# Patient Record
Sex: Female | Born: 1945
Health system: Southern US, Community
[De-identification: ages and names within clinical notes are randomized; demographics above are authoritative.]

## PROBLEM LIST (undated history)

## (undated) DIAGNOSIS — F32A Depression, unspecified: Secondary | ICD-10-CM

## (undated) DIAGNOSIS — T4145XA Adverse effect of unspecified anesthetic, initial encounter: Secondary | ICD-10-CM

## (undated) DIAGNOSIS — N952 Postmenopausal atrophic vaginitis: Secondary | ICD-10-CM

## (undated) DIAGNOSIS — Z780 Asymptomatic menopausal state: Secondary | ICD-10-CM

## (undated) DIAGNOSIS — K219 Gastro-esophageal reflux disease without esophagitis: Secondary | ICD-10-CM

## (undated) DIAGNOSIS — I209 Angina pectoris, unspecified: Secondary | ICD-10-CM

## (undated) DIAGNOSIS — R319 Hematuria, unspecified: Secondary | ICD-10-CM

## (undated) DIAGNOSIS — E785 Hyperlipidemia, unspecified: Secondary | ICD-10-CM

## (undated) DIAGNOSIS — F419 Anxiety disorder, unspecified: Secondary | ICD-10-CM

## (undated) DIAGNOSIS — Z972 Presence of dental prosthetic device (complete) (partial): Secondary | ICD-10-CM

## (undated) DIAGNOSIS — F329 Major depressive disorder, single episode, unspecified: Secondary | ICD-10-CM

## (undated) DIAGNOSIS — R3129 Other microscopic hematuria: Secondary | ICD-10-CM

## (undated) DIAGNOSIS — R0600 Dyspnea, unspecified: Secondary | ICD-10-CM

## (undated) DIAGNOSIS — I10 Essential (primary) hypertension: Secondary | ICD-10-CM

## (undated) DIAGNOSIS — T8859XA Other complications of anesthesia, initial encounter: Secondary | ICD-10-CM

## (undated) DIAGNOSIS — J309 Allergic rhinitis, unspecified: Secondary | ICD-10-CM

## (undated) HISTORY — PX: COLONOSCOPY: SHX174

## (undated) HISTORY — DX: Postmenopausal atrophic vaginitis: N95.2

## (undated) HISTORY — DX: Asymptomatic menopausal state: Z78.0

## (undated) HISTORY — DX: Allergic rhinitis, unspecified: J30.9

## (undated) HISTORY — DX: Depression, unspecified: F32.A

## (undated) HISTORY — PX: BREAST SURGERY: SHX581

## (undated) HISTORY — PX: TUMOR EXCISION: SHX421

## (undated) HISTORY — DX: Hyperlipidemia, unspecified: E78.5

## (undated) HISTORY — DX: Anxiety disorder, unspecified: F41.9

## (undated) HISTORY — DX: Essential (primary) hypertension: I10

## (undated) HISTORY — DX: Major depressive disorder, single episode, unspecified: F32.9

## (undated) HISTORY — PX: TONSILLECTOMY: SUR1361

## (undated) HISTORY — DX: Other microscopic hematuria: R31.29

---
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
## 2004-03-18 ENCOUNTER — Ambulatory Visit: Payer: Self-pay | Admitting: Gastroenterology

## 2005-04-23 ENCOUNTER — Ambulatory Visit: Payer: Self-pay | Admitting: Gastroenterology

## 2005-04-30 ENCOUNTER — Ambulatory Visit: Payer: Self-pay | Admitting: Gastroenterology

## 2005-05-01 ENCOUNTER — Ambulatory Visit: Payer: Self-pay | Admitting: Unknown Physician Specialty

## 2009-01-17 ENCOUNTER — Ambulatory Visit: Payer: Self-pay | Admitting: Internal Medicine

## 2009-01-28 ENCOUNTER — Ambulatory Visit: Payer: Self-pay | Admitting: Internal Medicine

## 2009-02-27 ENCOUNTER — Ambulatory Visit: Payer: Self-pay | Admitting: Internal Medicine

## 2011-05-12 DIAGNOSIS — R072 Precordial pain: Secondary | ICD-10-CM | POA: Diagnosis not present

## 2011-05-15 DIAGNOSIS — R0989 Other specified symptoms and signs involving the circulatory and respiratory systems: Secondary | ICD-10-CM | POA: Diagnosis not present

## 2011-05-15 DIAGNOSIS — R0789 Other chest pain: Secondary | ICD-10-CM | POA: Diagnosis not present

## 2011-05-15 DIAGNOSIS — R0609 Other forms of dyspnea: Secondary | ICD-10-CM | POA: Diagnosis not present

## 2011-07-14 DIAGNOSIS — J309 Allergic rhinitis, unspecified: Secondary | ICD-10-CM | POA: Diagnosis not present

## 2011-07-14 DIAGNOSIS — J069 Acute upper respiratory infection, unspecified: Secondary | ICD-10-CM | POA: Diagnosis not present

## 2011-09-18 DIAGNOSIS — F411 Generalized anxiety disorder: Secondary | ICD-10-CM | POA: Diagnosis not present

## 2011-09-18 DIAGNOSIS — N760 Acute vaginitis: Secondary | ICD-10-CM | POA: Diagnosis not present

## 2011-09-18 DIAGNOSIS — N390 Urinary tract infection, site not specified: Secondary | ICD-10-CM | POA: Diagnosis not present

## 2011-12-22 LAB — HM COLONOSCOPY

## 2012-01-13 DIAGNOSIS — L719 Rosacea, unspecified: Secondary | ICD-10-CM | POA: Diagnosis not present

## 2012-03-10 LAB — HM MAMMOGRAPHY

## 2012-07-28 DIAGNOSIS — L708 Other acne: Secondary | ICD-10-CM | POA: Diagnosis not present

## 2012-08-02 DIAGNOSIS — IMO0002 Reserved for concepts with insufficient information to code with codable children: Secondary | ICD-10-CM | POA: Diagnosis not present

## 2012-09-26 DIAGNOSIS — S83419A Sprain of medial collateral ligament of unspecified knee, initial encounter: Secondary | ICD-10-CM | POA: Diagnosis not present

## 2012-12-23 DIAGNOSIS — Z23 Encounter for immunization: Secondary | ICD-10-CM | POA: Diagnosis not present

## 2013-01-02 DIAGNOSIS — M2240 Chondromalacia patellae, unspecified knee: Secondary | ICD-10-CM | POA: Diagnosis not present

## 2013-01-02 DIAGNOSIS — IMO0002 Reserved for concepts with insufficient information to code with codable children: Secondary | ICD-10-CM | POA: Diagnosis not present

## 2013-01-02 DIAGNOSIS — S83419A Sprain of medial collateral ligament of unspecified knee, initial encounter: Secondary | ICD-10-CM | POA: Diagnosis not present

## 2013-01-17 ENCOUNTER — Ambulatory Visit: Payer: Self-pay | Admitting: Specialist

## 2013-01-17 DIAGNOSIS — M25569 Pain in unspecified knee: Secondary | ICD-10-CM | POA: Diagnosis not present

## 2013-01-19 DIAGNOSIS — IMO0002 Reserved for concepts with insufficient information to code with codable children: Secondary | ICD-10-CM | POA: Diagnosis not present

## 2013-01-19 DIAGNOSIS — M2240 Chondromalacia patellae, unspecified knee: Secondary | ICD-10-CM | POA: Diagnosis not present

## 2013-01-19 DIAGNOSIS — S83419A Sprain of medial collateral ligament of unspecified knee, initial encounter: Secondary | ICD-10-CM | POA: Diagnosis not present

## 2013-03-15 DIAGNOSIS — Z01419 Encounter for gynecological examination (general) (routine) without abnormal findings: Secondary | ICD-10-CM | POA: Diagnosis not present

## 2013-03-15 DIAGNOSIS — D709 Neutropenia, unspecified: Secondary | ICD-10-CM | POA: Diagnosis not present

## 2013-03-15 DIAGNOSIS — E785 Hyperlipidemia, unspecified: Secondary | ICD-10-CM | POA: Diagnosis not present

## 2013-03-15 DIAGNOSIS — Z23 Encounter for immunization: Secondary | ICD-10-CM | POA: Diagnosis not present

## 2013-03-15 DIAGNOSIS — N951 Menopausal and female climacteric states: Secondary | ICD-10-CM | POA: Diagnosis not present

## 2013-03-15 DIAGNOSIS — I10 Essential (primary) hypertension: Secondary | ICD-10-CM | POA: Diagnosis not present

## 2013-03-15 DIAGNOSIS — Z0000 Encounter for general adult medical examination without abnormal findings: Secondary | ICD-10-CM | POA: Diagnosis not present

## 2013-03-15 DIAGNOSIS — H1030 Unspecified acute conjunctivitis, unspecified eye: Secondary | ICD-10-CM | POA: Diagnosis not present

## 2013-03-15 DIAGNOSIS — F411 Generalized anxiety disorder: Secondary | ICD-10-CM | POA: Diagnosis not present

## 2013-03-15 DIAGNOSIS — Z1331 Encounter for screening for depression: Secondary | ICD-10-CM | POA: Diagnosis not present

## 2013-03-15 DIAGNOSIS — F329 Major depressive disorder, single episode, unspecified: Secondary | ICD-10-CM | POA: Diagnosis not present

## 2013-03-15 DIAGNOSIS — IMO0002 Reserved for concepts with insufficient information to code with codable children: Secondary | ICD-10-CM | POA: Diagnosis not present

## 2013-03-15 LAB — HM PAP SMEAR

## 2013-03-29 DIAGNOSIS — Z1231 Encounter for screening mammogram for malignant neoplasm of breast: Secondary | ICD-10-CM | POA: Diagnosis not present

## 2013-06-23 DIAGNOSIS — D1801 Hemangioma of skin and subcutaneous tissue: Secondary | ICD-10-CM | POA: Diagnosis not present

## 2013-06-23 DIAGNOSIS — L708 Other acne: Secondary | ICD-10-CM | POA: Diagnosis not present

## 2013-07-18 DIAGNOSIS — R002 Palpitations: Secondary | ICD-10-CM | POA: Diagnosis not present

## 2013-07-18 DIAGNOSIS — R0789 Other chest pain: Secondary | ICD-10-CM | POA: Diagnosis not present

## 2013-07-18 DIAGNOSIS — R0609 Other forms of dyspnea: Secondary | ICD-10-CM | POA: Diagnosis not present

## 2013-07-24 DIAGNOSIS — R002 Palpitations: Secondary | ICD-10-CM | POA: Diagnosis not present

## 2013-07-24 DIAGNOSIS — I4949 Other premature depolarization: Secondary | ICD-10-CM | POA: Diagnosis not present

## 2013-07-28 DIAGNOSIS — R21 Rash and other nonspecific skin eruption: Secondary | ICD-10-CM | POA: Diagnosis not present

## 2013-08-04 DIAGNOSIS — R310 Gross hematuria: Secondary | ICD-10-CM | POA: Diagnosis not present

## 2013-08-04 DIAGNOSIS — N760 Acute vaginitis: Secondary | ICD-10-CM | POA: Diagnosis not present

## 2013-08-04 DIAGNOSIS — N390 Urinary tract infection, site not specified: Secondary | ICD-10-CM | POA: Diagnosis not present

## 2013-08-28 DIAGNOSIS — G4700 Insomnia, unspecified: Secondary | ICD-10-CM | POA: Diagnosis not present

## 2013-08-28 DIAGNOSIS — N952 Postmenopausal atrophic vaginitis: Secondary | ICD-10-CM | POA: Diagnosis not present

## 2013-10-13 DIAGNOSIS — L821 Other seborrheic keratosis: Secondary | ICD-10-CM | POA: Diagnosis not present

## 2013-11-23 DIAGNOSIS — L0291 Cutaneous abscess, unspecified: Secondary | ICD-10-CM | POA: Diagnosis not present

## 2013-11-23 DIAGNOSIS — N979 Female infertility, unspecified: Secondary | ICD-10-CM | POA: Diagnosis not present

## 2013-11-23 DIAGNOSIS — L0390 Cellulitis, unspecified: Secondary | ICD-10-CM | POA: Diagnosis not present

## 2013-12-27 DIAGNOSIS — Z23 Encounter for immunization: Secondary | ICD-10-CM | POA: Diagnosis not present

## 2015-03-01 ENCOUNTER — Other Ambulatory Visit: Payer: Self-pay | Admitting: Unknown Physician Specialty

## 2015-03-01 ENCOUNTER — Encounter: Payer: Self-pay | Admitting: Unknown Physician Specialty

## 2015-03-01 ENCOUNTER — Ambulatory Visit (INDEPENDENT_AMBULATORY_CARE_PROVIDER_SITE_OTHER): Payer: Medicare Other | Admitting: Unknown Physician Specialty

## 2015-03-01 VITALS — BP 122/68 | HR 65 | Temp 98.3°F | Ht 67.5 in | Wt 159.6 lb

## 2015-03-01 DIAGNOSIS — F419 Anxiety disorder, unspecified: Secondary | ICD-10-CM

## 2015-03-01 DIAGNOSIS — I10 Essential (primary) hypertension: Secondary | ICD-10-CM | POA: Insufficient documentation

## 2015-03-01 DIAGNOSIS — F32A Depression, unspecified: Secondary | ICD-10-CM

## 2015-03-01 DIAGNOSIS — G4700 Insomnia, unspecified: Secondary | ICD-10-CM | POA: Insufficient documentation

## 2015-03-01 DIAGNOSIS — F329 Major depressive disorder, single episode, unspecified: Secondary | ICD-10-CM

## 2015-03-01 DIAGNOSIS — D709 Neutropenia, unspecified: Secondary | ICD-10-CM

## 2015-03-01 DIAGNOSIS — J309 Allergic rhinitis, unspecified: Secondary | ICD-10-CM | POA: Insufficient documentation

## 2015-03-01 DIAGNOSIS — R3129 Other microscopic hematuria: Secondary | ICD-10-CM | POA: Insufficient documentation

## 2015-03-01 DIAGNOSIS — E785 Hyperlipidemia, unspecified: Secondary | ICD-10-CM | POA: Insufficient documentation

## 2015-03-01 DIAGNOSIS — N952 Postmenopausal atrophic vaginitis: Secondary | ICD-10-CM | POA: Insufficient documentation

## 2015-03-01 DIAGNOSIS — J069 Acute upper respiratory infection, unspecified: Secondary | ICD-10-CM | POA: Diagnosis not present

## 2015-03-01 DIAGNOSIS — Z780 Asymptomatic menopausal state: Secondary | ICD-10-CM | POA: Insufficient documentation

## 2015-03-01 MED ORDER — FLUOXETINE HCL 10 MG PO CAPS: 30.0000 mg | ORAL_CAPSULE | Freq: Every day | ORAL | Status: DC

## 2015-03-01 NOTE — Patient Instructions (Signed)
Viral Infections °A viral infection can be caused by different types of viruses. Most viral infections are not serious and resolve on their own. However, some infections may cause severe symptoms and may lead to further complications. °SYMPTOMS °Viruses can frequently cause: °· Minor sore throat. °· Aches and pains. °· Headaches. °· Runny nose. °· Different types of rashes. °· Watery eyes. °· Tiredness. °· Cough. °· Loss of appetite. °· Gastrointestinal infections, resulting in nausea, vomiting, and diarrhea. °These symptoms do not respond to antibiotics because the infection is not caused by bacteria. However, you might catch a bacterial infection following the viral infection. This is sometimes called a "superinfection." Symptoms of such a bacterial infection may include: °· Worsening sore throat with pus and difficulty swallowing. °· Swollen neck glands. °· Chills and a high or persistent fever. °· Severe headache. °· Tenderness over the sinuses. °· Persistent overall ill feeling (malaise), muscle aches, and tiredness (fatigue). °· Persistent cough. °· Yellow, green, or brown mucus production with coughing. °HOME CARE INSTRUCTIONS  °· Only take over-the-counter or prescription medicines for pain, discomfort, diarrhea, or fever as directed by your caregiver. °· Drink enough water and fluids to keep your urine clear or pale yellow. Sports drinks can provide valuable electrolytes, sugars, and hydration. °· Get plenty of rest and maintain proper nutrition. Soups and broths with crackers or rice are fine. °SEEK IMMEDIATE MEDICAL CARE IF:  °· You have severe headaches, shortness of breath, chest pain, neck pain, or an unusual rash. °· You have uncontrolled vomiting, diarrhea, or you are unable to keep down fluids. °· You or your child has an oral temperature above 102° F (38.9° C), not controlled by medicine. °· Your baby is older than 3 months with a rectal temperature of 102° F (38.9° C) or higher. °· Your baby is 3  months old or younger with a rectal temperature of 100.4° F (38° C) or higher. °MAKE SURE YOU:  °· Understand these instructions. °· Will watch your condition. °· Will get help right away if you are not doing well or get worse. °  °This information is not intended to replace advice given to you by your health care provider. Make sure you discuss any questions you have with your health care provider. °  °Document Released: 12/24/2004 Document Revised: 06/08/2011 Document Reviewed: 08/22/2014 °Elsevier Interactive Patient Education ©2016 [employer] ° °

## 2015-03-01 NOTE — Assessment & Plan Note (Signed)
Stable.  Refill Fluoxetine

## 2015-03-01 NOTE — Progress Notes (Signed)
BP 122/68 mmHg  Pulse 65  Temp(Src) 98.3 F (36.8 C)  Ht 5' 7.5" (1.715 m)  Wt 159 lb 9.6 oz (72.394 kg)  BMI 24.61 kg/m2  SpO2 99%  LMP 06/30/1991 (Approximate)   Subjective:    Patient ID: Connie Morrison, female    DOB: Jan 25, 1946, 69 y.o.   MRN: [id number]  HPI: Connie Morrison is a 69 y.o. female  Chief Complaint  Patient presents with  . URI    pt states she has chest congestion and a productive cough that has been going on for about 30 days  . Medication Refill    pt states she needs refill on fluoxetine   URI  This is a new problem. Episode onset: 3 days. The problem has been gradually worsening. There has been no fever. Associated symptoms include congestion, coughing, headaches and neck pain. Pertinent negatives include no vomiting. She has tried nothing for the symptoms. The treatment provided no relief.   Depression Needs refill of Fluoxetine.   Depression screen PHQ 2/9 03/01/2015  Decreased Interest 0  Down, Depressed, Hopeless 0  PHQ - 2 Score 0     Relevant past medical, surgical, family and social history reviewed and updated as indicated. Interim medical history since our last visit reviewed. Allergies and medications reviewed and updated.  Review of Systems  HENT: Positive for congestion.   Respiratory: Positive for cough.   Gastrointestinal: Negative for vomiting.  Musculoskeletal: Positive for neck pain.  Neurological: Positive for headaches.    Per HPI unless specifically indicated above     Objective:    BP 122/68 mmHg  Pulse 65  Temp(Src) 98.3 F (36.8 C)  Ht 5' 7.5" (1.715 m)  Wt 159 lb 9.6 oz (72.394 kg)  BMI 24.61 kg/m2  SpO2 99%  LMP 06/30/1991 (Approximate)  Wt Readings from Last 3 Encounters:  03/01/15 159 lb 9.6 oz (72.394 kg)  07/13/14 161 lb (73.029 kg)    Physical Exam  Constitutional: She is oriented to person, place, and time. She appears well-developed and well-nourished. No distress.  HENT:  Head: Normocephalic and  atraumatic.  Right Ear: Tympanic membrane and ear canal normal.  Left Ear: Tympanic membrane and ear canal normal.  Nose: Rhinorrhea present. Right sinus exhibits no maxillary sinus tenderness and no frontal sinus tenderness. Left sinus exhibits no maxillary sinus tenderness and no frontal sinus tenderness.  Mouth/Throat: Mucous membranes are normal. Posterior oropharyngeal erythema present.  Eyes: Conjunctivae and lids are normal. Right eye exhibits no discharge. Left eye exhibits no discharge. No scleral icterus.  Cardiovascular: Normal rate and regular rhythm.   Pulmonary/Chest: Effort normal and breath sounds normal. No respiratory distress.  Abdominal: Normal appearance. There is no splenomegaly or hepatomegaly.  Musculoskeletal: Normal range of motion.  Neurological: She is alert and oriented to person, place, and time.  Skin: Skin is intact. No rash noted. No pallor.  Psychiatric: She has a normal mood and affect. Her behavior is normal. Judgment and thought content normal.      Assessment & Plan:   Problem List Items Addressed This Visit      Unprioritized   Depression    Stable.  Refill Fluoxetine      Relevant Medications   FLUoxetine (PROZAC) 10 MG capsule    Other Visit Diagnoses    URI (upper respiratory infection)    -  Primary        Follow up plan: Return for PE in January.

## 2015-05-27 ENCOUNTER — Encounter: Payer: Self-pay | Admitting: Family Medicine

## 2015-05-27 ENCOUNTER — Ambulatory Visit (INDEPENDENT_AMBULATORY_CARE_PROVIDER_SITE_OTHER): Payer: Medicare Other | Admitting: Family Medicine

## 2015-05-27 VITALS — BP 126/72 | HR 69 | Temp 98.2°F | Ht 66.4 in | Wt 155.0 lb

## 2015-05-27 DIAGNOSIS — J101 Influenza due to other identified influenza virus with other respiratory manifestations: Secondary | ICD-10-CM | POA: Diagnosis not present

## 2015-05-27 DIAGNOSIS — R509 Fever, unspecified: Secondary | ICD-10-CM | POA: Diagnosis not present

## 2015-05-27 LAB — INFLUENZA A AND B
INFLUENZA B AG, EIA: NEGATIVE
Influenza A Ag, EIA: POSITIVE — AB

## 2015-05-27 MED ORDER — BENZONATATE 200 MG PO CAPS: 200.0000 mg | ORAL_CAPSULE | Freq: Two times a day (BID) | ORAL | Status: DC | PRN

## 2015-05-27 MED ORDER — HYDROCOD POLST-CPM POLST ER 10-8 MG/5ML PO SUER: 5.0000 mL | Freq: Every evening | ORAL | Status: DC | PRN

## 2015-05-27 NOTE — Progress Notes (Signed)
BP 126/72 mmHg  Pulse 69  Temp(Src) 98.2 F (36.8 C)  Ht 5' 6.4" (1.687 m)  Wt 155 lb (70.308 kg)  BMI 24.70 kg/m2  SpO2 97%  LMP 06/30/1991 (Approximate)   Subjective:    Patient ID: Connie Morrison, female    DOB: [birth year], 70 y.o.   MRN: [id number]  HPI: CHRISTANY BELLER is a 70 y.o. female  Chief Complaint  Patient presents with  . Cough    X 6 days, fever, fatigue, and shortness of breath   UPPER RESPIRATORY TRACT INFECTION Duration: 6 days Worst symptom: tightness in chest and SOB Fever: yes Cough: yes Shortness of breath: yes Wheezing: yes Chest pain: no Chest tightness: yes Chest congestion: yes Nasal congestion: no Runny nose: no Post nasal drip: yes Sneezing: no Sore throat: no Swollen glands: no Sinus pressure: no Headache: yes Face pain: no Toothache: no Ear pain: no  Ear pressure: no  Eyes red/itching:no Eye drainage/crusting: no  Vomiting: no Rash: no Fatigue: yes Sick contacts: yes Strep contacts: no  Context: better Recurrent sinusitis: no Relief with OTC cold/cough medications: no  Treatments attempted: none   Relevant past medical, surgical, family and social history reviewed and updated as indicated. Interim medical history since our last visit reviewed. Allergies and medications reviewed and updated.  Review of Systems  Constitutional: Positive for fever, chills and fatigue. Negative for diaphoresis, activity change, appetite change and unexpected weight change.  HENT: Positive for congestion, rhinorrhea and sinus pressure. Negative for dental problem, drooling, ear discharge, ear pain, facial swelling, hearing loss, mouth sores, nosebleeds, postnasal drip, sneezing, sore throat, tinnitus and trouble swallowing.   Respiratory: Positive for cough, chest tightness, shortness of breath and wheezing. Negative for choking and stridor.   Cardiovascular: Negative.   Hematological: Negative.   Psychiatric/Behavioral: Negative.     Per HPI  unless specifically indicated above     Objective:    BP 126/72 mmHg  Pulse 69  Temp(Src) 98.2 F (36.8 C)  Ht 5' 6.4" (1.687 m)  Wt 155 lb (70.308 kg)  BMI 24.70 kg/m2  SpO2 97%  LMP 06/30/1991 (Approximate)  Wt Readings from Last 3 Encounters:  05/27/15 155 lb (70.308 kg)  03/01/15 159 lb 9.6 oz (72.394 kg)  07/13/14 161 lb (73.029 kg)    Physical Exam  Constitutional: She is oriented to person, place, and time. She appears well-developed and well-nourished. No distress.  HENT:  Head: Normocephalic and atraumatic.  Right Ear: Hearing and external ear normal.  Left Ear: Hearing and external ear normal.  Nose: Nose normal.  Mouth/Throat: Oropharynx is clear and moist. No oropharyngeal exudate.  Eyes: Conjunctivae, EOM and lids are normal. Pupils are equal, round, and reactive to light. Right eye exhibits no discharge. Left eye exhibits no discharge. No scleral icterus.  Neck: Normal range of motion. Neck supple. No JVD present. No tracheal deviation present. No thyromegaly present.  Cardiovascular: Normal rate, regular rhythm, normal heart sounds and intact distal pulses.  Exam reveals no gallop and no friction rub.   No murmur heard. Pulmonary/Chest: Effort normal and breath sounds normal. No stridor. No respiratory distress. She has no wheezes. She has no rales. She exhibits no tenderness.  Musculoskeletal: Normal range of motion.  Lymphadenopathy:    She has cervical adenopathy.  Neurological: She is alert and oriented to person, place, and time.  Skin: Skin is warm, dry and intact. No rash noted. She is not diaphoretic. No erythema. No pallor.  Psychiatric: She has a  normal mood and affect. Her speech is normal and behavior is normal. Judgment and thought content normal. Cognition and memory are normal.  Nursing note and vitals reviewed.   Results for orders placed or performed in visit on 03/01/15  HM MAMMOGRAPHY  Result Value Ref Range   HM Mammogram from PP   HM  PAP SMEAR  Result Value Ref Range   HM Pap smear from PP   HM COLONOSCOPY  Result Value Ref Range   HM Colonoscopy from PP       Assessment & Plan:   Problem List Items Addressed This Visit    None    Visit Diagnoses    Influenza A    -  Primary    Too late for tamiflu. No sign of pneumonia. Will treat with tussionex and tessalon perles. Monitor. Let us know if not getting better or getting worse.     Fever and chills        + for influenza A    Relevant Orders    Influenza a and b        Follow up plan: Return if symptoms worsen or fail to improve.

## 2015-07-11 ENCOUNTER — Telehealth: Payer: Self-pay | Admitting: Unknown Physician Specialty

## 2015-07-11 MED ORDER — FLUCONAZOLE 150 MG PO TABS: 150.0000 mg | ORAL_TABLET | Freq: Once | ORAL | Status: DC

## 2015-07-11 NOTE — Telephone Encounter (Signed)
Routing to provider  

## 2015-07-11 NOTE — Telephone Encounter (Signed)
Pt would like to have something called in to [facility] for a yeast infection.

## 2015-07-19 ENCOUNTER — Encounter: Payer: Self-pay | Admitting: Unknown Physician Specialty

## 2015-07-19 ENCOUNTER — Ambulatory Visit (INDEPENDENT_AMBULATORY_CARE_PROVIDER_SITE_OTHER): Payer: Medicare Other | Admitting: Unknown Physician Specialty

## 2015-07-19 VITALS — BP 118/68 | HR 62 | Temp 97.6°F | Ht 66.2 in | Wt 155.2 lb

## 2015-07-19 DIAGNOSIS — I10 Essential (primary) hypertension: Secondary | ICD-10-CM | POA: Diagnosis not present

## 2015-07-19 DIAGNOSIS — M542 Cervicalgia: Secondary | ICD-10-CM | POA: Diagnosis not present

## 2015-07-19 DIAGNOSIS — Z0000 Encounter for general adult medical examination without abnormal findings: Secondary | ICD-10-CM | POA: Diagnosis not present

## 2015-07-19 DIAGNOSIS — E785 Hyperlipidemia, unspecified: Secondary | ICD-10-CM | POA: Diagnosis not present

## 2015-07-19 DIAGNOSIS — Z1239 Encounter for other screening for malignant neoplasm of breast: Secondary | ICD-10-CM | POA: Diagnosis not present

## 2015-07-19 DIAGNOSIS — R3129 Other microscopic hematuria: Secondary | ICD-10-CM

## 2015-07-19 NOTE — Progress Notes (Addendum)
BP 118/68 mmHg  Pulse 62  Temp(Src) 97.6 F (36.4 C)  Ht 5' 6.2" (1.681 m)  Wt 155 lb 3.2 oz (70.398 kg)  BMI 24.91 kg/m2  SpO2 98%  LMP 06/30/1991 (Approximate)   Subjective:    Patient ID: Connie Morrison, female    DOB: 20-Oct-1945, 70 y.o.   MRN: [id number]  HPI: Connie Morrison is a 70 y.o. female  Chief Complaint  Patient presents with  . Medicare Wellness   Functional Status Survey: Is the patient deaf or have difficulty hearing?: No Does the patient have difficulty seeing, even when wearing glasses/contacts?: No Does the patient have difficulty concentrating, remembering, or making decisions?: No Does the patient have difficulty walking or climbing stairs?: No Does the patient have difficulty dressing or bathing?: No Does the patient have difficulty doing errands alone such as visiting a doctor's office or shopping?: No  Depression screen [facility] 2/9 07/19/2015 03/01/2015  Decreased Interest 0 0  Down, Depressed, Hopeless 0 0  PHQ - 2 Score 0 0    Family History  Problem Relation Age of Onset  . Heart disease Father   . Cancer Sister     lung  . Cancer Brother     colon  . Diabetes Brother   . Stroke Maternal Grandmother   . Cancer Maternal Grandfather     lung  . Cancer Sister     lung  . COPD Sister   . Heart disease Brother    Past Surgical History  Procedure Laterality Date  . Tumor excision Right     kidney  . Breast surgery      cyst removed  . Tonsillectomy     Neck pain Pt having neck pain for a while but worse in the past 2 months.  No arm pain.  13 treatments with chiropractor.  Wants to be referred to PT.   Hypertension Using medications without difficulty  No problems or lightheadedness No chest pain with exertion or shortness of breath No Edema   Hyperlipidemia On no medications No Muscle aches     Relevant past medical, surgical, family and social history reviewed and updated as indicated. Interim medical history since our last  visit reviewed. Allergies and medications reviewed and updated.  Review of Systems  Constitutional: Negative.   HENT: Negative.   Respiratory: Negative.   Cardiovascular: Negative.   Gastrointestinal: Negative.   Endocrine: Negative.   Genitourinary: Negative.  + Musculoskeletal: Negative.        Neck pain  Skin: Negative.   Allergic/Immunologic: Negative.   Neurological: Negative.   Hematological: Negative.   Psychiatric/Behavioral: Negative.     Per HPI unless specifically indicated above     Objective:    BP 118/68 mmHg  Pulse 62  Temp(Src) 97.6 F (36.4 C)  Ht 5' 6.2" (1.681 m)  Wt 155 lb 3.2 oz (70.398 kg)  BMI 24.91 kg/m2  SpO2 98%  LMP 06/30/1991 (Approximate)  Wt Readings from Last 3 Encounters:  07/19/15 155 lb 3.2 oz (70.398 kg)  05/27/15 155 lb (70.308 kg)  03/01/15 159 lb 9.6 oz (72.394 kg)    Physical Exam  Constitutional: She is oriented to person, place, and time. She appears well-developed and well-nourished.  HENT:  Head: Normocephalic and atraumatic.  Eyes: Pupils are equal, round, and reactive to light. Right eye exhibits no discharge. Left eye exhibits no discharge. No scleral icterus.  Neck: Normal carotid pulses present. Muscular tenderness present. No spinous process tenderness present. Carotid  bruit is not present. Decreased range of motion present. No thyromegaly present.  Cardiovascular: Normal rate, regular rhythm and normal heart sounds.  Exam reveals no gallop and no friction rub.   No murmur heard. Pulmonary/Chest: Effort normal and breath sounds normal. No respiratory distress. She has no wheezes. She has no rales.  Abdominal: Soft. Bowel sounds are normal. There is no tenderness. There is no rebound.  Genitourinary: No breast swelling, tenderness or discharge.  Lymphadenopathy:    She has no cervical adenopathy.  Neurological: She is alert and oriented to person, place, and time.  Skin: Skin is warm, dry and intact. No rash noted.   Psychiatric: She has a normal mood and affect. Her speech is normal and behavior is normal. Judgment and thought content normal. Cognition and memory are normal.       Assessment & Plan:   Problem List Items Addressed This Visit      Unprioritized   Hyperlipidemia   Relevant Orders   Lipid Panel w/o Chol/HDL Ratio   Hypertension    Stable, continue present medications.        Microscopic hematuria   Relevant Orders   Comprehensive metabolic panel   UA/M w/rflx Culture, Routine   CBC with Differential/Platelet    Other Visit Diagnoses    Health care maintenance    -  Primary    Relevant Orders    Hepatitis C antibody    Breast cancer screening        Relevant Orders    MM DIGITAL SCREENING BILATERAL    Neck pain        Relevant Orders    Ambulatory referral to Physical Therapy        Follow up plan: Return if symptoms worsen or fail to improve.

## 2015-07-19 NOTE — Assessment & Plan Note (Signed)
Stable, continue present medications.   

## 2015-07-20 LAB — COMPREHENSIVE METABOLIC PANEL
ALBUMIN: 4 g/dL (ref 3.6–4.8)
ALK PHOS: 62 IU/L (ref 39–117)
ALT: 13 IU/L (ref 0–32)
AST: 21 IU/L (ref 0–40)
Albumin/Globulin Ratio: 1.6 (ref 1.2–2.2)
BILIRUBIN TOTAL: 0.5 mg/dL (ref 0.0–1.2)
BUN / CREAT RATIO: 17 (ref 12–28)
BUN: 15 mg/dL (ref 8–27)
CHLORIDE: 102 mmol/L (ref 96–106)
CO2: 25 mmol/L (ref 18–29)
CREATININE: 0.87 mg/dL (ref 0.57–1.00)
Calcium: 9.2 mg/dL (ref 8.7–10.3)
GFR calc Af Amer: 79 mL/min/{1.73_m2} (ref >59)
GFR calc non Af Amer: 68 mL/min/{1.73_m2} (ref >59)
GLUCOSE: 89 mg/dL (ref 65–99)
Globulin, Total: 2.5 g/dL (ref 1.5–4.5)
Potassium: 4.2 mmol/L (ref 3.5–5.2)
Sodium: 144 mmol/L (ref 134–144)
Total Protein: 6.5 g/dL (ref 6.0–8.5)

## 2015-07-20 LAB — LIPID PANEL W/O CHOL/HDL RATIO
CHOLESTEROL TOTAL: 231 mg/dL — AB (ref 100–199)
HDL: 54 mg/dL (ref >39)
LDL CALC: 149 mg/dL — AB (ref 0–99)
Triglycerides: 141 mg/dL (ref 0–149)
VLDL CHOLESTEROL CAL: 28 mg/dL (ref 5–40)

## 2015-07-20 LAB — CBC WITH DIFFERENTIAL/PLATELET
BASOS ABS: 0 10*3/uL (ref 0.0–0.2)
Basos: 1 %
EOS (ABSOLUTE): 0.2 10*3/uL (ref 0.0–0.4)
Eos: 5 %
HEMOGLOBIN: 13 g/dL (ref 11.1–15.9)
Hematocrit: 40 % (ref 34.0–46.6)
IMMATURE GRANULOCYTES: 0 %
Immature Grans (Abs): 0 10*3/uL (ref 0.0–0.1)
LYMPHS ABS: 1.6 10*3/uL (ref 0.7–3.1)
Lymphs: 41 %
MCH: 29.9 pg (ref 26.6–33.0)
MCHC: 32.5 g/dL (ref 31.5–35.7)
MCV: 92 fL (ref 79–97)
MONOCYTES: 15 %
Monocytes Absolute: 0.6 10*3/uL (ref 0.1–0.9)
Neutrophils Absolute: 1.4 10*3/uL (ref 1.4–7.0)
Neutrophils: 38 %
Platelets: 218 10*3/uL (ref 150–379)
RBC: 4.35 x10E6/uL (ref 3.77–5.28)
RDW: 14.5 % (ref 12.3–15.4)
WBC: 3.8 10*3/uL (ref 3.4–10.8)

## 2015-07-20 LAB — HEPATITIS C ANTIBODY

## 2015-07-21 LAB — URINE CULTURE, REFLEX

## 2015-07-21 LAB — UA/M W/RFLX CULTURE, ROUTINE
BILIRUBIN UA: NEGATIVE
GLUCOSE, UA: NEGATIVE
Nitrite, UA: NEGATIVE
SPEC GRAV UA: 1.02 (ref 1.005–1.030)
Urobilinogen, Ur: 1 mg/dL (ref 0.2–1.0)
pH, UA: 6 (ref 5.0–7.5)

## 2015-07-21 LAB — MICROSCOPIC EXAMINATION

## 2015-07-22 ENCOUNTER — Telehealth: Payer: Self-pay | Admitting: Unknown Physician Specialty

## 2015-07-22 MED ORDER — ATORVASTATIN CALCIUM 10 MG PO TABS: 10.0000 mg | ORAL_TABLET | Freq: Every day | ORAL | Status: DC

## 2015-07-22 NOTE — Telephone Encounter (Signed)
Discussed pt cholesterol levels.  Results of ASCVD calculator recommends statins.  Start Atorvastatin 10 mg.  Recheck in 3 months

## 2015-07-24 ENCOUNTER — Telehealth: Payer: Self-pay | Admitting: Unknown Physician Specialty

## 2015-07-24 NOTE — Telephone Encounter (Signed)
Labs printed and put in mail to send out to patient.

## 2015-07-24 NOTE — Telephone Encounter (Signed)
Pt called stated she would like a copy of her lab work from her last appt mailed to her please. Thanks.

## 2015-08-08 ENCOUNTER — Telehealth: Payer: Self-pay | Admitting: Unknown Physician Specialty

## 2015-08-08 MED ORDER — SIMVASTATIN 20 MG PO TABS: 20.0000 mg | ORAL_TABLET | Freq: Every day | ORAL | Status: DC

## 2015-08-08 NOTE — Telephone Encounter (Signed)
Yes, written and sent

## 2015-08-08 NOTE — Telephone Encounter (Signed)
Called pt to schedule follow up, pt stated her lips start to swell and she is breaking out in a rash around the top lip. This is really uncomfortable, this has happened twice since she started the Lipitor. Please call pt back to discuss this ASAP. Thanks.

## 2015-08-08 NOTE — Telephone Encounter (Signed)
Called and spoke to patient. She stated that ever since she started the atorvastatin, her lips have swelled and had a rash around them. She stated this is very uncomfortable. She wants to know if she can be switched to something else like zocor.

## 2015-10-24 ENCOUNTER — Ambulatory Visit: Payer: Medicare Other | Admitting: Family Medicine

## 2015-10-24 ENCOUNTER — Other Ambulatory Visit: Payer: Self-pay

## 2015-11-25 ENCOUNTER — Telehealth: Payer: Self-pay

## 2015-11-25 NOTE — Telephone Encounter (Signed)
Called and scheduled patient an appointment for 12/27/15.

## 2015-11-25 NOTE — Telephone Encounter (Signed)
I would prefer a visit.  Thanks

## 2015-11-25 NOTE — Telephone Encounter (Signed)
Patient called in and stated that she needs to come in for labs since starting new cholesterol medication. Does patient need a lab appointment or an actual office visit?

## 2015-12-27 ENCOUNTER — Ambulatory Visit (INDEPENDENT_AMBULATORY_CARE_PROVIDER_SITE_OTHER): Payer: Medicare Other | Admitting: Unknown Physician Specialty

## 2015-12-27 ENCOUNTER — Encounter: Payer: Self-pay | Admitting: Unknown Physician Specialty

## 2015-12-27 VITALS — BP 125/70 | HR 58 | Temp 97.5°F | Ht 67.0 in | Wt 160.0 lb

## 2015-12-27 DIAGNOSIS — Z23 Encounter for immunization: Secondary | ICD-10-CM

## 2015-12-27 DIAGNOSIS — Z5181 Encounter for therapeutic drug level monitoring: Secondary | ICD-10-CM | POA: Diagnosis not present

## 2015-12-27 DIAGNOSIS — E785 Hyperlipidemia, unspecified: Secondary | ICD-10-CM

## 2015-12-27 LAB — LIPID PANEL PICCOLO, WAIVED
Chol/HDL Ratio Piccolo,Waive: 2.6 mg/dL
Cholesterol Piccolo, Waived: 143 mg/dL (ref <200)
HDL Chol Piccolo, Waived: 56 mg/dL — ABNORMAL LOW (ref >59)
LDL Chol Calc Piccolo Waived: 45 mg/dL (ref <100)
Triglycerides Piccolo,Waived: 214 mg/dL — ABNORMAL HIGH (ref <150)
VLDL CHOL CALC PICCOLO,WAIVE: 43 mg/dL — AB (ref <30)

## 2015-12-27 NOTE — Progress Notes (Signed)
BP 125/70 (BP Location: Left Arm, Patient Position: Sitting, Cuff Size: Normal)   Pulse (!) 58   Temp 97.5 F (36.4 C)   Ht 5\' 7"  (1.702 m)   Wt 160 lb (72.6 kg)   LMP 06/30/1991 (Approximate)   SpO2 97%   BMI 25.06 kg/m    Subjective:    Patient ID: Connie Morrison, female    DOB: November 14, 1945, 70 y.o.   MRN: [id number]  HPI: BIANCIA Morrison is a 70 y.o. female  Chief Complaint  Patient presents with  . Hyperlipidemia    Hyperlipidemia Using medications without problems: No Muscle aches  Diet compliance: eating oatmeal Exercise: Goes to the gym    Relevant past medical, surgical, family and social history reviewed and updated as indicated. Interim medical history since our last visit reviewed. Allergies and medications reviewed and updated.  Review of Systems  Per HPI unless specifically indicated above     Objective:    BP 125/70 (BP Location: Left Arm, Patient Position: Sitting, Cuff Size: Normal)   Pulse (!) 58   Temp 97.5 F (36.4 C)   Ht 5\' 7"  (1.702 m)   Wt 160 lb (72.6 kg)   LMP 06/30/1991 (Approximate)   SpO2 97%   BMI 25.06 kg/m   Wt Readings from Last 3 Encounters:  12/27/15 160 lb (72.6 kg)  07/19/15 155 lb 3.2 oz (70.4 kg)  05/27/15 155 lb (70.3 kg)    Physical Exam  Constitutional: She is oriented to person, place, and time. She appears well-developed and well-nourished. No distress.  HENT:  Head: Normocephalic and atraumatic.  Eyes: Conjunctivae and lids are normal. Right eye exhibits no discharge. Left eye exhibits no discharge. No scleral icterus.  Neck: Normal range of motion. Neck supple. No JVD present. Carotid bruit is not present.  Cardiovascular: Normal rate, regular rhythm and normal heart sounds.   Pulmonary/Chest: Effort normal and breath sounds normal.  Abdominal: Normal appearance. There is no splenomegaly or hepatomegaly.  Musculoskeletal: Normal range of motion.  Neurological: She is alert and oriented to person, place, and  time.  Skin: Skin is warm, dry and intact. No rash noted. No pallor.  Psychiatric: She has a normal mood and affect. Her behavior is normal. Judgment and thought content normal.    Results for orders placed or performed in visit on 07/19/15  Microscopic Examination  Result Value Ref Range   WBC, UA 0-5 0 - 5 /hpf   RBC, UA 3-10 (A) 0 - 2 /hpf   Epithelial Cells (non renal) 0-10 0 - 10 /hpf   Mucus, UA Present Not Estab.   Bacteria, UA Few None seen/Few  Hepatitis C antibody  Result Value Ref Range   Hep C Virus Ab <0.1 0.0 - 0.9 s/co ratio  Lipid Panel w/o Chol/HDL Ratio  Result Value Ref Range   Cholesterol, Total 231 (H) 100 - 199 mg/dL   Triglycerides 141 0 - 149 mg/dL   HDL 54 >39 mg/dL   VLDL Cholesterol Cal 28 5 - 40 mg/dL   LDL Calculated 149 (H) 0 - 99 mg/dL  Comprehensive metabolic panel  Result Value Ref Range   Glucose 89 65 - 99 mg/dL   BUN 15 8 - 27 mg/dL   Creatinine, Ser 0.87 0.57 - 1.00 mg/dL   GFR calc non Af Amer 68 >59 mL/min/1.73   GFR calc Af Amer 79 >59 mL/min/1.73   BUN/Creatinine Ratio 17 12 - 28   Sodium 144 134 - 144  mmol/L   Potassium 4.2 3.5 - 5.2 mmol/L   Chloride 102 96 - 106 mmol/L   CO2 25 18 - 29 mmol/L   Calcium 9.2 8.7 - 10.3 mg/dL   Total Protein 6.5 6.0 - 8.5 g/dL   Albumin 4.0 3.6 - 4.8 g/dL   Globulin, Total 2.5 1.5 - 4.5 g/dL   Albumin/Globulin Ratio 1.6 1.2 - 2.2   Bilirubin Total 0.5 0.0 - 1.2 mg/dL   Alkaline Phosphatase 62 39 - 117 IU/L   AST 21 0 - 40 IU/L   ALT 13 0 - 32 IU/L  UA/M w/rflx Culture, Routine  Result Value Ref Range   Specific Gravity, UA 1.020 1.005 - 1.030   pH, UA 6.0 5.0 - 7.5   Color, UA Yellow Yellow   Appearance Ur Clear Clear   Leukocytes, UA Trace (A) Negative   Protein, UA 1+ (A) Negative/Trace   Glucose, UA Negative Negative   Ketones, UA Trace (A) Negative   RBC, UA 3+ (A) Negative   Bilirubin, UA Negative Negative   Urobilinogen, Ur 1.0 0.2 - 1.0 mg/dL   Nitrite, UA Negative Negative    Microscopic Examination See below:    Urinalysis Reflex Comment   CBC with Differential/Platelet  Result Value Ref Range   WBC 3.8 3.4 - 10.8 x10E3/uL   RBC 4.35 3.77 - 5.28 x10E6/uL   Hemoglobin 13.0 11.1 - 15.9 g/dL   Hematocrit 40.0 34.0 - 46.6 %   MCV 92 79 - 97 fL   MCH 29.9 26.6 - 33.0 pg   MCHC 32.5 31.5 - 35.7 g/dL   RDW 14.5 12.3 - 15.4 %   Platelets 218 150 - 379 x10E3/uL   Neutrophils 38 %   Lymphs 41 %   Monocytes 15 %   Eos 5 %   Basos 1 %   Neutrophils Absolute 1.4 1.4 - 7.0 x10E3/uL   Lymphocytes Absolute 1.6 0.7 - 3.1 x10E3/uL   Monocytes Absolute 0.6 0.1 - 0.9 x10E3/uL   EOS (ABSOLUTE) 0.2 0.0 - 0.4 x10E3/uL   Basophils Absolute 0.0 0.0 - 0.2 x10E3/uL   Immature Granulocytes 0 %   Immature Grans (Abs) 0.0 0.0 - 0.1 x10E3/uL  Urine Culture, Routine  Result Value Ref Range   Urine Culture, Routine Final report    Urine Culture result 1 Comment       Assessment & Plan:   Problem List Items Addressed This Visit      Unprioritized   Hyperlipidemia    LDL is 45.  Continue present medications but break in half for 10 mg      Relevant Orders   Comprehensive metabolic panel   Lipid Panel [place], [place]    Other Visit Diagnoses    Need for influenza vaccination    -  Primary   Relevant Orders   Flu vaccine HIGH DOSE PF (Completed)   Medication monitoring encounter           Follow up plan: Return in about 6 months (around 06/25/2016) for physical.

## 2015-12-27 NOTE — Assessment & Plan Note (Signed)
LDL is 45.  Continue present medications but break in half for 10 mg

## 2015-12-27 NOTE — Patient Instructions (Signed)

## 2015-12-28 LAB — COMPREHENSIVE METABOLIC PANEL
A/G RATIO: 1.7 (ref 1.2–2.2)
ALBUMIN: 3.8 g/dL (ref 3.5–4.8)
ALK PHOS: 59 IU/L (ref 39–117)
ALT: 16 IU/L (ref 0–32)
AST: 19 IU/L (ref 0–40)
BILIRUBIN TOTAL: 0.3 mg/dL (ref 0.0–1.2)
BUN / CREAT RATIO: 21 (ref 12–28)
BUN: 16 mg/dL (ref 8–27)
CHLORIDE: 104 mmol/L (ref 96–106)
CO2: 29 mmol/L (ref 18–29)
Calcium: 8.7 mg/dL (ref 8.7–10.3)
Creatinine, Ser: 0.78 mg/dL (ref 0.57–1.00)
GFR calc Af Amer: 89 mL/min/{1.73_m2} (ref >59)
GFR calc non Af Amer: 77 mL/min/{1.73_m2} (ref >59)
GLOBULIN, TOTAL: 2.3 g/dL (ref 1.5–4.5)
Glucose: 87 mg/dL (ref 65–99)
POTASSIUM: 4.1 mmol/L (ref 3.5–5.2)
SODIUM: 144 mmol/L (ref 134–144)
Total Protein: 6.1 g/dL (ref 6.0–8.5)

## 2015-12-30 ENCOUNTER — Encounter: Payer: Self-pay | Admitting: Unknown Physician Specialty

## 2016-03-10 ENCOUNTER — Ambulatory Visit: Payer: Medicare Other | Admitting: Unknown Physician Specialty

## 2016-03-31 ENCOUNTER — Telehealth: Payer: Self-pay

## 2016-03-31 MED ORDER — FLUOXETINE HCL 10 MG PO CAPS: 30.0000 mg | ORAL_CAPSULE | Freq: Every day | ORAL | 12 refills | Status: DC

## 2016-03-31 NOTE — Telephone Encounter (Signed)
Received a refill for patient's fluoxetine. Current rx has 2 different directions on it so I called the patient to clarify. She stated that the prescription is written for 3 capsules daily but she sometimes only takes 2 per day. Malachy Mood, can we fix the rx to say only the 3 capsules per dau and send it in for the patient?

## 2016-04-21 ENCOUNTER — Telehealth: Payer: Self-pay | Admitting: Unknown Physician Specialty

## 2016-04-21 DIAGNOSIS — E2839 Other primary ovarian failure: Secondary | ICD-10-CM

## 2016-04-21 NOTE — Telephone Encounter (Signed)
Routing to provider for order. Please route back to me so I can schedule patient's appointment.

## 2016-04-21 NOTE — Telephone Encounter (Signed)
Pt would like to have a bone density scan Feb or Mar preferred afternoon.

## 2016-04-21 NOTE — Telephone Encounter (Signed)
Called and spoke with patient. I let her know that I was going to call and schedule her bone density for her first thing in the morning being that it is now after 5. I let her know that I would call her tomorrow with her appointment.

## 2016-04-22 NOTE — Telephone Encounter (Signed)
Called and let patient know about appointment. Also gave the patient [facility] number incase she needed to change her appointment.

## 2016-04-22 NOTE — Telephone Encounter (Signed)
Called [facility] and scheduled patient's bone density for March 8th at 2:20 pm. Will call and notify patient.

## 2016-06-04 ENCOUNTER — Ambulatory Visit
Admission: RE | Admit: 2016-06-04 | Discharge: 2016-06-04 | Disposition: A | Discharge status: Home / Self Care | Payer: Medicare Other | Source: Ambulatory Visit | Attending: Unknown Physician Specialty | Admitting: Unknown Physician Specialty

## 2016-06-04 DIAGNOSIS — E2839 Other primary ovarian failure: Secondary | ICD-10-CM | POA: Diagnosis present

## 2016-06-04 DIAGNOSIS — M85851 Other specified disorders of bone density and structure, right thigh: Secondary | ICD-10-CM | POA: Insufficient documentation

## 2016-06-05 ENCOUNTER — Encounter: Payer: Self-pay | Admitting: Unknown Physician Specialty

## 2016-06-05 NOTE — Progress Notes (Signed)
Normal labs.  Pt notified by letter

## 2016-07-24 ENCOUNTER — Encounter: Payer: Self-pay | Admitting: Unknown Physician Specialty

## 2016-07-24 ENCOUNTER — Ambulatory Visit (INDEPENDENT_AMBULATORY_CARE_PROVIDER_SITE_OTHER): Payer: Medicare Other | Admitting: Unknown Physician Specialty

## 2016-07-24 VITALS — BP 122/69 | HR 62 | Temp 97.8°F | Ht 66.2 in | Wt 158.2 lb

## 2016-07-24 DIAGNOSIS — Z0000 Encounter for general adult medical examination without abnormal findings: Secondary | ICD-10-CM

## 2016-07-24 DIAGNOSIS — K219 Gastro-esophageal reflux disease without esophagitis: Secondary | ICD-10-CM | POA: Diagnosis not present

## 2016-07-24 DIAGNOSIS — M542 Cervicalgia: Secondary | ICD-10-CM

## 2016-07-24 DIAGNOSIS — Z0001 Encounter for general adult medical examination with abnormal findings: Secondary | ICD-10-CM | POA: Diagnosis not present

## 2016-07-24 DIAGNOSIS — I10 Essential (primary) hypertension: Secondary | ICD-10-CM | POA: Diagnosis not present

## 2016-07-24 DIAGNOSIS — E7800 Pure hypercholesterolemia, unspecified: Secondary | ICD-10-CM

## 2016-07-24 DIAGNOSIS — F325 Major depressive disorder, single episode, in full remission: Secondary | ICD-10-CM | POA: Diagnosis not present

## 2016-07-24 DIAGNOSIS — Z1231 Encounter for screening mammogram for malignant neoplasm of breast: Secondary | ICD-10-CM | POA: Diagnosis not present

## 2016-07-24 DIAGNOSIS — Z7189 Other specified counseling: Secondary | ICD-10-CM

## 2016-07-24 MED ORDER — OMEPRAZOLE 20 MG PO CPDR: 20.0000 mg | DELAYED_RELEASE_CAPSULE | Freq: Every day | ORAL | 3 refills | Status: DC

## 2016-07-24 NOTE — Assessment & Plan Note (Signed)
Stable, continue present medications.   

## 2016-07-24 NOTE — Assessment & Plan Note (Signed)
New problem.  Start Omeprazole.

## 2016-07-24 NOTE — Assessment & Plan Note (Signed)
A voluntary discussion about advance care planning including the explanation and discussion of advance directives was extensively discussed  with the patient.  Explanation about the health care proxy and Living will was reviewed.  She has done the forms and does not want to make changes.  She identifies her health care proxy as her husband.

## 2016-07-24 NOTE — Progress Notes (Signed)
BP 122/69   Pulse 62   Temp 97.8 F (36.6 C)   Ht 5' 6.2" (1.681 m)   Wt 158 lb 3.2 oz (71.8 kg)   LMP 06/30/1991 (Approximate)   SpO2 99%   BMI 25.38 kg/m    Subjective:    Patient ID: Connie Morrison, female    DOB: 1945/08/28, 71 y.o.   MRN: [id number]  HPI: Connie Morrison is a 71 y.o. female  Chief Complaint  Patient presents with  . Medicare Wellness   GERD States she needs something for acid indigestion.  States she gets food coming up into her throat.  Uses OTC Alka-Seltzer.  Husband takes Omeprazole.    Hypertension Using medications without difficulty Average home BPs   No problems or lightheadedness No chest pain with exertion or shortness of breath No Edema  Hyperlipidemia Using medications without problems: No Muscle aches  Diet compliance: Exercise:  Neck pain Pt with long-standing neck pain.  She would like to go to PT as previously recommended.    Depression Doing well with present medications.   Depression screen [facility] 2/9 07/24/2016 07/19/2015 03/01/2015  Decreased Interest 0 0 0  Down, Depressed, Hopeless 0 0 0  PHQ - 2 Score 0 0 0  Altered sleeping 1 - -  Tired, decreased energy 1 - -  Change in appetite 1 - -  Feeling bad or failure about yourself  0 - -  Trouble concentrating 0 - -  Moving slowly or fidgety/restless 0 - -  Suicidal thoughts 0 - -  PHQ-9 Score 3 - -   Functional Status Survey: Is the patient deaf or have difficulty hearing?: No Does the patient have difficulty seeing, even when wearing glasses/contacts?: No Does the patient have difficulty concentrating, remembering, or making decisions?: No Does the patient have difficulty walking or climbing stairs?: No Does the patient have difficulty dressing or bathing?: No Does the patient have difficulty doing errands alone such as visiting a doctor's office or shopping?: No Fall Risk  07/24/2016 07/19/2015  Falls in the past year? No No   Social History   Social History  .  Marital status: Married    Spouse name: N/A  . Number of children: N/A  . Years of education: N/A   Occupational History  . Not on file.   Social History Main Topics  . Smoking status: Former Research scientist (life sciences)  . Smokeless tobacco: Never Used  . Alcohol use No  . Drug use: No  . Sexual activity: Yes   Other Topics Concern  . Not on file   Social History Narrative  . No narrative on file   Family History  Problem Relation Age of Onset  . Heart disease Father   . Cancer Sister     lung  . Cancer Brother     colon  . Diabetes Brother   . Stroke Maternal Grandmother   . Cancer Maternal Grandfather     lung  . Cancer Sister     lung  . COPD Sister   . Heart disease Brother    Past Medical History:  Diagnosis Date  . Allergic rhinitis   . Anxiety   . Depression   . Hyperlipidemia   . Hypertension   . Menopause   . Microscopic hematuria   . Vaginal atrophy    Past Surgical History:  Procedure Laterality Date  . BREAST SURGERY     cyst removed  . TONSILLECTOMY    . TUMOR EXCISION Right  kidney    Relevant past medical, surgical, family and social history reviewed and updated as indicated. Interim medical history since our last visit reviewed. Allergies and medications reviewed and updated.  Review of Systems  Constitutional: Negative.   HENT: Negative.   Eyes: Negative.   Respiratory: Negative.   Cardiovascular: Negative.   Endocrine: Negative.   Genitourinary: Negative.   Musculoskeletal: Negative.   Skin: Negative.   Allergic/Immunologic: Negative.   Neurological: Negative.   Hematological: Negative.   Psychiatric/Behavioral: Negative.     Per HPI unless specifically indicated above     Objective:    BP 122/69   Pulse 62   Temp 97.8 F (36.6 C)   Ht 5' 6.2" (1.681 m)   Wt 158 lb 3.2 oz (71.8 kg)   LMP 06/30/1991 (Approximate)   SpO2 99%   BMI 25.38 kg/m   Wt Readings from Last 3 Encounters:  07/24/16 158 lb 3.2 oz (71.8 kg)  12/27/15 160  lb (72.6 kg)  07/19/15 155 lb 3.2 oz (70.4 kg)    Physical Exam  Constitutional: She is oriented to person, place, and time. She appears well-developed and well-nourished.  HENT:  Head: Normocephalic and atraumatic.  Eyes: Pupils are equal, round, and reactive to light. Right eye exhibits no discharge. Left eye exhibits no discharge. No scleral icterus.  Neck: Normal range of motion. Neck supple. Carotid bruit is not present. No thyromegaly present.  Cardiovascular: Normal rate, regular rhythm and normal heart sounds.  Exam reveals no gallop and no friction rub.   No murmur heard. Pulmonary/Chest: Effort normal and breath sounds normal. No respiratory distress. She has no wheezes. She has no rales.  Abdominal: Soft. Bowel sounds are normal. There is no tenderness. There is no rebound.  Genitourinary: No breast swelling, tenderness or discharge.  Musculoskeletal: Normal range of motion.  Lymphadenopathy:    She has no cervical adenopathy.  Neurological: She is alert and oriented to person, place, and time.  Skin: Skin is warm, dry and intact. No rash noted.  Psychiatric: She has a normal mood and affect. Her speech is normal and behavior is normal. Judgment and thought content normal. Cognition and memory are normal.    Results for orders placed or performed in visit on 12/27/15  Comprehensive metabolic panel  Result Value Ref Range   Glucose 87 65 - 99 mg/dL   BUN 16 8 - 27 mg/dL   Creatinine, Ser 0.78 0.57 - 1.00 mg/dL   GFR calc non Af Amer 77 >59 mL/min/1.73   GFR calc Af Amer 89 >59 mL/min/1.73   BUN/Creatinine Ratio 21 12 - 28   Sodium 144 134 - 144 mmol/L   Potassium 4.1 3.5 - 5.2 mmol/L   Chloride 104 96 - 106 mmol/L   CO2 29 18 - 29 mmol/L   Calcium 8.7 8.7 - 10.3 mg/dL   Total Protein 6.1 6.0 - 8.5 g/dL   Albumin 3.8 3.5 - 4.8 g/dL   Globulin, Total 2.3 1.5 - 4.5 g/dL   Albumin/Globulin Ratio 1.7 1.2 - 2.2   Bilirubin Total 0.3 0.0 - 1.2 mg/dL   Alkaline Phosphatase  59 39 - 117 IU/L   AST 19 0 - 40 IU/L   ALT 16 0 - 32 IU/L  Lipid Panel Piccolo, Waived  Result Value Ref Range   Cholesterol Piccolo, Waived 143 <200 mg/dL   HDL Chol Piccolo, Waived 56 (L) >59 mg/dL   Triglycerides Piccolo,Waived 214 (H) <150 mg/dL   Chol/HDL Ratio Piccolo,Waive 2.6  mg/dL   LDL Chol Calc Piccolo Waived 45 <100 mg/dL   VLDL Chol Calc Piccolo,Waive 43 (H) <30 mg/dL      Assessment & Plan:   Problem List Items Addressed This Visit      Unprioritized   Advance care planning    A voluntary discussion about advance care planning including the explanation and discussion of advance directives was extensively discussed  with the patient.  Explanation about the health care proxy and Living will was reviewed.  She has done the forms and does not want to make changes.  She identifies her health care proxy as her husband.        Depression    Stable, continue present medications.        GERD (gastroesophageal reflux disease)    New problem.  Start Omeprazole.        Relevant Medications   omeprazole (PRILOSEC) 20 MG capsule   Hyperlipidemia    Stable, continue present medications.        Hypertension    Stable, continue present medications.        Relevant Orders   Comprehensive metabolic panel   Lipid Panel w/o Chol/HDL Ratio   Neck pain    Persistent problem.  Refer to Physical Therapy      Relevant Orders   Ambulatory referral to Physical Therapy    Other Visit Diagnoses    Annual physical exam    -  Primary   Encounter for screening mammogram for breast cancer           Follow up plan: Return in about 6 months (around 01/23/2017).

## 2016-07-24 NOTE — Patient Instructions (Addendum)
Gastroesophageal Reflux Disease, Adult Normally, food travels down the esophagus and stays in the stomach to be digested. However, when a person has gastroesophageal reflux disease (GERD), food and stomach acid move back up into the esophagus. When this happens, the esophagus becomes sore and inflamed. Over time, GERD can create small holes (ulcers) in the lining of the esophagus. What are the causes? This condition is caused by a problem with the muscle between the esophagus and the stomach (lower esophageal sphincter, or LES). Normally, the LES muscle closes after food passes through the esophagus to the stomach. When the LES is weakened or abnormal, it does not close properly, and that allows food and stomach acid to go back up into the esophagus. The LES can be weakened by certain dietary substances, medicines, and medical conditions, including:  Tobacco use.  Pregnancy.  Having a hiatal hernia.  Heavy alcohol use.  Certain foods and beverages, such as coffee, chocolate, onions, and peppermint. What increases the risk? This condition is more likely to develop in:  People who have an increased body weight.  People who have connective tissue disorders.  People who use NSAID medicines. What are the signs or symptoms? Symptoms of this condition include:  Heartburn.  Difficult or painful swallowing.  The feeling of having a lump in the throat.  Abitter taste in the mouth.  Bad breath.  Having a large amount of saliva.  Having an upset or bloated stomach.  Belching.  Chest pain.  Shortness of breath or wheezing.  Ongoing (chronic) cough or a night-time cough.  Wearing away of tooth enamel.  Weight loss. Different conditions can cause chest pain. Make sure to see your health care provider if you experience chest pain. How is this diagnosed? Your health care provider will take a medical history and perform a physical exam. To determine if you have mild or severe GERD,  your health care provider may also monitor how you respond to treatment. You may also have other tests, including:  An endoscopy toexamine your stomach and esophagus with a small camera.  A test thatmeasures the acidity level in your esophagus.  A test thatmeasures how much pressure is on your esophagus.  A barium swallow or modified barium swallow to show the shape, size, and functioning of your esophagus. How is this treated? The goal of treatment is to help relieve your symptoms and to prevent complications. Treatment for this condition may vary depending on how severe your symptoms are. Your health care provider may recommend:  Changes to your diet.  Medicine.  Surgery. Follow these instructions at home: Diet   Follow a diet as recommended by your health care provider. This may involve avoiding foods and drinks such as:  Coffee and tea (with or without caffeine).  Drinks that containalcohol.  Energy drinks and sports drinks.  Carbonated drinks or sodas.  Chocolate and cocoa.  Peppermint and mint flavorings.  Garlic and onions.  Horseradish.  Spicy and acidic foods, including peppers, chili powder, curry powder, vinegar, hot sauces, and barbecue sauce.  Citrus fruit juices and citrus fruits, such as oranges, lemons, and limes.  Tomato-based foods, such as red sauce, chili, salsa, and pizza with red sauce.  Fried and fatty foods, such as donuts, french fries, potato chips, and high-fat dressings.  High-fat meats, such as hot dogs and fatty cuts of red and white meats, such as rib eye steak, sausage, ham, and bacon.  High-fat dairy items, such as whole milk, butter, and cream cheese.  Eat small, frequent meals instead of large meals.  Avoid drinking large amounts of liquid with your meals.  Avoid eating meals during the 2-3 hours before bedtime.  Avoid lying down right after you eat.  Do not exercise right after you eat. General instructions   Pay  attention to any changes in your symptoms.  Take over-the-counter and prescription medicines only as told by your health care provider. Do not take aspirin, ibuprofen, or other NSAIDs unless your health care provider told you to do so.  Do not use any tobacco products, including cigarettes, chewing tobacco, and e-cigarettes. If you need help quitting, ask your health care provider.  Wear loose-fitting clothing. Do not wear anything tight around your waist that causes pressure on your abdomen.  Raise (elevate) the head of your bed 6 inches (15cm).  Try to reduce your stress, such as with yoga or meditation. If you need help reducing stress, ask your health care provider.  If you are overweight, reduce your weight to an amount that is healthy for you. Ask your health care provider for guidance about a safe weight loss goal.  Keep all follow-up visits as told by your health care provider. This is important. Contact a health care provider if:  You have new symptoms.  You have unexplained weight loss.  You have difficulty swallowing, or it hurts to swallow.  You have wheezing or a persistent cough.  Your symptoms do not improve with treatment.  You have a hoarse voice. Get help right away if:  You have pain in your arms, neck, jaw, teeth, or back.  You feel sweaty, dizzy, or light-headed.  You have chest pain or shortness of breath.  You vomit and your vomit looks like blood or coffee grounds.  You faint.  Your stool is bloody or black.  You cannot swallow, drink, or eat. This information is not intended to replace advice given to you by your health care provider. Make sure you discuss any questions you have with your health care provider. Document Released: 12/24/2004 Document Revised: 08/14/2015 Document Reviewed: 07/11/2014 Elsevier Interactive Patient Education  2017 [employer] Maintenance, Female Adopting a healthy lifestyle and getting preventive care can go  a long way to promote health and wellness. Talk with your health care provider about what schedule of regular examinations is right for you. This is a good chance for you to check in with your provider about disease prevention and staying healthy. In between checkups, there are plenty of things you can do on your own. Experts have done a lot of research about which lifestyle changes and preventive measures are most likely to keep you healthy. Ask your health care provider for more information. Weight and diet Eat a healthy diet  Be sure to include plenty of vegetables, fruits, low-fat dairy products, and lean protein.  Do not eat a lot of foods high in solid fats, added sugars, or salt.  Get regular exercise. This is one of the most important things you can do for your health.  Most adults should exercise for at least 150 minutes each week. The exercise should increase your heart rate and make you sweat (moderate-intensity exercise).  Most adults should also do strengthening exercises at least twice a week. This is in addition to the moderate-intensity exercise. Maintain a healthy weight  Body mass index (BMI) is a measurement that can be used to identify possible weight problems. It estimates body fat based on height and weight. Your health care  provider can help determine your BMI and help you achieve or maintain a healthy weight.  For females 28 years of age and older:  A BMI below 18.5 is considered underweight.  A BMI of 18.5 to 24.9 is normal.  A BMI of 25 to 29.9 is considered overweight.  A BMI of 30 and above is considered obese. Watch levels of cholesterol and blood lipids  You should start having your blood tested for lipids and cholesterol at 71 years of age, then have this test every 5 years.  You may need to have your cholesterol levels checked more often if:  Your lipid or cholesterol levels are high.  You are older than 71 years of age.  You are at high risk for  heart disease. Cancer screening Lung Cancer  Lung cancer screening is recommended for adults 28-56 years old who are at high risk for lung cancer because of a history of smoking.  A yearly low-dose CT scan of the lungs is recommended for people who:  Currently smoke.  Have quit within the past 15 years.  Have at least a 30-pack-year history of smoking. A pack year is smoking an average of one pack of cigarettes a day for 1 year.  Yearly screening should continue until it has been 15 years since you quit.  Yearly screening should stop if you develop a health problem that would prevent you from having lung cancer treatment. Breast Cancer  Practice breast self-awareness. This means understanding how your breasts normally appear and feel.  It also means doing regular breast self-exams. Let your health care provider know about any changes, no matter how small.  If you are in your 20s or 30s, you should have a clinical breast exam (CBE) by a health care provider every 1-3 years as part of a regular health exam.  If you are 74 or older, have a CBE every year. Also consider having a breast X-ray (mammogram) every year.  If you have a family history of breast cancer, talk to your health care provider about genetic screening.  If you are at high risk for breast cancer, talk to your health care provider about having an MRI and a mammogram every year.  Breast cancer gene (BRCA) assessment is recommended for women who have family members with BRCA-related cancers. BRCA-related cancers include:  Breast.  Ovarian.  Tubal.  Peritoneal cancers.  Results of the assessment will determine the need for genetic counseling and BRCA1 and BRCA2 testing. Cervical Cancer  Your health care provider may recommend that you be screened regularly for cancer of the pelvic organs (ovaries, uterus, and vagina). This screening involves a pelvic examination, including checking for microscopic changes to the  surface of your cervix (Pap test). You may be encouraged to have this screening done every 3 years, beginning at age 69.  For women ages 68-65, health care providers may recommend pelvic exams and Pap testing every 3 years, or they may recommend the Pap and pelvic exam, combined with testing for human papilloma virus (HPV), every 5 years. Some types of HPV increase your risk of cervical cancer. Testing for HPV may also be done on women of any age with unclear Pap test results.  Other health care providers may not recommend any screening for nonpregnant women who are considered low risk for pelvic cancer and who do not have symptoms. Ask your health care provider if a screening pelvic exam is right for you.  If you have had past treatment for  cervical cancer or a condition that could lead to cancer, you need Pap tests and screening for cancer for at least 20 years after your treatment. If Pap tests have been discontinued, your risk factors (such as having a new sexual partner) need to be reassessed to determine if screening should resume. Some women have medical problems that increase the chance of getting cervical cancer. In these cases, your health care provider may recommend more frequent screening and Pap tests. Colorectal Cancer  This type of cancer can be detected and often prevented.  Routine colorectal cancer screening usually begins at 71 years of age and continues through 71 years of age.  Your health care provider may recommend screening at an earlier age if you have risk factors for colon cancer.  Your health care provider may also recommend using home test kits to check for hidden blood in the stool.  A small camera at the end of a tube can be used to examine your colon directly (sigmoidoscopy or colonoscopy). This is done to check for the earliest forms of colorectal cancer.  Routine screening usually begins at age 70.  Direct examination of the colon should be repeated every 5-10  years through 71 years of age. However, you may need to be screened more often if early forms of precancerous polyps or small growths are found. Skin Cancer  Check your skin from head to toe regularly.  Tell your health care provider about any new moles or changes in moles, especially if there is a change in a mole's shape or color.  Also tell your health care provider if you have a mole that is larger than the size of a pencil eraser.  Always use sunscreen. Apply sunscreen liberally and repeatedly throughout the day.  Protect yourself by wearing long sleeves, pants, a wide-brimmed hat, and sunglasses whenever you are outside. Heart disease, diabetes, and high blood pressure  High blood pressure causes heart disease and increases the risk of stroke. High blood pressure is more likely to develop in:  People who have blood pressure in the high end of the normal range (130-139/85-89 mm Hg).  People who are overweight or obese.  People who are African American.  If you are 50-43 years of age, have your blood pressure checked every 3-5 years. If you are 34 years of age or older, have your blood pressure checked every year. You should have your blood pressure measured twice-once when you are at a hospital or clinic, and once when you are not at a hospital or clinic. Record the average of the two measurements. To check your blood pressure when you are not at a hospital or clinic, you can use:  An automated blood pressure machine at a pharmacy.  A home blood pressure monitor.  If you are between 28 years and 63 years old, ask your health care provider if you should take aspirin to prevent strokes.  Have regular diabetes screenings. This involves taking a blood sample to check your fasting blood sugar level.  If you are at a normal weight and have a low risk for diabetes, have this test once every three years after 71 years of age.  If you are overweight and have a high risk for diabetes,  consider being tested at a younger age or more often. Preventing infection Hepatitis B  If you have a higher risk for hepatitis B, you should be screened for this virus. You are considered at high risk for hepatitis B if:  You were born in a country where hepatitis B is common. Ask your health care provider which countries are considered high risk.  Your parents were born in a high-risk country, and you have not been immunized against hepatitis B (hepatitis B vaccine).  You have HIV or AIDS.  You use needles to inject street drugs.  You live with someone who has hepatitis B.  You have had sex with someone who has hepatitis B.  You get hemodialysis treatment.  You take certain medicines for conditions, including cancer, organ transplantation, and autoimmune conditions. Hepatitis C  Blood testing is recommended for:  Everyone born from [birth year] through 1965.  Anyone with known risk factors for hepatitis C. Sexually transmitted infections (STIs)  You should be screened for sexually transmitted infections (STIs) including gonorrhea and chlamydia if:  You are sexually active and are younger than 71 years of age.  You are older than 71 years of age and your health care provider tells you that you are at risk for this type of infection.  Your sexual activity has changed since you were last screened and you are at an increased risk for chlamydia or gonorrhea. Ask your health care provider if you are at risk.  If you do not have HIV, but are at risk, it may be recommended that you take a prescription medicine daily to prevent HIV infection. This is called pre-exposure prophylaxis (PrEP). You are considered at risk if:  You are sexually active and do not regularly use condoms or know the HIV status of your partner(s).  You take drugs by injection.  You are sexually active with a partner who has HIV. Talk with your health care provider about whether you are at high risk of being  infected with HIV. If you choose to begin PrEP, you should first be tested for HIV. You should then be tested every 3 months for as long as you are taking PrEP. Pregnancy  If you are premenopausal and you may become pregnant, ask your health care provider about preconception counseling.  If you may become pregnant, take 400 to 800 micrograms (mcg) of folic acid every day.  If you want to prevent pregnancy, talk to your health care provider about birth control (contraception). Osteoporosis and menopause  Osteoporosis is a disease in which the bones lose minerals and strength with aging. This can result in serious bone fractures. Your risk for osteoporosis can be identified using a bone density scan.  If you are 63 years of age or older, or if you are at risk for osteoporosis and fractures, ask your health care provider if you should be screened.  Ask your health care provider whether you should take a calcium or vitamin D supplement to lower your risk for osteoporosis.  Menopause may have certain physical symptoms and risks.  Hormone replacement therapy may reduce some of these symptoms and risks. Talk to your health care provider about whether hormone replacement therapy is right for you. Follow these instructions at home:  Schedule regular health, dental, and eye exams.  Stay current with your immunizations.  Do not use any tobacco products including cigarettes, chewing tobacco, or electronic cigarettes.  If you are pregnant, do not drink alcohol.  If you are breastfeeding, limit how much and how often you drink alcohol.  Limit alcohol intake to no more than 1 drink per day for nonpregnant women. One drink equals 12 ounces of beer, 5 ounces of wine, or 1 ounces of hard liquor.  Do  not use street drugs.  Do not share needles.  Ask your health care provider for help if you need support or information about quitting drugs.  Tell your health care provider if you often feel  depressed.  Tell your health care provider if you have ever been abused or do not feel safe at home. This information is not intended to replace advice given to you by your health care provider. Make sure you discuss any questions you have with your health care provider. Document Released: 09/29/2010 Document Revised: 08/22/2015 Document Reviewed: 12/18/2014 Elsevier Interactive Patient Education  2017 [employer]. +-++

## 2016-07-24 NOTE — Assessment & Plan Note (Signed)
Persistent problem.  Refer to Physical Therapy

## 2016-07-25 LAB — COMPREHENSIVE METABOLIC PANEL
ALBUMIN: 4.3 g/dL (ref 3.5–4.8)
ALK PHOS: 66 IU/L (ref 39–117)
ALT: 17 IU/L (ref 0–32)
AST: 22 IU/L (ref 0–40)
Albumin/Globulin Ratio: 1.7 (ref 1.2–2.2)
BUN / CREAT RATIO: 26 (ref 12–28)
BUN: 21 mg/dL (ref 8–27)
Bilirubin Total: 0.4 mg/dL (ref 0.0–1.2)
CALCIUM: 9.6 mg/dL (ref 8.7–10.3)
CO2: 26 mmol/L (ref 18–29)
CREATININE: 0.81 mg/dL (ref 0.57–1.00)
Chloride: 101 mmol/L (ref 96–106)
GFR calc Af Amer: 85 mL/min/{1.73_m2} (ref >59)
GFR calc non Af Amer: 74 mL/min/{1.73_m2} (ref >59)
GLUCOSE: 93 mg/dL (ref 65–99)
Globulin, Total: 2.6 g/dL (ref 1.5–4.5)
Potassium: 4.2 mmol/L (ref 3.5–5.2)
Sodium: 143 mmol/L (ref 134–144)
TOTAL PROTEIN: 6.9 g/dL (ref 6.0–8.5)

## 2016-07-25 LAB — LIPID PANEL W/O CHOL/HDL RATIO
Cholesterol, Total: 194 mg/dL (ref 100–199)
HDL: 70 mg/dL
LDL Calculated: 104 mg/dL — ABNORMAL HIGH (ref 0–99)
Triglycerides: 99 mg/dL (ref 0–149)
VLDL Cholesterol Cal: 20 mg/dL (ref 5–40)

## 2016-07-27 ENCOUNTER — Encounter: Payer: Self-pay | Admitting: Unknown Physician Specialty

## 2016-07-29 ENCOUNTER — Telehealth: Payer: Self-pay | Admitting: Unknown Physician Specialty

## 2016-07-29 NOTE — Telephone Encounter (Signed)
Patient returned my call. I let her know that we have sent out a letter with results and that they were normal according to the letter. I asked for patient to give me a call back if she does not receive her letter by Friday.

## 2016-07-29 NOTE — Telephone Encounter (Signed)
Patient called to see if her blood work results were in and if she could have her results mailed to her as well. Please advise.   Patient's contact number is: [phone]  Thank you

## 2016-07-29 NOTE — Telephone Encounter (Signed)
Called and left patient a VM asking for her to please return my call.  

## 2016-09-18 ENCOUNTER — Ambulatory Visit (INDEPENDENT_AMBULATORY_CARE_PROVIDER_SITE_OTHER): Payer: Medicare Other | Admitting: Unknown Physician Specialty

## 2016-09-18 ENCOUNTER — Encounter: Payer: Self-pay | Admitting: Unknown Physician Specialty

## 2016-09-18 VITALS — BP 136/73 | HR 66 | Temp 98.0°F | Wt 158.4 lb

## 2016-09-18 DIAGNOSIS — F325 Major depressive disorder, single episode, in full remission: Secondary | ICD-10-CM

## 2016-09-18 DIAGNOSIS — E162 Hypoglycemia, unspecified: Secondary | ICD-10-CM

## 2016-09-18 NOTE — Assessment & Plan Note (Signed)
Increase Fluoxetine to 30 mg as she has been taking 20 mg.

## 2016-09-18 NOTE — Progress Notes (Signed)
BP 136/73   Pulse 66   Temp 98 F (36.7 C)   Wt 158 lb 6.4 oz (71.8 kg)   LMP 06/30/1991 (Approximate)   SpO2 97%   BMI 25.41 kg/m    Subjective:    Patient ID: Connie Morrison, female    DOB: July 23, 1945, 71 y.o.   MRN: [id number]  HPI: Connie Morrison is a 71 y.o. female  Chief Complaint  Patient presents with  . Blood Sugar Problem    pt states that she thinks her blood sugar has been low for the past week, states she has been feeling shaky and trembly   Pt states she feels like her sugar is dropping this week after water aerobics.  She admits it is hot and humid.  States in general she is weepy and "can't stand myself" this week.  States she eats a "normal" breakfast before swimming of fruit, oatmeal, and milk.  She has been doing water aerobics for about a year.      Depression screen [facility] 2/9 07/24/2016 07/19/2015 03/01/2015  Decreased Interest 0 0 0  Down, Depressed, Hopeless 0 0 0  PHQ - 2 Score 0 0 0  Altered sleeping 1 - -  Tired, decreased energy 1 - -  Change in appetite 1 - -  Feeling bad or failure about yourself  0 - -  Trouble concentrating 0 - -  Moving slowly or fidgety/restless 0 - -  Suicidal thoughts 0 - -  PHQ-9 Score 3 - -     Relevant past medical, surgical, family and social history reviewed and updated as indicated. Interim medical history since our last visit reviewed. Allergies and medications reviewed and updated.  Review of Systems  Per HPI unless specifically indicated above     Objective:    BP 136/73   Pulse 66   Temp 98 F (36.7 C)   Wt 158 lb 6.4 oz (71.8 kg)   LMP 06/30/1991 (Approximate)   SpO2 97%   BMI 25.41 kg/m   Wt Readings from Last 3 Encounters:  09/18/16 158 lb 6.4 oz (71.8 kg)  07/24/16 158 lb 3.2 oz (71.8 kg)  12/27/15 160 lb (72.6 kg)    Physical Exam  Constitutional: She is oriented to person, place, and time. She appears well-developed and well-nourished. No distress.  HENT:  Head: Normocephalic and  atraumatic.  Eyes: Conjunctivae and lids are normal. Right eye exhibits no discharge. Left eye exhibits no discharge. No scleral icterus.  Neck: Normal range of motion. Neck supple. No JVD present. Carotid bruit is not present.  Cardiovascular: Normal rate, regular rhythm and normal heart sounds.   Pulmonary/Chest: Effort normal and breath sounds normal.  Abdominal: Normal appearance. There is no splenomegaly or hepatomegaly.  Musculoskeletal: Normal range of motion.  Neurological: She is alert and oriented to person, place, and time.  Skin: Skin is warm, dry and intact. No rash noted. No pallor.  Psychiatric: She has a normal mood and affect. Her behavior is normal. Judgment and thought content normal.    Results for orders placed or performed in visit on 07/24/16  Comprehensive metabolic panel  Result Value Ref Range   Glucose 93 65 - 99 mg/dL   BUN 21 8 - 27 mg/dL   Creatinine, Ser 0.81 0.57 - 1.00 mg/dL   GFR calc non Af Amer 74 >59 mL/min/1.73   GFR calc Af Amer 85 >59 mL/min/1.73   BUN/Creatinine Ratio 26 12 - 28   Sodium 143 134 -  144 mmol/L   Potassium 4.2 3.5 - 5.2 mmol/L   Chloride 101 96 - 106 mmol/L   CO2 26 18 - 29 mmol/L   Calcium 9.6 8.7 - 10.3 mg/dL   Total Protein 6.9 6.0 - 8.5 g/dL   Albumin 4.3 3.5 - 4.8 g/dL   Globulin, Total 2.6 1.5 - 4.5 g/dL   Albumin/Globulin Ratio 1.7 1.2 - 2.2   Bilirubin Total 0.4 0.0 - 1.2 mg/dL   Alkaline Phosphatase 66 39 - 117 IU/L   AST 22 0 - 40 IU/L   ALT 17 0 - 32 IU/L  Lipid Panel w/o Chol/HDL Ratio  Result Value Ref Range   Cholesterol, Total 194 100 - 199 mg/dL   Triglycerides 99 0 - 149 mg/dL   HDL 70 >39 mg/dL   VLDL Cholesterol Cal 20 5 - 40 mg/dL   LDL Calculated 104 (H) 0 - 99 mg/dL      Assessment & Plan:   Problem List Items Addressed This Visit      Unprioritized   Depression    Increase Fluoxetine to 30 mg as she has been taking 20 mg.         Other Visit Diagnoses    Hypoglycemia    -  Primary   Pt  with episodic hypoglycemia which I think is related to the heat.  Discussed diet.         Follow up plan: Return if symptoms worsen or fail to improve.

## 2016-09-23 ENCOUNTER — Other Ambulatory Visit: Payer: Self-pay

## 2016-09-23 MED ORDER — SIMVASTATIN 20 MG PO TABS: 20.0000 mg | ORAL_TABLET | Freq: Every day | ORAL | 3 refills | Status: DC

## 2016-10-28 ENCOUNTER — Encounter: Payer: Self-pay | Admitting: Unknown Physician Specialty

## 2017-01-06 ENCOUNTER — Telehealth: Payer: Self-pay | Admitting: Unknown Physician Specialty

## 2017-01-06 NOTE — Telephone Encounter (Signed)
Patient has been having weakness with shaking and is getting concerned. She would like to know if Connie Morrison needs her to come in for some labs to check to see if she is having issues with a drop in her sugar.  Please advise.  Thank you

## 2017-01-06 NOTE — Telephone Encounter (Signed)
It sounds like what is going on is worse, so needs to be seen

## 2017-01-06 NOTE — Telephone Encounter (Signed)
Routing to provider. Patient needs to come in for an office visit correct?

## 2017-01-06 NOTE — Telephone Encounter (Signed)
Santiago Glad, please schedule patient an appointment. Thanks.

## 2017-01-07 NOTE — Telephone Encounter (Signed)
Called patient left message on patient VM to call back to schedule an appointment

## 2017-01-07 NOTE — Telephone Encounter (Signed)
Patient called back scheduled appt for Monday 10/15 @ 8:30 with Malachy Mood

## 2017-01-11 ENCOUNTER — Ambulatory Visit (INDEPENDENT_AMBULATORY_CARE_PROVIDER_SITE_OTHER): Payer: Medicare Other | Admitting: Unknown Physician Specialty

## 2017-01-11 ENCOUNTER — Other Ambulatory Visit
Admission: RE | Admit: 2017-01-11 | Discharge: 2017-01-11 | Disposition: A | Discharge status: Home / Self Care | Payer: Medicare Other | Source: Ambulatory Visit | Attending: Unknown Physician Specialty | Admitting: Unknown Physician Specialty

## 2017-01-11 ENCOUNTER — Encounter: Payer: Self-pay | Admitting: Unknown Physician Specialty

## 2017-01-11 VITALS — BP 119/75 | HR 64 | Temp 98.7°F | Wt 160.4 lb

## 2017-01-11 DIAGNOSIS — E162 Hypoglycemia, unspecified: Secondary | ICD-10-CM | POA: Diagnosis not present

## 2017-01-11 DIAGNOSIS — E7800 Pure hypercholesterolemia, unspecified: Secondary | ICD-10-CM | POA: Diagnosis not present

## 2017-01-11 DIAGNOSIS — R5383 Other fatigue: Secondary | ICD-10-CM | POA: Insufficient documentation

## 2017-01-11 DIAGNOSIS — E785 Hyperlipidemia, unspecified: Secondary | ICD-10-CM | POA: Insufficient documentation

## 2017-01-11 LAB — COMPREHENSIVE METABOLIC PANEL
ALT: 16 U/L (ref 14–54)
ANION GAP: 9 (ref 5–15)
AST: 24 U/L (ref 15–41)
Albumin: 4.1 g/dL (ref 3.5–5.0)
Alkaline Phosphatase: 61 U/L (ref 38–126)
BUN: 22 mg/dL — ABNORMAL HIGH (ref 6–20)
CHLORIDE: 103 mmol/L (ref 101–111)
CO2: 30 mmol/L (ref 22–32)
CREATININE: 0.83 mg/dL (ref 0.44–1.00)
Calcium: 9.5 mg/dL (ref 8.9–10.3)
GFR calc non Af Amer: >60 mL/min (ref >60)
Glucose, Bld: 112 mg/dL — ABNORMAL HIGH (ref 65–99)
POTASSIUM: 4.4 mmol/L (ref 3.5–5.1)
SODIUM: 142 mmol/L (ref 135–145)
Total Bilirubin: 0.9 mg/dL (ref 0.3–1.2)
Total Protein: 7.4 g/dL (ref 6.5–8.1)

## 2017-01-11 LAB — HEMOGLOBIN A1C
HEMOGLOBIN A1C: 5.6 % (ref 4.8–5.6)
MEAN PLASMA GLUCOSE: 114.02 mg/dL

## 2017-01-11 LAB — CBC
HCT: 40.3 % (ref 35.0–47.0)
Hemoglobin: 13.6 g/dL (ref 12.0–16.0)
MCH: 30.2 pg (ref 26.0–34.0)
MCHC: 33.8 g/dL (ref 32.0–36.0)
MCV: 89.4 fL (ref 80.0–100.0)
PLATELETS: 195 10*3/uL (ref 150–440)
RBC: 4.5 MIL/uL (ref 3.80–5.20)
RDW: 13.5 % (ref 11.5–14.5)
WBC: 4.1 10*3/uL (ref 3.6–11.0)

## 2017-01-11 LAB — TSH: TSH: 2.086 u[IU]/mL (ref 0.350–4.500)

## 2017-01-11 NOTE — Assessment & Plan Note (Signed)
Pt with muscle and joint pain.  Trial of 2-4 weeks off of statin

## 2017-01-11 NOTE — Progress Notes (Signed)
BP 119/75   Pulse 64   Temp 98.7 F (37.1 C)   Wt 160 lb 6.4 oz (72.8 kg)   LMP 06/30/1991 (Approximate)   SpO2 99%   BMI 25.73 kg/m    Subjective:    Patient ID: Connie Morrison, female    DOB: May 14, 1945, 71 y.o.   MRN: [id number]  HPI: Connie Morrison is a 71 y.o. female  Chief Complaint  Patient presents with  . Blood Sugar Problem    pt states she has been having BS problems, states she was shakey twice last Tuesday   Pt is having episodes of feeling "trembly" and "shakey."  This is happening around 10A.  Eats breakfast at 7A.  Drinks coffee, oatmeal, and fruit plus a small cup of yogurt.  States she eats graham crackers and feels better.  Complains of muscle pain and fatigue  Relevant past medical, surgical, family and social history reviewed and updated as indicated. Interim medical history since our last visit reviewed. Allergies and medications reviewed and updated.  Review of Systems  Per HPI unless specifically indicated above     Objective:    BP 119/75   Pulse 64   Temp 98.7 F (37.1 C)   Wt 160 lb 6.4 oz (72.8 kg)   LMP 06/30/1991 (Approximate)   SpO2 99%   BMI 25.73 kg/m   Wt Readings from Last 3 Encounters:  01/11/17 160 lb 6.4 oz (72.8 kg)  09/18/16 158 lb 6.4 oz (71.8 kg)  07/24/16 158 lb 3.2 oz (71.8 kg)    Physical Exam  Constitutional: She is oriented to person, place, and time. She appears well-developed and well-nourished. No distress.  HENT:  Head: Normocephalic and atraumatic.  Eyes: Conjunctivae and lids are normal. Right eye exhibits no discharge. Left eye exhibits no discharge. No scleral icterus.  Cardiovascular: Normal rate.   Pulmonary/Chest: Effort normal.  Abdominal: Normal appearance. There is no splenomegaly or hepatomegaly.  Musculoskeletal: Normal range of motion.  Neurological: She is alert and oriented to person, place, and time.  Skin: Skin is intact. No rash noted. No pallor.  Psychiatric: She has a normal mood and  affect. Her behavior is normal. Judgment and thought content normal.    Results for orders placed or performed in visit on 07/24/16  Comprehensive metabolic panel  Result Value Ref Range   Glucose 93 65 - 99 mg/dL   BUN 21 8 - 27 mg/dL   Creatinine, Ser 0.81 0.57 - 1.00 mg/dL   GFR calc non Af Amer 74 >59 mL/min/1.73   GFR calc Af Amer 85 >59 mL/min/1.73   BUN/Creatinine Ratio 26 12 - 28   Sodium 143 134 - 144 mmol/L   Potassium 4.2 3.5 - 5.2 mmol/L   Chloride 101 96 - 106 mmol/L   CO2 26 18 - 29 mmol/L   Calcium 9.6 8.7 - 10.3 mg/dL   Total Protein 6.9 6.0 - 8.5 g/dL   Albumin 4.3 3.5 - 4.8 g/dL   Globulin, Total 2.6 1.5 - 4.5 g/dL   Albumin/Globulin Ratio 1.7 1.2 - 2.2   Bilirubin Total 0.4 0.0 - 1.2 mg/dL   Alkaline Phosphatase 66 39 - 117 IU/L   AST 22 0 - 40 IU/L   ALT 17 0 - 32 IU/L  Lipid Panel w/o Chol/HDL Ratio  Result Value Ref Range   Cholesterol, Total 194 100 - 199 mg/dL   Triglycerides 99 0 - 149 mg/dL   HDL 70 >39 mg/dL   VLDL  Cholesterol Cal 20 5 - 40 mg/dL   LDL Calculated 104 (H) 0 - 99 mg/dL      Assessment & Plan:   Problem List Items Addressed This Visit      Unprioritized   Hyperlipidemia    Pt with muscle and joint pain.  Trial of 2-4 weeks off of statin       Other Visit Diagnoses    Hypoglycemia    -  Primary   Check CMP, and Hgb A1C.  Discussed change in diet habits for protein before first exercise class   Relevant Orders   Comprehensive metabolic panel   Hemoglobin A1C   Fatigue, unspecified type       Relevant Orders   CBC   TSH       Follow up plan: Return for routine visit.

## 2017-01-12 ENCOUNTER — Encounter: Payer: Self-pay | Admitting: Family Medicine

## 2017-01-25 ENCOUNTER — Ambulatory Visit: Payer: Medicare Other | Admitting: Unknown Physician Specialty

## 2017-02-03 ENCOUNTER — Ambulatory Visit: Payer: Medicare Other | Admitting: Unknown Physician Specialty

## 2017-05-16 ENCOUNTER — Other Ambulatory Visit: Payer: Self-pay | Admitting: Unknown Physician Specialty

## 2017-05-17 NOTE — Telephone Encounter (Signed)
Fluoxetine refill Last OV: 09/18/16 Last Refill:03/31/16 #90 12 RF Pharmacy:[facility]

## 2017-06-09 ENCOUNTER — Telehealth: Payer: Self-pay | Admitting: Unknown Physician Specialty

## 2017-06-09 DIAGNOSIS — E7800 Pure hypercholesterolemia, unspecified: Secondary | ICD-10-CM

## 2017-06-09 DIAGNOSIS — I10 Essential (primary) hypertension: Secondary | ICD-10-CM

## 2017-06-09 DIAGNOSIS — D709 Neutropenia, unspecified: Secondary | ICD-10-CM

## 2017-06-09 NOTE — Telephone Encounter (Signed)
Hi [place], I Scheduled pt for AWV she had a question about whether she should come in early (prior week) to do her labs or wait until after she is seen for CPE and then come back in to do them. Could you please call her with what Malachy Mood would prefer? Thank you! Curt Bears

## 2017-06-14 NOTE — Telephone Encounter (Signed)
Called and let patient know that orders have been entered and that she can stop by one morning to have them done.

## 2017-06-14 NOTE — Telephone Encounter (Signed)
Routing to provider. Can patient have labs done at [facility] or does she need to wait until CPE? If she can have them done early, please enter orders.

## 2017-06-14 NOTE — Telephone Encounter (Signed)
I will put in an order in for a CMP. Lipid and CBC

## 2017-07-26 ENCOUNTER — Encounter: Payer: Medicare Other | Admitting: Unknown Physician Specialty

## 2017-07-26 ENCOUNTER — Ambulatory Visit: Payer: Medicare Other

## 2017-08-08 ENCOUNTER — Other Ambulatory Visit: Payer: Self-pay | Admitting: Unknown Physician Specialty

## 2017-08-09 ENCOUNTER — Other Ambulatory Visit: Payer: Medicare Other

## 2017-08-09 DIAGNOSIS — E7800 Pure hypercholesterolemia, unspecified: Secondary | ICD-10-CM

## 2017-08-09 DIAGNOSIS — I10 Essential (primary) hypertension: Secondary | ICD-10-CM

## 2017-08-09 DIAGNOSIS — D709 Neutropenia, unspecified: Secondary | ICD-10-CM

## 2017-08-10 LAB — COMPREHENSIVE METABOLIC PANEL
ALBUMIN: 4.1 g/dL (ref 3.5–4.8)
ALK PHOS: 68 IU/L (ref 39–117)
ALT: 16 IU/L (ref 0–32)
AST: 20 IU/L (ref 0–40)
Albumin/Globulin Ratio: 1.6 (ref 1.2–2.2)
BILIRUBIN TOTAL: 0.5 mg/dL (ref 0.0–1.2)
BUN / CREAT RATIO: 20 (ref 12–28)
BUN: 17 mg/dL (ref 8–27)
CHLORIDE: 103 mmol/L (ref 96–106)
CO2: 28 mmol/L (ref 20–29)
Calcium: 9.3 mg/dL (ref 8.7–10.3)
Creatinine, Ser: 0.83 mg/dL (ref 0.57–1.00)
GFR calc Af Amer: 82 mL/min/{1.73_m2} (ref >59)
GFR, EST NON AFRICAN AMERICAN: 71 mL/min/{1.73_m2} (ref >59)
GLUCOSE: 95 mg/dL (ref 65–99)
Globulin, Total: 2.6 g/dL (ref 1.5–4.5)
Potassium: 3.9 mmol/L (ref 3.5–5.2)
Sodium: 144 mmol/L (ref 134–144)
Total Protein: 6.7 g/dL (ref 6.0–8.5)

## 2017-08-10 LAB — CBC WITH DIFFERENTIAL/PLATELET
BASOS: 0 %
Basophils Absolute: 0 10*3/uL (ref 0.0–0.2)
EOS (ABSOLUTE): 0.1 10*3/uL (ref 0.0–0.4)
EOS: 3 %
HEMATOCRIT: 40.1 % (ref 34.0–46.6)
HEMOGLOBIN: 12.8 g/dL (ref 11.1–15.9)
IMMATURE GRANS (ABS): 0 10*3/uL (ref 0.0–0.1)
IMMATURE GRANULOCYTES: 0 %
Lymphocytes Absolute: 1.2 10*3/uL (ref 0.7–3.1)
Lymphs: 34 %
MCH: 29 pg (ref 26.6–33.0)
MCHC: 31.9 g/dL (ref 31.5–35.7)
MCV: 91 fL (ref 79–97)
MONOCYTES: 13 %
Monocytes Absolute: 0.4 10*3/uL (ref 0.1–0.9)
NEUTROS PCT: 50 %
Neutrophils Absolute: 1.7 10*3/uL (ref 1.4–7.0)
Platelets: 224 10*3/uL (ref 150–379)
RBC: 4.41 x10E6/uL (ref 3.77–5.28)
RDW: 13.7 % (ref 12.3–15.4)
WBC: 3.4 10*3/uL (ref 3.4–10.8)

## 2017-08-10 LAB — LIPID PANEL W/O CHOL/HDL RATIO
Cholesterol, Total: 210 mg/dL — ABNORMAL HIGH (ref 100–199)
HDL: 63 mg/dL (ref >39)
LDL CALC: 121 mg/dL — AB (ref 0–99)
TRIGLYCERIDES: 129 mg/dL (ref 0–149)
VLDL Cholesterol Cal: 26 mg/dL (ref 5–40)

## 2017-08-17 ENCOUNTER — Ambulatory Visit: Payer: Medicare Other | Admitting: Unknown Physician Specialty

## 2017-08-17 ENCOUNTER — Encounter: Payer: Self-pay | Admitting: Unknown Physician Specialty

## 2017-08-17 VITALS — BP 121/77 | HR 59 | Temp 97.6°F | Ht 65.8 in | Wt 161.4 lb

## 2017-08-17 DIAGNOSIS — K219 Gastro-esophageal reflux disease without esophagitis: Secondary | ICD-10-CM | POA: Diagnosis not present

## 2017-08-17 DIAGNOSIS — F325 Major depressive disorder, single episode, in full remission: Secondary | ICD-10-CM

## 2017-08-17 DIAGNOSIS — E7800 Pure hypercholesterolemia, unspecified: Secondary | ICD-10-CM | POA: Diagnosis not present

## 2017-08-17 DIAGNOSIS — Z1211 Encounter for screening for malignant neoplasm of colon: Secondary | ICD-10-CM

## 2017-08-17 DIAGNOSIS — Z0000 Encounter for general adult medical examination without abnormal findings: Secondary | ICD-10-CM

## 2017-08-17 DIAGNOSIS — Z7189 Other specified counseling: Secondary | ICD-10-CM

## 2017-08-17 DIAGNOSIS — M8580 Other specified disorders of bone density and structure, unspecified site: Secondary | ICD-10-CM | POA: Diagnosis not present

## 2017-08-17 MED ORDER — FLUOXETINE HCL 10 MG PO CAPS: 30.0000 mg | ORAL_CAPSULE | Freq: Every day | ORAL | 2 refills | Status: DC

## 2017-08-17 MED ORDER — HYDROCHLOROTHIAZIDE 25 MG PO TABS: 25.0000 mg | ORAL_TABLET | Freq: Every day | ORAL | 2 refills | Status: DC | PRN

## 2017-08-17 MED ORDER — SIMVASTATIN 20 MG PO TABS: 20.0000 mg | ORAL_TABLET | Freq: Every day | ORAL | 3 refills | Status: DC

## 2017-08-17 MED ORDER — OMEPRAZOLE 20 MG PO CPDR: 20.0000 mg | DELAYED_RELEASE_CAPSULE | Freq: Every day | ORAL | 1 refills | Status: DC

## 2017-08-17 NOTE — Progress Notes (Signed)
BP 121/77   Pulse (!) 59   Temp 97.6 F (36.4 C) (Oral)   Ht 5' 5.8" (1.671 m)   Wt 161 lb 6.4 oz (73.2 kg)   LMP 06/30/1991 (Approximate)   SpO2 97%   BMI 26.21 kg/m    Subjective:    Patient ID: Connie Morrison, female    DOB: 05/14/1945, 72 y.o.   MRN: [id number]  HPI: Connie Morrison is a 72 y.o. female  Chief Complaint  Patient presents with  . Medicare Wellness    no wellness visit done    Functional Status Survey: Is the patient deaf or have difficulty hearing?: No Does the patient have difficulty seeing, even when wearing glasses/contacts?: No Does the patient have difficulty concentrating, remembering, or making decisions?: No Does the patient have difficulty walking or climbing stairs?: No Does the patient have difficulty dressing or bathing?: No Does the patient have difficulty doing errands alone such as visiting a doctor's office or shopping?: No  Fall Risk  08/17/2017 01/11/2017 07/24/2016 07/19/2015  Falls in the past year? No No No No    Depression Takes fluoxetine 20-30 mgs/day.  Doing well Depression screen [facility] 2/9 08/17/2017 01/11/2017 07/24/2016 07/19/2015 03/01/2015  Decreased Interest 0 0 0 0 0  Down, Depressed, Hopeless 0 0 0 0 0  PHQ - 2 Score 0 0 0 0 0  Altered sleeping 1 0 1 - -  Tired, decreased energy 0 0 1 - -  Change in appetite 1 0 1 - -  Feeling bad or failure about yourself  0 0 0 - -  Trouble concentrating 1 0 0 - -  Moving slowly or fidgety/restless 0 0 0 - -  Suicidal thoughts 0 0 0 - -  PHQ-9 Score 3 0 3 - -    Hyperlipidemia Using medications without problems: No Muscle aches  Diet compliance: Exercise:  Takes occasional HCTZ and would like a refill.  No hypertension  GERD Takes Omeprazole daily.  Still indigestion at times when eating certain foods.  Wonders if she can take and extra.    Social History   Socioeconomic History  . Marital status: Married    Spouse name: Not on file  . Number of children: Not on file  .  Years of education: Not on file  . Highest education level: Not on file  Occupational History  . Not on file  Social Needs  . Financial resource strain: Not on file  . Food insecurity:    Worry: Not on file    Inability: Not on file  . Transportation needs:    Medical: Not on file    Non-medical: Not on file  Tobacco Use  . Smoking status: Former Research scientist (life sciences)  . Smokeless tobacco: Never Used  Substance and Sexual Activity  . Alcohol use: No    Alcohol/week: 0.0 oz  . Drug use: No  . Sexual activity: Yes  Lifestyle  . Physical activity:    Days per week: Not on file    Minutes per session: Not on file  . Stress: Not on file  Relationships  . Social connections:    Talks on phone: Not on file    Gets together: Not on file    Attends religious service: Not on file    Active member of club or organization: Not on file    Attends meetings of clubs or organizations: Not on file    Relationship status: Not on file  . Intimate partner violence:  Fear of current or ex partner: Not on file    Emotionally abused: Not on file    Physically abused: Not on file    Forced sexual activity: Not on file  Other Topics Concern  . Not on file  Social History Narrative  . Not on file   Family History  Problem Relation Age of Onset  . Heart disease Father   . Cancer Sister        lung  . Cancer Brother        colon  . Diabetes Brother   . Stroke Maternal Grandmother   . Cancer Maternal Grandfather        lung  . Cancer Sister        lung  . COPD Sister   . Heart disease Brother    Past Medical History:  Diagnosis Date  . Allergic rhinitis   . Anxiety   . Depression   . Hyperlipidemia   . Hypertension   . Menopause   . Microscopic hematuria   . Vaginal atrophy    Past Surgical History:  Procedure Laterality Date  . BREAST SURGERY     cyst removed  . TONSILLECTOMY    . TUMOR EXCISION Right    kidney    Relevant past medical, surgical, family and social history  reviewed and updated as indicated. Interim medical history since our last visit reviewed. Allergies and medications reviewed and updated.  Review of Systems  Constitutional: Negative.   HENT: Negative.   Eyes: Negative.   Respiratory: Negative.   Cardiovascular: Negative.   Gastrointestinal: Negative.   Endocrine: Negative.   Genitourinary: Negative.   Musculoskeletal:       Takes Meloxicam on occasion for right should tendonitis.    Allergic/Immunologic: Negative.   Neurological: Negative.   Hematological: Negative.   Psychiatric/Behavioral: Negative.     Per HPI unless specifically indicated above     Objective:    BP 121/77   Pulse (!) 59   Temp 97.6 F (36.4 C) (Oral)   Ht 5' 5.8" (1.671 m)   Wt 161 lb 6.4 oz (73.2 kg)   LMP 06/30/1991 (Approximate)   SpO2 97%   BMI 26.21 kg/m   Wt Readings from Last 3 Encounters:  08/17/17 161 lb 6.4 oz (73.2 kg)  01/11/17 160 lb 6.4 oz (72.8 kg)  09/18/16 158 lb 6.4 oz (71.8 kg)    Physical Exam  Constitutional: She is oriented to person, place, and time. She appears well-developed and well-nourished.  HENT:  Head: Normocephalic and atraumatic.  Eyes: Pupils are equal, round, and reactive to light. Right eye exhibits no discharge. Left eye exhibits no discharge. No scleral icterus.  Neck: Normal range of motion. Neck supple. Carotid bruit is not present. No thyromegaly present.  Cardiovascular: Normal rate, regular rhythm and normal heart sounds. Exam reveals no gallop and no friction rub.  No murmur heard. Pulmonary/Chest: Effort normal and breath sounds normal. No respiratory distress. She has no wheezes. She has no rales. No breast tenderness or discharge.  Abdominal: Soft. Bowel sounds are normal. There is no tenderness. There is no rebound.  Genitourinary: No breast tenderness or discharge.  Musculoskeletal: Normal range of motion.  Lymphadenopathy:    She has no cervical adenopathy.  Neurological: She is alert and  oriented to person, place, and time.  Skin: Skin is warm, dry and intact. No rash noted.  Psychiatric: She has a normal mood and affect. Her speech is normal and behavior is normal.  Judgment and thought content normal. Cognition and memory are normal.    Results for orders placed or performed in visit on 08/09/17  CBC with Differential/Platelet  Result Value Ref Range   WBC 3.4 3.4 - 10.8 x10E3/uL   RBC 4.41 3.77 - 5.28 x10E6/uL   Hemoglobin 12.8 11.1 - 15.9 g/dL   Hematocrit 40.1 34.0 - 46.6 %   MCV 91 79 - 97 fL   MCH 29.0 26.6 - 33.0 pg   MCHC 31.9 31.5 - 35.7 g/dL   RDW 13.7 12.3 - 15.4 %   Platelets 224 150 - 379 x10E3/uL   Neutrophils 50 Not Estab. %   Lymphs 34 Not Estab. %   Monocytes 13 Not Estab. %   Eos 3 Not Estab. %   Basos 0 Not Estab. %   Neutrophils Absolute 1.7 1.4 - 7.0 x10E3/uL   Lymphocytes Absolute 1.2 0.7 - 3.1 x10E3/uL   Monocytes Absolute 0.4 0.1 - 0.9 x10E3/uL   EOS (ABSOLUTE) 0.1 0.0 - 0.4 x10E3/uL   Basophils Absolute 0.0 0.0 - 0.2 x10E3/uL   Immature Granulocytes 0 Not Estab. %   Immature Grans (Abs) 0.0 0.0 - 0.1 x10E3/uL  Lipid Panel w/o Chol/HDL Ratio  Result Value Ref Range   Cholesterol, Total 210 (H) 100 - 199 mg/dL   Triglycerides 129 0 - 149 mg/dL   HDL 63 >39 mg/dL   VLDL Cholesterol Cal 26 5 - 40 mg/dL   LDL Calculated 121 (H) 0 - 99 mg/dL  Comprehensive metabolic panel  Result Value Ref Range   Glucose 95 65 - 99 mg/dL   BUN 17 8 - 27 mg/dL   Creatinine, Ser 0.83 0.57 - 1.00 mg/dL   GFR calc non Af Amer 71 >59 mL/min/1.73   GFR calc Af Amer 82 >59 mL/min/1.73   BUN/Creatinine Ratio 20 12 - 28   Sodium 144 134 - 144 mmol/L   Potassium 3.9 3.5 - 5.2 mmol/L   Chloride 103 96 - 106 mmol/L   CO2 28 20 - 29 mmol/L   Calcium 9.3 8.7 - 10.3 mg/dL   Total Protein 6.7 6.0 - 8.5 g/dL   Albumin 4.1 3.5 - 4.8 g/dL   Globulin, Total 2.6 1.5 - 4.5 g/dL   Albumin/Globulin Ratio 1.6 1.2 - 2.2   Bilirubin Total 0.5 0.0 - 1.2 mg/dL   Alkaline  Phosphatase 68 39 - 117 IU/L   AST 20 0 - 40 IU/L   ALT 16 0 - 32 IU/L      Assessment & Plan:   Problem List Items Addressed This Visit      Unprioritized   Advance care planning    Has filled out forms.  No changes made      Depression, major, single episode, complete remission (HCC)    Stable.  Continue Fluoxetine 20-30 mg/day      Relevant Medications   FLUoxetine (PROZAC) 10 MG capsule   GERD (gastroesophageal reflux disease)    Stable.  Discussed antacids on a prn basis rather than continuing Omeprazole      Relevant Medications   omeprazole (PRILOSEC) 20 MG capsule   Hyperlipidemia    Last LDL was 120.  Taking a 1/2 pill of Simvastatin.  Increase to a whole pill      Relevant Medications   simvastatin (ZOCOR) 20 MG tablet   hydrochlorothiazide (HYDRODIURIL) 25 MG tablet   Osteopenia    Encouraged exercise and Vit D supplementation.  Calcium from diet rich in Calcium.  Other Visit Diagnoses    Colon cancer screening    -  Primary   On a 5 year schedule.  Referral made   Relevant Orders   Ambulatory referral to Gastroenterology   MM DIGITAL SCREENING BILATERAL   Annual physical exam           Follow up plan: Return in about 6 months (around 02/17/2018).

## 2017-08-17 NOTE — Assessment & Plan Note (Addendum)
Encouraged exercise and Vit D supplementation.  Calcium from diet rich in Calcium.

## 2017-08-17 NOTE — Assessment & Plan Note (Addendum)
Stable.  Continue Fluoxetine 20-30 mg/day

## 2017-08-17 NOTE — Patient Instructions (Signed)
Preventive Care 72 Years and Older, Female Preventive care refers to lifestyle choices and visits with your health care provider that can promote health and wellness. What does preventive care include?  A yearly physical exam. This is also called an annual well check.  Dental exams once or twice a year.  Routine eye exams. Ask your health care provider how often you should have your eyes checked.  Personal lifestyle choices, including: ? Daily care of your teeth and gums. ? Regular physical activity. ? Eating a healthy diet. ? Avoiding tobacco and drug use. ? Limiting alcohol use. ? Practicing safe sex. ? Taking low-dose aspirin every day. ? Taking vitamin and mineral supplements as recommended by your health care provider. What happens during an annual well check? The services and screenings done by your health care provider during your annual well check will depend on your age, overall health, lifestyle risk factors, and family history of disease. Counseling Your health care provider may ask you questions about your:  Alcohol use.  Tobacco use.  Drug use.  Emotional well-being.  Home and relationship well-being.  Sexual activity.  Eating habits.  History of falls.  Memory and ability to understand (cognition).  Work and work environment.  Reproductive health.  Screening You may have the following tests or measurements:  Height, weight, and BMI.  Blood pressure.  Lipid and cholesterol levels. These may be checked every 5 years, or more frequently if you are over 50 years old.  Skin check.  Lung cancer screening. You may have this screening every year starting at age 55 if you have a 30-pack-year history of smoking and currently smoke or have quit within the past 15 years.  Fecal occult blood test (FOBT) of the stool. You may have this test every year starting at age 50.  Flexible sigmoidoscopy or colonoscopy. You may have a sigmoidoscopy every 5 years or  a colonoscopy every 10 years starting at age 50.  Hepatitis C blood test.  Hepatitis B blood test.  Sexually transmitted disease (STD) testing.  Diabetes screening. This is done by checking your blood sugar (glucose) after you have not eaten for a while (fasting). You may have this done every 1-3 years.  Bone density scan. This is done to screen for osteoporosis. You may have this done starting at age 65.  Mammogram. This may be done every 1-2 years. Talk to your health care provider about how often you should have regular mammograms.  Talk with your health care provider about your test results, treatment options, and if necessary, the need for more tests. Vaccines Your health care provider may recommend certain vaccines, such as:  Influenza vaccine. This is recommended every year.  Tetanus, diphtheria, and acellular pertussis (Tdap, Td) vaccine. You may need a Td booster every 10 years.  Varicella vaccine. You may need this if you have not been vaccinated.  Zoster vaccine. You may need this after age 60.  Measles, mumps, and rubella (MMR) vaccine. You may need at least one dose of MMR if you were born in 1957 or later. You may also need a second dose.  Pneumococcal 13-valent conjugate (PCV13) vaccine. One dose is recommended after age 65.  Pneumococcal polysaccharide (PPSV23) vaccine. One dose is recommended after age 65.  Meningococcal vaccine. You may need this if you have certain conditions.  Hepatitis A vaccine. You may need this if you have certain conditions or if you travel or work in places where you may be exposed to hepatitis   A.  Hepatitis B vaccine. You may need this if you have certain conditions or if you travel or work in places where you may be exposed to hepatitis B.  Haemophilus influenzae type b (Hib) vaccine. You may need this if you have certain conditions.  Talk to your health care provider about which screenings and vaccines you need and how often you  need them. This information is not intended to replace advice given to you by your health care provider. Make sure you discuss any questions you have with your health care provider. Document Released: 04/12/2015 Document Revised: 12/04/2015 Document Reviewed: 01/15/2015 Elsevier Interactive Patient Education  2018 [employer]vier Inc.  

## 2017-08-17 NOTE — Assessment & Plan Note (Signed)
Last LDL was 120.  Taking a 1/2 pill of Simvastatin.  Increase to a whole pill

## 2017-08-17 NOTE — Assessment & Plan Note (Signed)
Stable.  Discussed antacids on a prn basis rather than continuing Omeprazole

## 2017-08-17 NOTE — Assessment & Plan Note (Signed)
Has filled out forms.  No changes made

## 2017-11-02 ENCOUNTER — Ambulatory Visit
Admission: RE | Admit: 2017-11-02 | Discharge: 2017-11-02 | Disposition: A | Discharge status: Home / Self Care | Payer: Medicare Other | Source: Ambulatory Visit | Attending: Gastroenterology | Admitting: Gastroenterology

## 2017-11-02 ENCOUNTER — Encounter: Payer: Self-pay | Admitting: *Deleted

## 2017-11-02 ENCOUNTER — Ambulatory Visit: Payer: Medicare Other | Admitting: Anesthesiology

## 2017-11-02 ENCOUNTER — Other Ambulatory Visit: Payer: Self-pay

## 2017-11-02 ENCOUNTER — Encounter
Admission: RE | Disposition: A | Discharge status: Home / Self Care | Payer: Self-pay | Source: Ambulatory Visit | Attending: Gastroenterology

## 2017-11-02 DIAGNOSIS — Z79899 Other long term (current) drug therapy: Secondary | ICD-10-CM | POA: Insufficient documentation

## 2017-11-02 DIAGNOSIS — Z888 Allergy status to other drugs, medicaments and biological substances status: Secondary | ICD-10-CM | POA: Insufficient documentation

## 2017-11-02 DIAGNOSIS — I10 Essential (primary) hypertension: Secondary | ICD-10-CM | POA: Insufficient documentation

## 2017-11-02 DIAGNOSIS — Z882 Allergy status to sulfonamides status: Secondary | ICD-10-CM | POA: Insufficient documentation

## 2017-11-02 DIAGNOSIS — Z800 Family history of malignant neoplasm of digestive organs: Secondary | ICD-10-CM | POA: Diagnosis present

## 2017-11-02 DIAGNOSIS — F419 Anxiety disorder, unspecified: Secondary | ICD-10-CM | POA: Insufficient documentation

## 2017-11-02 DIAGNOSIS — K219 Gastro-esophageal reflux disease without esophagitis: Secondary | ICD-10-CM | POA: Insufficient documentation

## 2017-11-02 DIAGNOSIS — D121 Benign neoplasm of appendix: Secondary | ICD-10-CM | POA: Insufficient documentation

## 2017-11-02 DIAGNOSIS — K5730 Diverticulosis of large intestine without perforation or abscess without bleeding: Secondary | ICD-10-CM | POA: Insufficient documentation

## 2017-11-02 DIAGNOSIS — F329 Major depressive disorder, single episode, unspecified: Secondary | ICD-10-CM | POA: Diagnosis not present

## 2017-11-02 DIAGNOSIS — Z1211 Encounter for screening for malignant neoplasm of colon: Secondary | ICD-10-CM | POA: Insufficient documentation

## 2017-11-02 DIAGNOSIS — Z79890 Hormone replacement therapy (postmenopausal): Secondary | ICD-10-CM | POA: Insufficient documentation

## 2017-11-02 DIAGNOSIS — E785 Hyperlipidemia, unspecified: Secondary | ICD-10-CM | POA: Insufficient documentation

## 2017-11-02 DIAGNOSIS — Z87891 Personal history of nicotine dependence: Secondary | ICD-10-CM | POA: Insufficient documentation

## 2017-11-02 DIAGNOSIS — Z7982 Long term (current) use of aspirin: Secondary | ICD-10-CM | POA: Diagnosis not present

## 2017-11-02 DIAGNOSIS — D122 Benign neoplasm of ascending colon: Secondary | ICD-10-CM | POA: Insufficient documentation

## 2017-11-02 HISTORY — DX: Hematuria, unspecified: R31.9

## 2017-11-02 HISTORY — DX: Dyspnea, unspecified: R06.00

## 2017-11-02 HISTORY — DX: Angina pectoris, unspecified: I20.9

## 2017-11-02 HISTORY — PX: COLONOSCOPY WITH PROPOFOL: SHX5780

## 2017-11-02 HISTORY — DX: Gastro-esophageal reflux disease without esophagitis: K21.9

## 2017-11-02 SURGERY — COLONOSCOPY WITH PROPOFOL
Anesthesia: General

## 2017-11-02 MED ORDER — PROPOFOL 500 MG/50ML IV EMUL
INTRAVENOUS | Status: AC
  Filled 2017-11-02: qty 50

## 2017-11-02 MED ORDER — MIDAZOLAM HCL 2 MG/2ML IJ SOLN
INTRAMUSCULAR | Status: DC | PRN
  Administered 2017-11-02: 2 mg via INTRAVENOUS

## 2017-11-02 MED ORDER — SODIUM CHLORIDE 0.9 % IV SOLN
INTRAVENOUS | Status: DC
  Administered 2017-11-02: 11:00:00 via INTRAVENOUS

## 2017-11-02 MED ORDER — EPHEDRINE SULFATE 50 MG/ML IJ SOLN
INTRAMUSCULAR | Status: AC
  Filled 2017-11-02: qty 1

## 2017-11-02 MED ORDER — MIDAZOLAM HCL 2 MG/2ML IJ SOLN
INTRAMUSCULAR | Status: AC
  Filled 2017-11-02: qty 2

## 2017-11-02 MED ORDER — FENTANYL CITRATE (PF) 100 MCG/2ML IJ SOLN
INTRAMUSCULAR | Status: AC
  Filled 2017-11-02: qty 2

## 2017-11-02 MED ORDER — FENTANYL CITRATE (PF) 100 MCG/2ML IJ SOLN
INTRAMUSCULAR | Status: DC | PRN
  Administered 2017-11-02: 50 ug via INTRAVENOUS

## 2017-11-02 MED ORDER — PROPOFOL 500 MG/50ML IV EMUL
INTRAVENOUS | Status: DC | PRN
  Administered 2017-11-02: 100 ug/kg/min via INTRAVENOUS

## 2017-11-02 MED ORDER — EPHEDRINE SULFATE 50 MG/ML IJ SOLN
INTRAMUSCULAR | Status: DC | PRN
  Administered 2017-11-02 (×3): 5 mg via INTRAVENOUS

## 2017-11-02 NOTE — Anesthesia Post-op Follow-up Note (Signed)
Anesthesia QCDR form completed.        

## 2017-11-02 NOTE — H&P (Signed)
Outpatient short stay form Pre-procedure 11/02/2017 10:28 AM Lollie Sails MD  Primary Physician: Kathrine Haddock, NP  Reason for visit: Colonoscopy  History of present illness: Patient is a 72 year old female presenting today as above.  There is a family history of colon cancer primary relative, brother.  Patient's last colonoscopy was in 2013.  She had some diverticulosis but no polyps at that time.  She occasionally will take 81 mg aspirin but no other aspirin products or blood thinning agent.    Current Facility-Administered Medications:  .  0.9 %  sodium chloride infusion, , Intravenous, Continuous, Lollie Sails, MD .  0.9 %  sodium chloride infusion, , Intravenous, Continuous, Lollie Sails, MD  Medications Prior to Admission  Medication Sig Dispense Refill Last Dose  . aspirin EC 81 MG tablet Take 81 mg by mouth daily.   Past Week at Unknown time  . calcium-vitamin D (OSCAL WITH D) 500-200 MG-UNIT tablet Take 1 tablet by mouth.   Past Week at Unknown time  . FLUoxetine (PROZAC) 10 MG capsule Take 3 capsules (30 mg total) by mouth daily. 270 capsule 2 Past Week at Unknown time  . hydrochlorothiazide (HYDRODIURIL) 25 MG tablet Take 1 tablet (25 mg total) by mouth daily as needed. 30 tablet 2 Past Week at Unknown time  . meloxicam (MOBIC) 15 MG tablet Take 15 mg by mouth daily.  0 Past Week at Unknown time  . Multiple Vitamin (MULTIVITAMIN) tablet Take 1 tablet by mouth daily.   Past Week at Unknown time  . omeprazole (PRILOSEC) 20 MG capsule Take 1 capsule (20 mg total) by mouth daily. 90 capsule 1 Past Week at Unknown time  . simvastatin (ZOCOR) 20 MG tablet Take 1 tablet (20 mg total) by mouth at bedtime. 90 tablet 3 Past Week at Unknown time  . estrogen, conjugated,-medroxyprogesterone (PREMPRO) 0.45-1.5 MG tablet Take 1 tablet by mouth daily.   Not Taking at Unknown time     Allergies  Allergen Reactions  . Cymbalta [Duloxetine Hcl] Other (See Comments)   constipation  . Sulfa Antibiotics Rash     Past Medical History:  Diagnosis Date  . Allergic rhinitis   . Anginal pain ([place])   . Anxiety   . Depression   . Dyspnea   . GERD (gastroesophageal reflux disease)   . Hematuria   . Hyperlipidemia   . Hypertension   . Menopause   . Microscopic hematuria   . Vaginal atrophy     Review of systems:      Physical Exam    Heart and lungs: Without rub or gallop, lungs are bilaterally clear.    HEENT: Normocephalic atraumatic eyes are anicteric    Other:    Pertinant exam for procedure: Soft nontender nondistended bowel sounds positive normoactive.    Planned proceedures: Colonoscopy and indicated procedures. I have discussed the risks benefits and complications of procedures to include not limited to bleeding, infection, perforation and the risk of sedation and the patient wishes to proceed.    Lollie Sails, MD Gastroenterology 11/02/2017  10:28 AM

## 2017-11-02 NOTE — Transfer of Care (Signed)
Immediate Anesthesia Transfer of Care Note  Patient: Connie Morrison  Procedure(s) Performed: COLONOSCOPY WITH PROPOFOL (N/A )  Patient Location: PACU  Anesthesia Type:General  Level of Consciousness: awake and sedated  Airway & Oxygen Therapy: Patient Spontanous Breathing and Patient connected to nasal cannula oxygen  Post-op Assessment: Report given to RN and Post -op Vital signs reviewed and stable  Post vital signs: Reviewed and stable  Last Vitals:  Vitals Value Taken Time  BP    Temp    Pulse    Resp    SpO2      Last Pain:  Vitals:   11/02/17 1002  TempSrc: Tympanic  PainSc: 0-No pain         Complications: No apparent anesthesia complications

## 2017-11-02 NOTE — Anesthesia Preprocedure Evaluation (Signed)
Anesthesia Evaluation  Patient identified by MRN, date of birth, ID band Patient awake    Reviewed: Allergy & Precautions, H&P , NPO status , Patient's Chart, lab work & pertinent test results, reviewed documented beta blocker date and time   History of Anesthesia Complications Negative for: history of anesthetic complications  Airway Mallampati: I  TM Distance: >3 FB Neck ROM: full    Dental  (+) Dental Advidsory Given, Caps, Teeth Intact, Missing, Partial Lower   Pulmonary neg pulmonary ROS, former smoker,           Cardiovascular Exercise Tolerance: Good negative cardio ROS       Neuro/Psych negative neurological ROS  negative psych ROS   GI/Hepatic Neg liver ROS, GERD  ,  Endo/Other  negative endocrine ROS  Renal/GU negative Renal ROS  negative genitourinary   Musculoskeletal   Abdominal   Peds  Hematology negative hematology ROS (+)   Anesthesia Other Findings Past Medical History: No date: Allergic rhinitis No date: Anginal pain ([place]) No date: Anxiety No date: Depression No date: Dyspnea No date: GERD (gastroesophageal reflux disease) No date: Hematuria No date: Hyperlipidemia No date: Hypertension No date: Menopause No date: Microscopic hematuria No date: Vaginal atrophy   Reproductive/Obstetrics negative OB ROS                             Anesthesia Physical Anesthesia Plan  ASA: II  Anesthesia Plan: General   Post-op Pain Management:    Induction: Intravenous  PONV Risk Score and Plan: 3 and Propofol infusion and TIVA  Airway Management Planned: Nasal Cannula  Additional Equipment:   Intra-op Plan:   Post-operative Plan:   Informed Consent: I have reviewed the patients History and Physical, chart, labs and discussed the procedure including the risks, benefits and alternatives for the proposed anesthesia with the patient or authorized representative who  has indicated his/her understanding and acceptance.   Dental Advisory Given  Plan Discussed with: Anesthesiologist, CRNA and Surgeon  Anesthesia Plan Comments:         Anesthesia Quick Evaluation

## 2017-11-02 NOTE — Op Note (Signed)
[facility] Gastroenterology Patient Name: Connie Morrison Procedure Date: 11/02/2017 10:50 AM MRN: [id number] Account #: [id number] Date of Birth: 02-06-1946 Admit Type: Outpatient Age: 72 Room: [facility] ENDO ROOM 3 Gender: Female Note Status: Finalized Procedure:            Colonoscopy Indications:          Family history of colon cancer in a first-degree                        relative Providers:            Lollie Sails, MD Referring MD:         Kathrine Haddock (Referring MD) Medicines:            Monitored Anesthesia Care Complications:        No immediate complications. Procedure:            Pre-Anesthesia Assessment:                       - ASA Grade Assessment: II - A patient with mild                        systemic disease.                       After obtaining informed consent, the colonoscope was                        passed under direct vision. Throughout the procedure,                        the patient's blood pressure, pulse, and oxygen                        saturations were monitored continuously. The                        Colonoscope was introduced through the anus and                        advanced to the the cecum, identified by appendiceal                        orifice and ileocecal valve. The colonoscopy was                        performed without difficulty. The patient tolerated the                        procedure well. The quality of the bowel preparation                        was good. Findings:      Multiple small-mouthed diverticula were found in the sigmoid colon,       descending colon, transverse colon and ascending colon.      A 7 mm polyp was found adjacent to the appendiceal orifice. The polyp       was sessile. Polypectomy was attempted, initially using a cold snare.       Polyp resection was incomplete with this device. This intervention then       required a  different device and polypectomy technique. The polyp was   removed with a cold biopsy forceps. Resection and retrieval were       complete.      A 20 mm polyp was found in the proximal ascending colon. The polyp was       sessile. Polypectomy was attempted, initially using a lift and cut       technique with a cold snare. Polyp resection was incomplete with this       device. This intervention then required a different device and       polypectomy technique. The polyp was removed with a cold biopsy forceps.       Resection and retrieval were complete. To prevent bleeding after the       polypectomy, three hemostatic clips were successfully placed (MR       conditional). There was no bleeding at the end of the maneuver.      The exam was otherwise normal throughout the examined colon.      The digital rectal exam was normal. Impression:           - Diverticulosis in the sigmoid colon, in the                        descending colon, in the transverse colon and in the                        ascending colon.                       - One 7 mm polyp at the appendiceal orifice, removed                        with a cold biopsy forceps. Resected and retrieved.                       - One 20 mm polyp in the proximal ascending colon,                        removed with a cold biopsy forceps. Resected and                        retrieved. Clips (MR conditional) were placed. Recommendation:       - Discharge patient to home.                       - Await pathology results.                       - Full liquid diet today.                       - Soft diet for 2 days, then advance as tolerated to                        advance diet as tolerated. Procedure Code(s):    --- Professional ---                       [phone], Colonoscopy, flexible; with biopsy, single or                        multiple  Diagnosis Code(s):    --- Professional ---                       D12.1, Benign neoplasm of appendix                       D12.2, Benign neoplasm of ascending colon                        Z80.0, Family history of malignant neoplasm of                        digestive organs                       K57.30, Diverticulosis of large intestine without                        perforation or abscess without bleeding CPT copyright 2017 American [employer] Association. All rights reserved. The codes documented in this report are preliminary and upon coder review may  be revised to meet current compliance requirements. Lollie Sails, MD 11/02/2017 11:39:25 AM This report has been signed electronically. Number of Addenda: 0 Note Initiated On: 11/02/2017 10:50 AM Scope Withdrawal Time: 0 hours 26 minutes 8 seconds  Total Procedure Duration: 0 hours 35 minutes 25 seconds       [facility]

## 2017-11-02 NOTE — Anesthesia Procedure Notes (Signed)
Performed by: Cook-Martin, Fern Canova Pre-anesthesia Checklist: Patient identified, Emergency Drugs available, Suction available, Patient being monitored and Timeout performed Patient Re-evaluated:Patient Re-evaluated prior to induction Oxygen Delivery Method: Nasal cannula Preoxygenation: Pre-oxygenation with 100% oxygen Induction Type: IV induction Placement Confirmation: positive ETCO2 and CO2 detector       

## 2017-11-03 ENCOUNTER — Encounter: Payer: Self-pay | Admitting: Gastroenterology

## 2017-11-03 LAB — SURGICAL PATHOLOGY

## 2017-11-03 NOTE — Anesthesia Postprocedure Evaluation (Signed)
Anesthesia Post Note  Patient: Connie Morrison  Procedure(s) Performed: COLONOSCOPY WITH PROPOFOL (N/A )  Patient location during evaluation: Endoscopy Anesthesia Type: General Level of consciousness: awake and alert Pain management: pain level controlled Vital Signs Assessment: post-procedure vital signs reviewed and stable Respiratory status: spontaneous breathing, nonlabored ventilation, respiratory function stable and patient connected to nasal cannula oxygen Cardiovascular status: blood pressure returned to baseline and stable Postop Assessment: no apparent nausea or vomiting Anesthetic complications: no     Last Vitals:  Vitals:   11/02/17 1225 11/02/17 1235  BP: (!) 143/63   Pulse: (!) 59 63  Resp: 15 (!) 21  Temp:    SpO2: 100% 99%    Last Pain:  Vitals:   11/03/17 0727  TempSrc:   PainSc: 0-No pain                 Martha Clan

## 2018-01-10 ENCOUNTER — Ambulatory Visit: Payer: Medicare Other | Admitting: Family Medicine

## 2018-01-10 ENCOUNTER — Encounter: Payer: Self-pay | Admitting: Family Medicine

## 2018-01-10 VITALS — BP 153/71 | HR 59 | Temp 98.4°F | Ht 65.8 in | Wt 157.8 lb

## 2018-01-10 DIAGNOSIS — Z23 Encounter for immunization: Secondary | ICD-10-CM

## 2018-01-10 DIAGNOSIS — R309 Painful micturition, unspecified: Secondary | ICD-10-CM | POA: Diagnosis not present

## 2018-01-10 DIAGNOSIS — N898 Other specified noninflammatory disorders of vagina: Secondary | ICD-10-CM | POA: Diagnosis not present

## 2018-01-10 LAB — UA/M W/RFLX CULTURE, ROUTINE
BILIRUBIN UA: NEGATIVE
GLUCOSE, UA: NEGATIVE
KETONES UA: NEGATIVE
LEUKOCYTES UA: NEGATIVE
NITRITE UA: NEGATIVE
Protein, UA: NEGATIVE
Urobilinogen, Ur: 0.2 mg/dL (ref 0.2–1.0)
pH, UA: 6.5 (ref 5.0–7.5)

## 2018-01-10 LAB — MICROSCOPIC EXAMINATION: BACTERIA UA: NONE SEEN

## 2018-01-10 LAB — WET PREP FOR TRICH, YEAST, CLUE
CLUE CELL EXAM: NEGATIVE
Trichomonas Exam: NEGATIVE
Yeast Exam: NEGATIVE

## 2018-01-10 NOTE — Patient Instructions (Signed)
Follow up as needed

## 2018-01-10 NOTE — Progress Notes (Signed)
BP (!) 153/71   Pulse (!) 59   Temp 98.4 F (36.9 C) (Oral)   Ht 5' 5.8" (1.671 m)   Wt 157 lb 12.8 oz (71.6 kg)   LMP 06/30/1991 (Approximate)   SpO2 98%   BMI 25.62 kg/m    Subjective:    Patient ID: Connie Morrison, female    DOB: June 12, 1945, 72 y.o.   MRN: [id number]  HPI: Connie Morrison is a 72 y.o. female  Chief Complaint  Patient presents with  . Urinary Tract Infection    pt states she has had an uncomfortable and pain like feeling when having to urinate. States this has been going on since Saturday.    Dysuria, urgency, dark urine, and vaginal irritation x 3 days. Not trying anything for sxs. Does have a hx of UTIs. Denies fevers, chills, back pain, discharge, vaginal itching.   Relevant past medical, surgical, family and social history reviewed and updated as indicated. Interim medical history since our last visit reviewed. Allergies and medications reviewed and updated.  Review of Systems  Per HPI unless specifically indicated above     Objective:    BP (!) 153/71   Pulse (!) 59   Temp 98.4 F (36.9 C) (Oral)   Ht 5' 5.8" (1.671 m)   Wt 157 lb 12.8 oz (71.6 kg)   LMP 06/30/1991 (Approximate)   SpO2 98%   BMI 25.62 kg/m   Wt Readings from Last 3 Encounters:  01/10/18 157 lb 12.8 oz (71.6 kg)  11/02/17 158 lb (71.7 kg)  08/17/17 161 lb 6.4 oz (73.2 kg)    Physical Exam  Constitutional: She is oriented to person, place, and time. She appears well-developed and well-nourished. No distress.  HENT:  Head: Atraumatic.  Eyes: Conjunctivae and EOM are normal.  Neck: Normal range of motion. Neck supple.  Cardiovascular: Normal rate, regular rhythm and normal heart sounds.  Pulmonary/Chest: Effort normal and breath sounds normal.  Musculoskeletal: Normal range of motion. She exhibits no tenderness (no cva ttp b/l).  Neurological: She is alert and oriented to person, place, and time.  Skin: Skin is warm and dry.  Psychiatric: She has a normal mood and  affect. Her behavior is normal.  Nursing note and vitals reviewed.   Results for orders placed or performed during the hospital encounter of 11/02/17  Surgical pathology  Result Value Ref Range   SURGICAL PATHOLOGY      Surgical Pathology CASE: [id number] PATIENT: Kandace Parkins Surgical Pathology Report     SPECIMEN SUBMITTED: A. Colon polyp, peri-appendiceal;cold snare B. Colon polyp, proximal ascending;cold snare  CLINICAL HISTORY: None provided  PRE-OPERATIVE DIAGNOSIS: Screening, Family HX colon cancer  POST-OPERATIVE DIAGNOSIS: Diverticulosis, colon polyps, hemorrhoids     DIAGNOSIS: A.  COLON POLYP, PERIAPPENDICEAL; COLD SNARE: - SERRATED POLYP, 2 FRAGMENTS, SEE COMMENT. - NEGATIVE FOR DYSPLASIA AND MALIGNANCY.  B.  COLON POLYP, PROXIMAL ASCENDING; COLD SNARE: - SESSILE SERRATED ADENOMA, MULTIPLE FRAGMENTS, SEE COMMENT. - NEGATIVE FOR CYTOLOGIC DYSPLASIA AND MALIGNANCY.  Comment: The periappendiceal polyp (A) does not show features diagnostic of sessile serrated adenoma, but that diagnosis is not excluded given the clinical polyp size of 7 mm and its location.  Clinical correlation is required to assess completeness of removal of both lesions.   GROSS D ESCRIPTION: A. Labeled: Cold snare peri-appendiceal polyp Received: In formalin Tissue fragment(s): 2 Size: 0.3 and 0.5 cm Description: Pink-tan tissue fragments Entirely submitted in one cassette.  B. Labeled: Cold snare proximal ascending colon polyp  Received: In formalin Tissue fragment(s): Multiple Size: Aggregate, 1.5 x 0.9 x 0.1 cm Description: Pink-tan tissue fragments Entirely submitted in one cassette.   Final Diagnosis performed by Bryan Lemma, MD.   Electronically signed 11/03/2017 8:40:24PM The electronic signature indicates that the named Attending Pathologist has evaluated the specimen  Technical component performed at [facility], [address], [place], [place] [address] Lab:  [phone] Dir: Rush Farmer, MD, MMM  Professional component performed at [facility], [facility], [place], [place], [place] [address] Lab: [phone] Dir: Dellia Nims. Reuel Derby, MD       Assessment & Plan:   Problem List Items Addressed This Visit    None    Visit Diagnoses    Painful urination    -  Primary   U/A benign today. Continue to push fluids, probiotics recommended. F/u if worsening or not improving   Relevant Orders   UA/M w/rflx Culture, Routine   Need for influenza vaccination       Relevant Orders   Flu vaccine HIGH DOSE PF (Completed)   Vaginal irritation       Wet prep neg, probiotics, good vaginal hygiene reviewed. F/u if not improving or sxs worsening   Relevant Orders   WET PREP FOR [place], YEAST, CLUE       Follow up plan: Return if symptoms worsen or fail to improve.

## 2018-02-18 ENCOUNTER — Ambulatory Visit: Payer: Medicare Other | Admitting: Unknown Physician Specialty

## 2018-06-19 ENCOUNTER — Other Ambulatory Visit: Payer: Self-pay | Admitting: Unknown Physician Specialty

## 2018-06-24 ENCOUNTER — Other Ambulatory Visit: Payer: Self-pay | Admitting: Unknown Physician Specialty

## 2018-08-23 ENCOUNTER — Encounter: Payer: Medicare Other | Admitting: Family Medicine

## 2018-09-19 ENCOUNTER — Other Ambulatory Visit: Payer: Self-pay | Admitting: Unknown Physician Specialty

## 2018-09-19 NOTE — Telephone Encounter (Signed)
Will provide enough refill to make until 11/03/18 appointment with Dr. Wynetta Emery.  For further refills will need to attend this appointment.

## 2018-09-19 NOTE — Telephone Encounter (Signed)
Please advise 

## 2018-09-25 ENCOUNTER — Other Ambulatory Visit: Payer: Self-pay | Admitting: Nurse Practitioner

## 2018-10-07 ENCOUNTER — Telehealth: Payer: Self-pay | Admitting: Family Medicine

## 2018-10-07 NOTE — Telephone Encounter (Signed)
[place] from [place] called stating she did a peripheral auditory disease test and patient had mild disease in right leg. [place] call back [phone]

## 2018-10-07 NOTE — Telephone Encounter (Signed)
Pt called in to reschedule physical also needing to reschedule wellness visit. Transferring to nurse.

## 2018-10-07 NOTE — Telephone Encounter (Signed)
Noted.  Patient has not been seen in office for chronic illness in a year, can we call to schedule an annual physical or chronic disease follow-up and I can further evaluate patient.  Thank you.

## 2018-10-11 NOTE — Telephone Encounter (Signed)
Pt scheduled for CPE 12/01/2018.

## 2018-10-19 NOTE — Telephone Encounter (Signed)
Called patient, she states she just had one done with her insurance company, she will skip this years in the office.

## 2018-10-27 ENCOUNTER — Other Ambulatory Visit: Payer: Self-pay

## 2018-10-27 ENCOUNTER — Encounter: Payer: Self-pay | Admitting: *Deleted

## 2018-10-27 NOTE — Discharge Instructions (Signed)

## 2018-10-28 ENCOUNTER — Other Ambulatory Visit
Admission: RE | Admit: 2018-10-28 | Discharge: 2018-10-28 | Disposition: A | Discharge status: Home / Self Care | Payer: Medicare Other | Source: Ambulatory Visit | Attending: Ophthalmology | Admitting: Ophthalmology

## 2018-10-28 DIAGNOSIS — Z20828 Contact with and (suspected) exposure to other viral communicable diseases: Secondary | ICD-10-CM | POA: Insufficient documentation

## 2018-10-28 DIAGNOSIS — Z01812 Encounter for preprocedural laboratory examination: Secondary | ICD-10-CM | POA: Diagnosis present

## 2018-10-28 LAB — SARS CORONAVIRUS 2 (TAT 6-24 HRS): SARS Coronavirus 2: NEGATIVE

## 2018-11-02 ENCOUNTER — Ambulatory Visit: Payer: Medicare Other | Admitting: Anesthesiology

## 2018-11-02 ENCOUNTER — Encounter
Admission: RE | Disposition: A | Discharge status: Home / Self Care | Payer: Self-pay | Source: Home / Self Care | Attending: Ophthalmology

## 2018-11-02 ENCOUNTER — Ambulatory Visit
Admission: RE | Admit: 2018-11-02 | Discharge: 2018-11-02 | Disposition: A | Discharge status: Home / Self Care | Payer: Medicare Other | Attending: Ophthalmology | Admitting: Ophthalmology

## 2018-11-02 ENCOUNTER — Other Ambulatory Visit: Payer: Self-pay

## 2018-11-02 DIAGNOSIS — Z79899 Other long term (current) drug therapy: Secondary | ICD-10-CM | POA: Diagnosis not present

## 2018-11-02 DIAGNOSIS — Z87891 Personal history of nicotine dependence: Secondary | ICD-10-CM | POA: Insufficient documentation

## 2018-11-02 DIAGNOSIS — F329 Major depressive disorder, single episode, unspecified: Secondary | ICD-10-CM | POA: Insufficient documentation

## 2018-11-02 DIAGNOSIS — E7800 Pure hypercholesterolemia, unspecified: Secondary | ICD-10-CM | POA: Diagnosis not present

## 2018-11-02 DIAGNOSIS — K219 Gastro-esophageal reflux disease without esophagitis: Secondary | ICD-10-CM | POA: Insufficient documentation

## 2018-11-02 DIAGNOSIS — H2512 Age-related nuclear cataract, left eye: Secondary | ICD-10-CM | POA: Diagnosis present

## 2018-11-02 DIAGNOSIS — Z905 Acquired absence of kidney: Secondary | ICD-10-CM | POA: Diagnosis not present

## 2018-11-02 DIAGNOSIS — I10 Essential (primary) hypertension: Secondary | ICD-10-CM | POA: Diagnosis not present

## 2018-11-02 HISTORY — PX: CATARACT EXTRACTION W/PHACO: SHX586

## 2018-11-02 HISTORY — DX: Presence of dental prosthetic device (complete) (partial): Z97.2

## 2018-11-02 HISTORY — DX: Other complications of anesthesia, initial encounter: T88.59XA

## 2018-11-02 SURGERY — PHACOEMULSIFICATION, CATARACT, WITH IOL INSERTION
Anesthesia: Monitor Anesthesia Care | Site: Eye | Laterality: Left

## 2018-11-02 MED ORDER — ARMC OPHTHALMIC DILATING DROPS
1.0000 "application " | OPHTHALMIC | Status: DC | PRN
  Administered 2018-11-02 (×3): 1 via OPHTHALMIC

## 2018-11-02 MED ORDER — MOXIFLOXACIN HCL 0.5 % OP SOLN
1.0000 [drp] | OPHTHALMIC | Status: DC | PRN
  Administered 2018-11-02 (×3): 1 [drp] via OPHTHALMIC

## 2018-11-02 MED ORDER — TETRACAINE HCL 0.5 % OP SOLN
1.0000 [drp] | OPHTHALMIC | Status: DC | PRN
  Administered 2018-11-02 (×3): 1 [drp] via OPHTHALMIC

## 2018-11-02 MED ORDER — EPINEPHRINE PF 1 MG/ML IJ SOLN
INTRAOCULAR | Status: DC | PRN
  Administered 2018-11-02: 83 mL via OPHTHALMIC

## 2018-11-02 MED ORDER — LIDOCAINE HCL (PF) 2 % IJ SOLN
INTRAOCULAR | Status: DC | PRN
  Administered 2018-11-02: 1 mL

## 2018-11-02 MED ORDER — MIDAZOLAM HCL 2 MG/2ML IJ SOLN
INTRAMUSCULAR | Status: DC | PRN
  Administered 2018-11-02: 2 mg via INTRAVENOUS

## 2018-11-02 MED ORDER — OXYCODONE HCL 5 MG PO TABS: 5.0000 mg | ORAL_TABLET | Freq: Once | ORAL | Status: DC | PRN

## 2018-11-02 MED ORDER — OXYCODONE HCL 5 MG/5ML PO SOLN: 5.0000 mg | Freq: Once | ORAL | Status: DC | PRN

## 2018-11-02 MED ORDER — NA HYALUR & NA CHOND-NA HYALUR 0.4-0.35 ML IO KIT
PACK | INTRAOCULAR | Status: DC | PRN
  Administered 2018-11-02: 1 mL via INTRAOCULAR

## 2018-11-02 MED ORDER — FENTANYL CITRATE (PF) 100 MCG/2ML IJ SOLN
INTRAMUSCULAR | Status: DC | PRN
  Administered 2018-11-02: 50 ug via INTRAVENOUS

## 2018-11-02 MED ORDER — BRIMONIDINE TARTRATE-TIMOLOL 0.2-0.5 % OP SOLN
OPHTHALMIC | Status: DC | PRN
  Administered 2018-11-02: 1 [drp] via OPHTHALMIC

## 2018-11-02 MED ORDER — CEFUROXIME OPHTHALMIC INJECTION 1 MG/0.1 ML
INJECTION | OPHTHALMIC | Status: DC | PRN
  Administered 2018-11-02: 0.1 mL via INTRACAMERAL

## 2018-11-02 SURGICAL SUPPLY — 26 items
CANNULA ANT/CHMB 27G (MISCELLANEOUS) ×1 IMPLANT
CANNULA ANT/CHMB 27GA (MISCELLANEOUS) ×3 IMPLANT
GLOVE SURG LX 7.5 STRW (GLOVE) ×4
GLOVE SURG LX STRL 7.5 STRW (GLOVE) ×1 IMPLANT
GLOVE SURG TRIUMPH 8.0 PF LTX (GLOVE) ×3 IMPLANT
GOWN STRL REUS W/ TWL LRG LVL3 (GOWN DISPOSABLE) ×2 IMPLANT
GOWN STRL REUS W/TWL LRG LVL3 (GOWN DISPOSABLE) ×4
LENS IOL TECNIS ITEC 22.0 (Intraocular Lens) ×2 IMPLANT
MARKER SKIN DUAL TIP RULER LAB (MISCELLANEOUS) ×3 IMPLANT
NDL FILTER BLUNT 18X1 1/2 (NEEDLE) ×1 IMPLANT
NDL RETROBULBAR .5 NSTRL (NEEDLE) IMPLANT
NEEDLE FILTER BLUNT 18X 1/2SAF (NEEDLE) ×2
NEEDLE FILTER BLUNT 18X1 1/2 (NEEDLE) ×1 IMPLANT
PACK CATARACT BRASINGTON (MISCELLANEOUS) ×3 IMPLANT
PACK EYE AFTER SURG (MISCELLANEOUS) ×3 IMPLANT
PACK OPTHALMIC (MISCELLANEOUS) ×3 IMPLANT
RING MALYGIN 7.0 (MISCELLANEOUS) IMPLANT
SUT ETHILON 10-0 CS-B-6CS-B-6 (SUTURE)
SUT VICRYL  9 0 (SUTURE)
SUT VICRYL 9 0 (SUTURE) IMPLANT
SUTURE EHLN 10-0 CS-B-6CS-B-6 (SUTURE) IMPLANT
SYR 3ML LL SCALE MARK (SYRINGE) ×3 IMPLANT
SYR 5ML LL (SYRINGE) ×3 IMPLANT
SYR TB 1ML LUER SLIP (SYRINGE) ×3 IMPLANT
WATER STERILE IRR 500ML POUR (IV SOLUTION) ×3 IMPLANT
WIPE NON LINTING 3.25X3.25 (MISCELLANEOUS) ×3 IMPLANT

## 2018-11-02 NOTE — Anesthesia Procedure Notes (Signed)
Procedure Name: MAC Performed by: Retta Pitcher, CRNA Pre-anesthesia Checklist: Patient identified, Emergency Drugs available, Suction available, Timeout performed and Patient being monitored Patient Re-evaluated:Patient Re-evaluated prior to induction Oxygen Delivery Method: Nasal cannula Placement Confirmation: positive ETCO2       

## 2018-11-02 NOTE — H&P (Signed)

## 2018-11-02 NOTE — Transfer of Care (Signed)
Immediate Anesthesia Transfer of Care Note  Patient: Connie Morrison  Procedure(s) Performed: CATARACT EXTRACTION PHACO AND INTRAOCULAR LENS PLACEMENT (IOC)  LEFT (Left Eye)  Patient Location: PACU  Anesthesia Type: MAC  Level of Consciousness: awake, alert  and patient cooperative  Airway and Oxygen Therapy: Patient Spontanous Breathing and Patient connected to supplemental oxygen  Post-op Assessment: Post-op Vital signs reviewed, Patient's Cardiovascular Status Stable, Respiratory Function Stable, Patent Airway and No signs of Nausea or vomiting  Post-op Vital Signs: Reviewed and stable  Complications: No apparent anesthesia complications

## 2018-11-02 NOTE — Anesthesia Preprocedure Evaluation (Addendum)
Anesthesia Evaluation  Patient identified by MRN, date of birth, ID band Patient awake    Reviewed: Allergy & Precautions, H&P , NPO status , Patient's Chart, lab work & pertinent test results  Airway Mallampati: II  TM Distance: >3 FB Neck ROM: full    Dental  (+) Partial Lower   Pulmonary shortness of breath, former smoker,    Pulmonary exam normal breath sounds clear to auscultation       Cardiovascular hypertension, On Medications  Rhythm:regular Rate:Normal     Neuro/Psych negative neurological ROS     GI/Hepatic Neg liver ROS, GERD  ,  Endo/Other  negative endocrine ROS  Renal/GU negative Renal ROS  negative genitourinary   Musculoskeletal   Abdominal   Peds  Hematology negative hematology ROS (+)   Anesthesia Other Findings   Reproductive/Obstetrics                            Anesthesia Physical Anesthesia Plan  ASA: II  Anesthesia Plan: MAC   Post-op Pain Management:    Induction:   PONV Risk Score and Plan:   Airway Management Planned:   Additional Equipment:   Intra-op Plan:   Post-operative Plan:   Informed Consent: I have reviewed the patients History and Physical, chart, labs and discussed the procedure including the risks, benefits and alternatives for the proposed anesthesia with the patient or authorized representative who has indicated his/her understanding and acceptance.       Plan Discussed with:   Anesthesia Plan Comments:         Anesthesia Quick Evaluation

## 2018-11-02 NOTE — Anesthesia Postprocedure Evaluation (Signed)
Anesthesia Post Note  Patient: Connie Morrison  Procedure(s) Performed: CATARACT EXTRACTION PHACO AND INTRAOCULAR LENS PLACEMENT ([place])  LEFT (Left Eye)  Patient location during evaluation: PACU Anesthesia Type: MAC Level of consciousness: awake and alert Pain management: pain level controlled Vital Signs Assessment: post-procedure vital signs reviewed and stable Respiratory status: spontaneous breathing Cardiovascular status: stable Anesthetic complications: no    Jaci Standard, III,  Mekisha Bittel D

## 2018-11-02 NOTE — Op Note (Signed)
OPERATIVE NOTE  JOURI THREAT [id number] 11/02/2018   PREOPERATIVE DIAGNOSIS:  Nuclear sclerotic cataract left eye. H25.12   POSTOPERATIVE DIAGNOSIS:    Nuclear sclerotic cataract left eye.     PROCEDURE:  Phacoemusification with posterior chamber intraocular lens placement of the left eye   LENS:   Implant Name Type Inv. Item Serial No. Manufacturer Lot No. LRB No. Used Action  LENS IOL DIOP 22.0 - [id number] Intraocular Lens LENS IOL DIOP 22.0 [id number]  Left 1 Implanted        ULTRASOUND TIME: 11  % of 0 minutes 44 seconds, CDE 4.8  SURGEON:  Wyonia Hough, MD   ANESTHESIA:  Topical with tetracaine drops and 2% Xylocaine jelly, augmented with 1% preservative-free intracameral lidocaine.    COMPLICATIONS:  None.   DESCRIPTION OF PROCEDURE:  The patient was identified in the holding room and transported to the operating room and placed in the supine position under the operating microscope.  The left eye was identified as the operative eye and it was prepped and draped in the usual sterile ophthalmic fashion.   A 1 millimeter clear-corneal paracentesis was made at the 1:30 position.  0.5 ml of preservative-free 1% lidocaine was injected into the anterior chamber.  The anterior chamber was filled with Viscoat viscoelastic.  A 2.4 millimeter keratome was used to make a near-clear corneal incision at the 10:30 position.  .  A curvilinear capsulorrhexis was made with a cystotome and capsulorrhexis forceps.  Balanced salt solution was used to hydrodissect and hydrodelineate the nucleus.   Phacoemulsification was then used in stop and chop fashion to remove the lens nucleus and epinucleus.  The remaining cortex was then removed using the irrigation and aspiration handpiece. Provisc was then placed into the capsular bag to distend it for lens placement.  A lens was then injected into the capsular bag.  The remaining viscoelastic was aspirated.   Wounds were hydrated with  balanced salt solution.  The anterior chamber was inflated to a physiologic pressure with balanced salt solution.  No wound leaks were noted. Cefuroxime 0.1 ml of a 10mg /ml solution was injected into the anterior chamber for a dose of 1 mg of intracameral antibiotic at the completion of the case.   Timolol and Brimonidine drops were applied to the eye.  The patient was taken to the recovery room in stable condition without complications of anesthesia or surgery.  Alexsis Kathman 11/02/2018, 8:57 AM

## 2018-11-03 ENCOUNTER — Ambulatory Visit: Payer: Medicare Other

## 2018-11-03 ENCOUNTER — Encounter: Payer: Self-pay | Admitting: Ophthalmology

## 2018-11-03 ENCOUNTER — Encounter: Payer: Medicare Other | Admitting: Family Medicine

## 2018-11-18 ENCOUNTER — Other Ambulatory Visit: Payer: Self-pay

## 2018-11-18 ENCOUNTER — Other Ambulatory Visit
Admission: RE | Admit: 2018-11-18 | Discharge: 2018-11-18 | Disposition: A | Discharge status: Home / Self Care | Payer: Medicare Other | Source: Ambulatory Visit | Attending: Ophthalmology | Admitting: Ophthalmology

## 2018-11-18 DIAGNOSIS — Z01812 Encounter for preprocedural laboratory examination: Secondary | ICD-10-CM | POA: Insufficient documentation

## 2018-11-18 DIAGNOSIS — Z20828 Contact with and (suspected) exposure to other viral communicable diseases: Secondary | ICD-10-CM | POA: Insufficient documentation

## 2018-11-18 LAB — SARS CORONAVIRUS 2 (TAT 6-24 HRS): SARS Coronavirus 2: NEGATIVE

## 2018-11-18 NOTE — Discharge Instructions (Signed)

## 2018-11-23 ENCOUNTER — Other Ambulatory Visit: Payer: Self-pay

## 2018-11-23 ENCOUNTER — Ambulatory Visit: Payer: Medicare Other | Admitting: Anesthesiology

## 2018-11-23 ENCOUNTER — Encounter
Admission: RE | Disposition: A | Discharge status: Home / Self Care | Payer: Self-pay | Source: Home / Self Care | Attending: Ophthalmology

## 2018-11-23 ENCOUNTER — Ambulatory Visit
Admission: RE | Admit: 2018-11-23 | Discharge: 2018-11-23 | Disposition: A | Discharge status: Home / Self Care | Payer: Medicare Other | Attending: Ophthalmology | Admitting: Ophthalmology

## 2018-11-23 DIAGNOSIS — K219 Gastro-esophageal reflux disease without esophagitis: Secondary | ICD-10-CM | POA: Insufficient documentation

## 2018-11-23 DIAGNOSIS — Z87891 Personal history of nicotine dependence: Secondary | ICD-10-CM | POA: Insufficient documentation

## 2018-11-23 DIAGNOSIS — I499 Cardiac arrhythmia, unspecified: Secondary | ICD-10-CM | POA: Diagnosis not present

## 2018-11-23 DIAGNOSIS — F329 Major depressive disorder, single episode, unspecified: Secondary | ICD-10-CM | POA: Insufficient documentation

## 2018-11-23 DIAGNOSIS — F419 Anxiety disorder, unspecified: Secondary | ICD-10-CM | POA: Diagnosis not present

## 2018-11-23 DIAGNOSIS — H2511 Age-related nuclear cataract, right eye: Secondary | ICD-10-CM | POA: Diagnosis not present

## 2018-11-23 DIAGNOSIS — E7800 Pure hypercholesterolemia, unspecified: Secondary | ICD-10-CM | POA: Insufficient documentation

## 2018-11-23 DIAGNOSIS — Z79899 Other long term (current) drug therapy: Secondary | ICD-10-CM | POA: Diagnosis not present

## 2018-11-23 DIAGNOSIS — I10 Essential (primary) hypertension: Secondary | ICD-10-CM | POA: Insufficient documentation

## 2018-11-23 HISTORY — PX: CATARACT EXTRACTION W/PHACO: SHX586

## 2018-11-23 SURGERY — PHACOEMULSIFICATION, CATARACT, WITH IOL INSERTION
Anesthesia: Monitor Anesthesia Care | Site: Eye | Laterality: Right

## 2018-11-23 MED ORDER — ARMC OPHTHALMIC DILATING DROPS
1.0000 "application " | OPHTHALMIC | Status: DC | PRN
  Administered 2018-11-23 (×3): 1 via OPHTHALMIC

## 2018-11-23 MED ORDER — CEFUROXIME OPHTHALMIC INJECTION 1 MG/0.1 ML
INJECTION | OPHTHALMIC | Status: DC | PRN
  Administered 2018-11-23: 0.1 mL via INTRACAMERAL

## 2018-11-23 MED ORDER — TETRACAINE HCL 0.5 % OP SOLN
1.0000 [drp] | OPHTHALMIC | Status: DC | PRN
  Administered 2018-11-23 (×3): 1 [drp] via OPHTHALMIC

## 2018-11-23 MED ORDER — MIDAZOLAM HCL 2 MG/2ML IJ SOLN
INTRAMUSCULAR | Status: DC | PRN
  Administered 2018-11-23: 2 mg via INTRAVENOUS

## 2018-11-23 MED ORDER — BRIMONIDINE TARTRATE-TIMOLOL 0.2-0.5 % OP SOLN
OPHTHALMIC | Status: DC | PRN
  Administered 2018-11-23: 1 [drp] via OPHTHALMIC

## 2018-11-23 MED ORDER — MOXIFLOXACIN HCL 0.5 % OP SOLN
1.0000 [drp] | OPHTHALMIC | Status: DC | PRN
  Administered 2018-11-23 (×3): 1 [drp] via OPHTHALMIC

## 2018-11-23 MED ORDER — NA HYALUR & NA CHOND-NA HYALUR 0.4-0.35 ML IO KIT
PACK | INTRAOCULAR | Status: DC | PRN
  Administered 2018-11-23: 1 mL via INTRAOCULAR

## 2018-11-23 MED ORDER — ACETAMINOPHEN 160 MG/5ML PO SOLN: 325.0000 mg | ORAL | Status: DC | PRN

## 2018-11-23 MED ORDER — LIDOCAINE HCL (PF) 2 % IJ SOLN
INTRAOCULAR | Status: DC | PRN
  Administered 2018-11-23: 1 mL

## 2018-11-23 MED ORDER — ACETAMINOPHEN 325 MG PO TABS: 325.0000 mg | ORAL_TABLET | ORAL | Status: DC | PRN

## 2018-11-23 MED ORDER — EPINEPHRINE PF 1 MG/ML IJ SOLN
INTRAOCULAR | Status: DC | PRN
  Administered 2018-11-23: 09:00:00 48 mL via OPHTHALMIC

## 2018-11-23 MED ORDER — FENTANYL CITRATE (PF) 100 MCG/2ML IJ SOLN
INTRAMUSCULAR | Status: DC | PRN
  Administered 2018-11-23: 50 ug via INTRAVENOUS

## 2018-11-23 SURGICAL SUPPLY — 16 items
CANNULA ANT/CHMB 27G (MISCELLANEOUS) ×1 IMPLANT
CANNULA ANT/CHMB 27GA (MISCELLANEOUS) ×3 IMPLANT
GLOVE SURG LX 7.5 STRW (GLOVE) ×2
GLOVE SURG LX STRL 7.5 STRW (GLOVE) ×1 IMPLANT
GLOVE SURG TRIUMPH 8.0 PF LTX (GLOVE) ×3 IMPLANT
GOWN STRL REUS W/ TWL LRG LVL3 (GOWN DISPOSABLE) ×2 IMPLANT
GOWN STRL REUS W/TWL LRG LVL3 (GOWN DISPOSABLE) ×4
LENS IOL TECNIS ITEC 22.0 (Intraocular Lens) ×2 IMPLANT
MARKER SKIN DUAL TIP RULER LAB (MISCELLANEOUS) ×3 IMPLANT
PACK CATARACT BRASINGTON (MISCELLANEOUS) ×3 IMPLANT
PACK EYE AFTER SURG (MISCELLANEOUS) ×3 IMPLANT
PACK OPTHALMIC (MISCELLANEOUS) ×3 IMPLANT
SYR 3ML LL SCALE MARK (SYRINGE) ×3 IMPLANT
SYR TB 1ML LUER SLIP (SYRINGE) ×3 IMPLANT
WATER STERILE IRR 500ML POUR (IV SOLUTION) ×3 IMPLANT
WIPE NON LINTING 3.25X3.25 (MISCELLANEOUS) ×3 IMPLANT

## 2018-11-23 NOTE — Transfer of Care (Signed)
Immediate Anesthesia Transfer of Care Note  Patient: Connie Morrison  Procedure(s) Performed: CATARACT EXTRACTION PHACO AND INTRAOCULAR LENS PLACEMENT (IOC) - right  00:50.6  17.1 (Right Eye)  Patient Location: PACU  Anesthesia Type: MAC  Level of Consciousness: awake, alert  and patient cooperative  Airway and Oxygen Therapy: Patient Spontanous Breathing and Patient connected to supplemental oxygen  Post-op Assessment: Post-op Vital signs reviewed, Patient's Cardiovascular Status Stable, Respiratory Function Stable, Patent Airway and No signs of Nausea or vomiting  Post-op Vital Signs: Reviewed and stable  Complications: No apparent anesthesia complications

## 2018-11-23 NOTE — H&P (Signed)

## 2018-11-23 NOTE — Anesthesia Postprocedure Evaluation (Signed)
Anesthesia Post Note  Patient: Connie Morrison  Procedure(s) Performed: CATARACT EXTRACTION PHACO AND INTRAOCULAR LENS PLACEMENT (IOC) - right  00:50.6  17.1 (Right Eye)  Anesthesia Type: MAC    Wanda Plump Christne Platts

## 2018-11-23 NOTE — Anesthesia Preprocedure Evaluation (Signed)
Anesthesia Evaluation   Patient awake    Reviewed: Allergy & Precautions, NPO status , Patient's Chart, lab work & pertinent test results  Airway Mallampati: II  TM Distance: >3 FB Neck ROM: Full    Dental   Pulmonary former smoker,    Pulmonary exam normal        Cardiovascular hypertension, Normal cardiovascular exam     Neuro/Psych Anxiety Depression    GI/Hepatic GERD  Controlled,  Endo/Other    Renal/GU      Musculoskeletal   Abdominal   Peds  Hematology   Anesthesia Other Findings   Reproductive/Obstetrics                             Anesthesia Physical Anesthesia Plan  ASA: II  Anesthesia Plan: MAC   Post-op Pain Management:    Induction:   PONV Risk Score and Plan: 2  Airway Management Planned: Nasal Cannula  Additional Equipment:   Intra-op Plan:   Post-operative Plan:   Informed Consent: I have reviewed the patients History and Physical, chart, labs and discussed the procedure including the risks, benefits and alternatives for the proposed anesthesia with the patient or authorized representative who has indicated his/her understanding and acceptance.       Plan Discussed with: CRNA, Anesthesiologist and Surgeon  Anesthesia Plan Comments:         Anesthesia Quick Evaluation

## 2018-11-23 NOTE — Op Note (Signed)
LOCATION:  [place]   PREOPERATIVE DIAGNOSIS:    Nuclear sclerotic cataract right eye. H25.11   POSTOPERATIVE DIAGNOSIS:  Nuclear sclerotic cataract right eye.     PROCEDURE:  Phacoemusification with posterior chamber intraocular lens placement of the right eye   LENS:   Implant Name Type Inv. Item Serial No. Manufacturer Lot No. LRB No. Used Action  LENS IOL DIOP 22.0 - [id number] Intraocular Lens LENS IOL DIOP 22.0 [id number]  Right 1 Implanted        ULTRASOUND TIME: 17 % of 0 minutes, 51 seconds.  CDE 8.7   SURGEON:  Wyonia Hough, MD   ANESTHESIA:  Topical with tetracaine drops and 2% Xylocaine jelly, augmented with 1% preservative-free intracameral lidocaine.    COMPLICATIONS:  None.   DESCRIPTION OF PROCEDURE:  The patient was identified in the holding room and transported to the operating room and placed in the supine position under the operating microscope.  The right eye was identified as the operative eye and it was prepped and draped in the usual sterile ophthalmic fashion.   A 1 millimeter clear-corneal paracentesis was made at the 12:00 position.  0.5 ml of preservative-free 1% lidocaine was injected into the anterior chamber. The anterior chamber was filled with Viscoat viscoelastic.  A 2.4 millimeter keratome was used to make a near-clear corneal incision at the 9:00 position.  A curvilinear capsulorrhexis was made with a cystotome and capsulorrhexis forceps.  Balanced salt solution was used to hydrodissect and hydrodelineate the nucleus.   Phacoemulsification was then used in stop and chop fashion to remove the lens nucleus and epinucleus.  The remaining cortex was then removed using the irrigation and aspiration handpiece. Provisc was then placed into the capsular bag to distend it for lens placement.  A lens was then injected into the capsular bag.  The remaining viscoelastic was aspirated.   Wounds were hydrated with balanced salt solution.   The anterior chamber was inflated to a physiologic pressure with balanced salt solution.  No wound leaks were noted. Cefuroxime 0.1 ml of a 10mg /ml solution was injected into the anterior chamber for a dose of 1 mg of intracameral antibiotic at the completion of the case.   Timolol and Brimonidine drops were applied to the eye.  The patient was taken to the recovery room in stable condition without complications of anesthesia or surgery.   Arneta Mahmood 11/23/2018, 9:31 AM

## 2018-11-23 NOTE — Anesthesia Procedure Notes (Signed)
Procedure Name: MAC Performed by: Makayli Bracken, CRNA Pre-anesthesia Checklist: Patient identified, Emergency Drugs available, Suction available, Timeout performed and Patient being monitored Patient Re-evaluated:Patient Re-evaluated prior to induction Oxygen Delivery Method: Nasal cannula Placement Confirmation: positive ETCO2       

## 2018-11-24 ENCOUNTER — Encounter: Payer: Self-pay | Admitting: Ophthalmology

## 2018-12-01 ENCOUNTER — Encounter: Payer: Self-pay | Admitting: Family Medicine

## 2018-12-01 ENCOUNTER — Other Ambulatory Visit: Payer: Self-pay

## 2018-12-01 ENCOUNTER — Ambulatory Visit (INDEPENDENT_AMBULATORY_CARE_PROVIDER_SITE_OTHER): Payer: Medicare Other | Admitting: Family Medicine

## 2018-12-01 ENCOUNTER — Telehealth: Payer: Self-pay

## 2018-12-01 ENCOUNTER — Other Ambulatory Visit: Payer: Self-pay | Admitting: Family Medicine

## 2018-12-01 VITALS — BP 128/77 | HR 56 | Temp 97.9°F | Ht 66.38 in | Wt 137.1 lb

## 2018-12-01 DIAGNOSIS — M8580 Other specified disorders of bone density and structure, unspecified site: Secondary | ICD-10-CM

## 2018-12-01 DIAGNOSIS — F325 Major depressive disorder, single episode, in full remission: Secondary | ICD-10-CM

## 2018-12-01 DIAGNOSIS — I10 Essential (primary) hypertension: Secondary | ICD-10-CM

## 2018-12-01 DIAGNOSIS — E7800 Pure hypercholesterolemia, unspecified: Secondary | ICD-10-CM

## 2018-12-01 DIAGNOSIS — J3089 Other allergic rhinitis: Secondary | ICD-10-CM

## 2018-12-01 DIAGNOSIS — F5101 Primary insomnia: Secondary | ICD-10-CM

## 2018-12-01 DIAGNOSIS — K219 Gastro-esophageal reflux disease without esophagitis: Secondary | ICD-10-CM

## 2018-12-01 DIAGNOSIS — R3129 Other microscopic hematuria: Secondary | ICD-10-CM

## 2018-12-01 DIAGNOSIS — Z23 Encounter for immunization: Secondary | ICD-10-CM | POA: Diagnosis not present

## 2018-12-01 DIAGNOSIS — Z0000 Encounter for general adult medical examination without abnormal findings: Secondary | ICD-10-CM | POA: Diagnosis not present

## 2018-12-01 LAB — HM MAMMOGRAPHY

## 2018-12-01 MED ORDER — FLUOXETINE HCL 10 MG PO CAPS: 30.0000 mg | ORAL_CAPSULE | Freq: Every day | ORAL | 1 refills | Status: DC

## 2018-12-01 MED ORDER — OMEPRAZOLE 20 MG PO CPDR: 20.0000 mg | DELAYED_RELEASE_CAPSULE | Freq: Every day | ORAL | 1 refills | Status: DC

## 2018-12-01 MED ORDER — SIMVASTATIN 20 MG PO TABS: 20.0000 mg | ORAL_TABLET | Freq: Every day | ORAL | 3 refills | Status: DC

## 2018-12-01 MED ORDER — HYDROCHLOROTHIAZIDE 25 MG PO TABS: 25.0000 mg | ORAL_TABLET | Freq: Every day | ORAL | 1 refills | Status: DC | PRN

## 2018-12-01 NOTE — Assessment & Plan Note (Signed)
BPs stable and WNL, continue current regimen 

## 2018-12-01 NOTE — Telephone Encounter (Signed)
Letter written and on her chart.

## 2018-12-01 NOTE — Telephone Encounter (Signed)
Patient notified

## 2018-12-01 NOTE — Progress Notes (Signed)
BP 128/77   Pulse (!) 56   Temp 97.9 F (36.6 C)   Ht 5' 6.38" (1.686 m)   Wt 137 lb 2 oz (62.2 kg)   LMP 06/30/1991 (Approximate)   SpO2 99%   BMI 21.88 kg/m    Subjective:    Patient ID: Connie Morrison, female    DOB: 1945-10-19, 73 y.o.   MRN: [id number]  HPI: Connie Morrison is a 73 y.o. female presenting on 12/01/2018 for comprehensive medical examination. Current medical complaints include:none  HTN - Taking her medication faithfully without issue. Denies CP, SOB, HAs, dizziness. Home BPs when checked 120s/70s.   HLD - on simvastatin without issue. No CP, SOB, claudication.   Insomnia - Takes 1/2 a tylenol PM to sleep which works well for her. No concerns, tries to practice good sleep hygiene  GERD - on prilosec without breakthrough sxs.   Depression - Stable on prozac, no SI/HI, crying spells, anhedonia, or other issues.   She currently lives with: Menopausal Symptoms: no  Depression Screen done today and results listed below:  Depression screen [facility] 2/9 12/01/2018 08/17/2017 01/11/2017 07/24/2016 07/19/2015  Decreased Interest 0 0 0 0 0  Down, Depressed, Hopeless 0 0 0 0 0  PHQ - 2 Score 0 0 0 0 0  Altered sleeping 1 1 0 1 -  Tired, decreased energy 0 0 0 1 -  Change in appetite 0 1 0 1 -  Feeling bad or failure about yourself  0 0 0 0 -  Trouble concentrating 0 1 0 0 -  Moving slowly or fidgety/restless 0 0 0 0 -  Suicidal thoughts 0 0 0 0 -  PHQ-9 Score 1 3 0 3 -  Difficult doing work/chores Not difficult at all - - - -    The patient does not have a history of falls. I did not complete a risk assessment for falls. A plan of care for falls was documented.   Past Medical History:  Past Medical History:  Diagnosis Date  . Allergic rhinitis   . Anginal pain ([place])   . Anxiety   . Complication of anesthesia    itching after anesthesia (50 yrs ago)  . Depression   . Dyspnea    (pt denies)  . GERD (gastroesophageal reflux disease)   . Hematuria   .  Hyperlipidemia   . Hypertension    (pt denies)  . Menopause   . Microscopic hematuria   . Vaginal atrophy   . Wears dentures    lower partial    Surgical History:  Past Surgical History:  Procedure Laterality Date  . BREAST SURGERY     cyst removed  . CATARACT EXTRACTION W/PHACO Left 11/02/2018   Procedure: CATARACT EXTRACTION PHACO AND INTRAOCULAR LENS PLACEMENT ([place])  LEFT;  Surgeon: Leandrew Koyanagi, MD;  Location: [place];  Service: Ophthalmology;  Laterality: Left;  . CATARACT EXTRACTION W/PHACO Right 11/23/2018   Procedure: CATARACT EXTRACTION PHACO AND INTRAOCULAR LENS PLACEMENT ([place]) - right  00:50.6  17.1;  Surgeon: Leandrew Koyanagi, MD;  Location: [place];  Service: Ophthalmology;  Laterality: Right;  . COLONOSCOPY    . COLONOSCOPY WITH PROPOFOL N/A 11/02/2017   Procedure: COLONOSCOPY WITH PROPOFOL;  Surgeon: Lollie Sails, MD;  Location: [facility] ENDOSCOPY;  Service: Endoscopy;  Laterality: N/A;  . TONSILLECTOMY    . TUMOR EXCISION Right    kidney    Medications:  Current Outpatient Medications on File Prior to Visit  Medication Sig  .  diphenhydramine-acetaminophen (TYLENOL PM) 25-500 MG TABS tablet Take 0.5 tablets by mouth at bedtime as needed.  . Multiple Vitamin (MULTIVITAMIN) tablet Take 1 tablet by mouth daily.  Marland Kitchen tretinoin (RETIN-A) 0.1 % cream APPLY PEA SIZE AMOUNT TO ENTIRE DRY FACE NIGHTLY   No current facility-administered medications on file prior to visit.     Allergies:  Allergies  Allergen Reactions  . Cymbalta [Duloxetine Hcl] Other (See Comments)    constipation  . Sulfa Antibiotics Rash    Social History:  Social History   Socioeconomic History  . Marital status: Married    Spouse name: Not on file  . Number of children: Not on file  . Years of education: Not on file  . Highest education level: Not on file  Occupational History  . Not on file  Social Needs  . Financial resource strain: Not on file  .  Food insecurity    Worry: Not on file    Inability: Not on file  . Transportation needs    Medical: Not on file    Non-medical: Not on file  Tobacco Use  . Smoking status: Former Smoker    Types: Cigarettes    Quit date: 1980    Years since quitting: 40.7  . Smokeless tobacco: Never Used  Substance and Sexual Activity  . Alcohol use: No    Alcohol/week: 0.0 standard drinks  . Drug use: No  . Sexual activity: Yes  Lifestyle  . Physical activity    Days per week: Not on file    Minutes per session: Not on file  . Stress: Not on file  Relationships  . Social Herbalist on phone: Not on file    Gets together: Not on file    Attends religious service: Not on file    Active member of club or organization: Not on file    Attends meetings of clubs or organizations: Not on file    Relationship status: Not on file  . Intimate partner violence    Fear of current or ex partner: Not on file    Emotionally abused: Not on file    Physically abused: Not on file    Forced sexual activity: Not on file  Other Topics Concern  . Not on file  Social History Narrative  . Not on file   Social History   Tobacco Use  Smoking Status Former Smoker  . Types: Cigarettes  . Quit date: 68  . Years since quitting: 40.7  Smokeless Tobacco Never Used   Social History   Substance and Sexual Activity  Alcohol Use No  . Alcohol/week: 0.0 standard drinks    Family History:  Family History  Problem Relation Age of Onset  . Heart disease Father   . Cancer Sister        lung  . Cancer Brother        colon  . Diabetes Brother   . Stroke Maternal Grandmother   . Cancer Maternal Grandfather        lung  . Cancer Sister        lung  . COPD Sister   . Heart disease Brother     Past medical history, surgical history, medications, allergies, family history and social history reviewed with patient today and changes made to appropriate areas of the chart.   Review of Systems -  General ROS: negative Psychological ROS: negative Ophthalmic ROS: negative ENT ROS: negative Allergy and Immunology ROS: negative Hematological and Lymphatic  ROS: negative Endocrine ROS: negative Breast ROS: negative for breast lumps Respiratory ROS: no cough, shortness of breath, or wheezing Cardiovascular ROS: no chest pain or dyspnea on exertion Gastrointestinal ROS: no abdominal pain, change in bowel habits, or black or bloody stools Genito-Urinary ROS: no dysuria, trouble voiding, or hematuria Musculoskeletal ROS: negative Neurological ROS: no TIA or stroke symptoms Dermatological ROS: negative All other ROS negative except what is listed above and in the HPI.      Objective:    BP 128/77   Pulse (!) 56   Temp 97.9 F (36.6 C)   Ht 5' 6.38" (1.686 m)   Wt 137 lb 2 oz (62.2 kg)   LMP 06/30/1991 (Approximate)   SpO2 99%   BMI 21.88 kg/m   Wt Readings from Last 3 Encounters:  12/01/18 137 lb 2 oz (62.2 kg)  11/23/18 138 lb (62.6 kg)  11/02/18 138 lb (62.6 kg)    Physical Exam Vitals signs and nursing note reviewed.  Constitutional:      General: She is not in acute distress.    Appearance: She is well-developed.  HENT:     Head: Atraumatic.     Right Ear: External ear normal.     Left Ear: External ear normal.     Nose: Nose normal.     Mouth/Throat:     Pharynx: No oropharyngeal exudate.  Eyes:     General: No scleral icterus.    Conjunctiva/sclera: Conjunctivae normal.     Pupils: Pupils are equal, round, and reactive to light.  Neck:     Musculoskeletal: Normal range of motion and neck supple.     Thyroid: No thyromegaly.  Cardiovascular:     Rate and Rhythm: Normal rate and regular rhythm.     Heart sounds: Normal heart sounds.  Pulmonary:     Effort: Pulmonary effort is normal. No respiratory distress.     Breath sounds: Normal breath sounds.  Chest:     Breasts:        Right: No mass, skin change or tenderness.        Left: No mass, skin change  or tenderness.  Abdominal:     General: Bowel sounds are normal.     Palpations: Abdomen is soft. There is no mass.     Tenderness: There is no abdominal tenderness.  Genitourinary:    Comments: GU exam declined  Musculoskeletal: Normal range of motion.        General: No tenderness.  Lymphadenopathy:     Cervical: No cervical adenopathy.     Upper Body:     Right upper body: No axillary adenopathy.     Left upper body: No axillary adenopathy.  Skin:    General: Skin is warm and dry.     Findings: No rash.  Neurological:     Mental Status: She is alert and oriented to person, place, and time.     Cranial Nerves: No cranial nerve deficit.  Psychiatric:        Behavior: Behavior normal.    Results for orders placed or performed in visit on 12/01/18  Microscopic Examination   URINE  Result Value Ref Range   WBC, UA 6-10 (A) 0 - 5 /hpf   RBC 3-10 (A) 0 - 2 /hpf   Epithelial Cells (non renal) 0-10 0 - 10 /hpf   Casts Present None seen /lpf   Cast Type Granular casts (A) N/A   Mucus, UA Present Not Estab.   Bacteria, UA  Few (A) None seen/Few  Urine Culture, Reflex   URINE  Result Value Ref Range   Urine Culture, Routine Final report    Organism ID, Bacteria Comment   HM MAMMOGRAPHY  Result Value Ref Range   HM Mammogram 0-4 Bi-Rad 0-4 Bi-Rad, Self Reported Normal  CBC with Differential/Platelet  Result Value Ref Range   WBC 4.0 3.4 - 10.8 x10E3/uL   RBC 4.61 3.77 - 5.28 x10E6/uL   Hemoglobin 13.9 11.1 - 15.9 g/dL   Hematocrit 41.8 34.0 - 46.6 %   MCV 91 79 - 97 fL   MCH 30.2 26.6 - 33.0 pg   MCHC 33.3 31.5 - 35.7 g/dL   RDW 13.0 11.7 - 15.4 %   Platelets 227 150 - 450 x10E3/uL   Neutrophils 47 Not Estab. %   Lymphs 40 Not Estab. %   Monocytes 10 Not Estab. %   Eos 2 Not Estab. %   Basos 1 Not Estab. %   Neutrophils Absolute 1.9 1.4 - 7.0 x10E3/uL   Lymphocytes Absolute 1.6 0.7 - 3.1 x10E3/uL   Monocytes Absolute 0.4 0.1 - 0.9 x10E3/uL   EOS (ABSOLUTE) 0.1 0.0  - 0.4 x10E3/uL   Basophils Absolute 0.0 0.0 - 0.2 x10E3/uL   Immature Granulocytes 0 Not Estab. %   Immature Grans (Abs) 0.0 0.0 - 0.1 x10E3/uL  Comprehensive metabolic panel  Result Value Ref Range   Glucose 91 65 - 99 mg/dL   BUN 18 8 - 27 mg/dL   Creatinine, Ser 0.86 0.57 - 1.00 mg/dL   GFR calc non Af Amer 68 >59 mL/min/1.73   GFR calc Af Amer 78 >59 mL/min/1.73   BUN/Creatinine Ratio 21 12 - 28   Sodium 144 134 - 144 mmol/L   Potassium 4.0 3.5 - 5.2 mmol/L   Chloride 104 96 - 106 mmol/L   CO2 27 20 - 29 mmol/L   Calcium 9.3 8.7 - 10.3 mg/dL   Total Protein 6.8 6.0 - 8.5 g/dL   Albumin 4.5 3.7 - 4.7 g/dL   Globulin, Total 2.3 1.5 - 4.5 g/dL   Albumin/Globulin Ratio 2.0 1.2 - 2.2   Bilirubin Total 0.5 0.0 - 1.2 mg/dL   Alkaline Phosphatase 74 39 - 117 IU/L   AST 20 0 - 40 IU/L   ALT 13 0 - 32 IU/L  Lipid Panel w/o Chol/HDL Ratio  Result Value Ref Range   Cholesterol, Total 201 (H) 100 - 199 mg/dL   Triglycerides 131 0 - 149 mg/dL   HDL 62 >39 mg/dL   VLDL Cholesterol Cal 23 5 - 40 mg/dL   LDL Chol Calc (NIH) 116 (H) 0 - 99 mg/dL  Microalbumin, Urine Waived  Result Value Ref Range   Microalb, Ur Waived 80 (H) 0 - 19 mg/L   Creatinine, Urine Waived 200 10 - 300 mg/dL   Microalb/Creat Ratio 30-300 (H) <30 mg/g  TSH  Result Value Ref Range   TSH 2.100 0.450 - 4.500 uIU/mL  UA/M w/rflx Culture, Routine   Specimen: Urine   URINE  Result Value Ref Range   Specific Gravity, UA 1.020 1.005 - 1.030   pH, UA 5.5 5.0 - 7.5   Color, UA Yellow Yellow   Appearance Ur Clear Clear   Leukocytes,UA 1+ (A) Negative   Protein,UA 1+ (A) Negative/Trace   Glucose, UA Negative Negative   Ketones, UA Trace (A) Negative   RBC, UA 3+ (A) Negative   Bilirubin, UA Negative Negative   Urobilinogen, Ur 0.2 0.2 -  1.0 mg/dL   Nitrite, UA Negative Negative   Microscopic Examination See below:    Urinalysis Reflex Comment       Assessment & Plan:   Problem List Items Addressed This Visit       Cardiovascular and Mediastinum   Hypertension - Primary    BPs stable and WNL, continue current regimen      Relevant Medications   simvastatin (ZOCOR) 20 MG tablet   hydrochlorothiazide (HYDRODIURIL) 25 MG tablet   Other Relevant Orders   CBC with Differential/Platelet (Completed)   Comprehensive metabolic panel (Completed)   Microalbumin, Urine Waived (Completed)   TSH (Completed)     Respiratory   Allergic rhinitis    Stable and under good control, continue current regimen        Digestive   GERD (gastroesophageal reflux disease)    Stable and well controlled on prilosec regimen. Continue current regimen      Relevant Medications   omeprazole (PRILOSEC) 20 MG capsule     Musculoskeletal and Integument   Osteopenia    On vit d and calcium supplements and exercises regularly. Continue current regimen        Genitourinary   Microscopic hematuria   Relevant Orders   CBC with Differential/Platelet (Completed)   Comprehensive metabolic panel (Completed)   TSH (Completed)   UA/M w/rflx Culture, Routine (Completed)     Other   Insomnia    Takes tylenol PM prn which works well for her. Continue current regimen and practicing good sleep hygiene      Hyperlipidemia    Recheck lipids, adjust as needed. Continue current regimen.       Relevant Medications   simvastatin (ZOCOR) 20 MG tablet   hydrochlorothiazide (HYDRODIURIL) 25 MG tablet   Other Relevant Orders   CBC with Differential/Platelet (Completed)   Comprehensive metabolic panel (Completed)   Lipid Panel w/o Chol/HDL Ratio (Completed)   TSH (Completed)   Depression, major, single episode, complete remission (HCC)    Stable and under good control on prozac regimen. Continue current regimen      Relevant Medications   FLUoxetine (PROZAC) 10 MG capsule   Other Relevant Orders   CBC with Differential/Platelet (Completed)   Comprehensive metabolic panel (Completed)   TSH (Completed)    Other Visit  Diagnoses    Annual physical exam       Immunization due       Relevant Orders   Flu Vaccine QUAD High Dose(Fluad) (Completed)       Follow up plan: Return in about 6 months (around 05/31/2019) for 6 month f/u.   LABORATORY TESTING:  - Pap smear: not applicable  IMMUNIZATIONS:   - Tdap: Tetanus vaccination status reviewed: postponed. - Influenza: Administered today - Pneumovax: Up to date - Prevnar: Up to date - HPV: Not applicable - Zostavax vaccine: Up to date  SCREENING: -Mammogram: scheduled for later today  - Colonoscopy: Up to date  - Bone Density: Up to date   PATIENT COUNSELING:   Advised to take 1 mg of folate supplement per day if capable of pregnancy.   Sexuality: Discussed sexually transmitted diseases, partner selection, use of condoms, avoidance of unintended pregnancy  and contraceptive alternatives.   Advised to avoid cigarette smoking.  I discussed with the patient that most Morrison either abstain from alcohol or drink within safe limits (<=14/week and <=4 drinks/occasion for males, <=7/weeks and <= 3 drinks/occasion for females) and that the risk for alcohol disorders and other health effects rises  proportionally with the number of drinks per week and how often a drinker exceeds daily limits.  Discussed cessation/primary prevention of drug use and availability of treatment for abuse.   Diet: Encouraged to adjust caloric intake to maintain  or achieve ideal body weight, to reduce intake of dietary saturated fat and total fat, to limit sodium intake by avoiding high sodium foods and not adding table salt, and to maintain adequate dietary potassium and calcium preferably from fresh fruits, vegetables, and low-fat dairy products.    stressed the importance of regular exercise  Injury prevention: Discussed safety belts, safety helmets, smoke detector, smoking near bedding or upholstery.   Dental health: Discussed importance of regular tooth brushing, flossing,  and dental visits.    NEXT PREVENTATIVE PHYSICAL DUE IN 1 YEAR. Return in about 6 months (around 05/31/2019) for 6 month f/u.

## 2018-12-01 NOTE — Telephone Encounter (Signed)
Copied from [place] [id number]. Topic: General - Other >> Dec 01, 2018  2:37 PM Erick Blinks wrote: Reason for CRM: Pt called in requesting note to have authorization to exercise in gym. She wants it to state that "Doctor's requests that pt exercises"  Best contact: [phone]

## 2018-12-02 LAB — CBC WITH DIFFERENTIAL/PLATELET
Basophils Absolute: 0 10*3/uL (ref 0.0–0.2)
Basos: 1 %
EOS (ABSOLUTE): 0.1 10*3/uL (ref 0.0–0.4)
Eos: 2 %
Hematocrit: 41.8 % (ref 34.0–46.6)
Hemoglobin: 13.9 g/dL (ref 11.1–15.9)
Immature Grans (Abs): 0 10*3/uL (ref 0.0–0.1)
Immature Granulocytes: 0 %
Lymphocytes Absolute: 1.6 10*3/uL (ref 0.7–3.1)
Lymphs: 40 %
MCH: 30.2 pg (ref 26.6–33.0)
MCHC: 33.3 g/dL (ref 31.5–35.7)
MCV: 91 fL (ref 79–97)
Monocytes Absolute: 0.4 10*3/uL (ref 0.1–0.9)
Monocytes: 10 %
Neutrophils Absolute: 1.9 10*3/uL (ref 1.4–7.0)
Neutrophils: 47 %
Platelets: 227 10*3/uL (ref 150–450)
RBC: 4.61 x10E6/uL (ref 3.77–5.28)
RDW: 13 % (ref 11.7–15.4)
WBC: 4 10*3/uL (ref 3.4–10.8)

## 2018-12-02 LAB — COMPREHENSIVE METABOLIC PANEL
ALT: 13 IU/L (ref 0–32)
AST: 20 IU/L (ref 0–40)
Albumin/Globulin Ratio: 2 (ref 1.2–2.2)
Albumin: 4.5 g/dL (ref 3.7–4.7)
Alkaline Phosphatase: 74 IU/L (ref 39–117)
BUN/Creatinine Ratio: 21 (ref 12–28)
BUN: 18 mg/dL (ref 8–27)
Bilirubin Total: 0.5 mg/dL (ref 0.0–1.2)
CO2: 27 mmol/L (ref 20–29)
Calcium: 9.3 mg/dL (ref 8.7–10.3)
Chloride: 104 mmol/L (ref 96–106)
Creatinine, Ser: 0.86 mg/dL (ref 0.57–1.00)
GFR calc Af Amer: 78 mL/min/{1.73_m2} (ref >59)
GFR calc non Af Amer: 68 mL/min/{1.73_m2} (ref >59)
Globulin, Total: 2.3 g/dL (ref 1.5–4.5)
Glucose: 91 mg/dL (ref 65–99)
Potassium: 4 mmol/L (ref 3.5–5.2)
Sodium: 144 mmol/L (ref 134–144)
Total Protein: 6.8 g/dL (ref 6.0–8.5)

## 2018-12-02 LAB — LIPID PANEL W/O CHOL/HDL RATIO
Cholesterol, Total: 201 mg/dL — ABNORMAL HIGH (ref 100–199)
HDL: 62 mg/dL (ref >39)
LDL Chol Calc (NIH): 116 mg/dL — ABNORMAL HIGH (ref 0–99)
Triglycerides: 131 mg/dL (ref 0–149)
VLDL Cholesterol Cal: 23 mg/dL (ref 5–40)

## 2018-12-02 LAB — TSH: TSH: 2.1 u[IU]/mL (ref 0.450–4.500)

## 2018-12-03 LAB — MICROALBUMIN, URINE WAIVED
Creatinine, Urine Waived: 200 mg/dL (ref 10–300)
Microalb, Ur Waived: 80 mg/L — ABNORMAL HIGH (ref 0–19)

## 2018-12-03 LAB — MICROSCOPIC EXAMINATION

## 2018-12-03 LAB — UA/M W/RFLX CULTURE, ROUTINE
Bilirubin, UA: NEGATIVE
Glucose, UA: NEGATIVE
Nitrite, UA: NEGATIVE
Specific Gravity, UA: 1.02 (ref 1.005–1.030)
Urobilinogen, Ur: 0.2 mg/dL (ref 0.2–1.0)
pH, UA: 5.5 (ref 5.0–7.5)

## 2018-12-03 LAB — URINE CULTURE, REFLEX

## 2018-12-06 NOTE — Assessment & Plan Note (Signed)
Stable and well controlled on prilosec regimen. Continue current regimen

## 2018-12-06 NOTE — Assessment & Plan Note (Signed)
Takes tylenol PM prn which works well for her. Continue current regimen and practicing good sleep hygiene

## 2018-12-06 NOTE — Assessment & Plan Note (Signed)
On vit d and calcium supplements and exercises regularly. Continue current regimen

## 2018-12-06 NOTE — Assessment & Plan Note (Signed)
Stable and under good control on prozac regimen. Continue current regimen

## 2018-12-06 NOTE — Assessment & Plan Note (Signed)
Recheck lipids, adjust as needed. Continue current regimen 

## 2018-12-06 NOTE — Assessment & Plan Note (Signed)
Stable and under good control, continue current regimen 

## 2018-12-21 ENCOUNTER — Other Ambulatory Visit: Payer: Self-pay

## 2018-12-21 ENCOUNTER — Ambulatory Visit: Payer: Medicare Other | Admitting: Family Medicine

## 2018-12-21 ENCOUNTER — Encounter: Payer: Self-pay | Admitting: Family Medicine

## 2018-12-21 VITALS — BP 96/61 | HR 58 | Temp 98.1°F | Ht 67.0 in | Wt 137.0 lb

## 2018-12-21 DIAGNOSIS — R3129 Other microscopic hematuria: Secondary | ICD-10-CM | POA: Diagnosis not present

## 2018-12-21 DIAGNOSIS — R809 Proteinuria, unspecified: Secondary | ICD-10-CM | POA: Diagnosis not present

## 2018-12-21 NOTE — Assessment & Plan Note (Signed)
Chronic, stable. Discussed continued excellent fluid intake, and to call with any new sxs. Cr remains stable and WNL

## 2018-12-21 NOTE — Progress Notes (Signed)
BP 96/61 (BP Location: Right Arm, Patient Position: Sitting, Cuff Size: Normal)   Pulse (!) 58   Temp 98.1 F (36.7 C) (Oral)   Ht 5\' 7"  (1.702 m)   Wt 137 lb (62.1 kg)   LMP 06/30/1991 (Approximate)   SpO2 99%   BMI 21.46 kg/m    Subjective:    Patient ID: Connie Morrison, female    DOB: April 21, 1945, 73 y.o.   MRN: [id number]  HPI: Connie Morrison is a 73 y.o. female  Chief Complaint  Patient presents with  . Results    Patient wants to go over blood work from previous visit.    Patient here today to review some questions she had regarding her recent lab work. Wanting to discuss her RBC count as in one area it shows WNL and beside it shows abnormal. Asymptomatic, and has not historically had issues with her blood counts. Also concerned about her urine results with amicroscopic findings of bacteria, protein, and some blood cells. No symptoms, stays active and hydrated. States she only takes the HCTZ about 1/month as needed.   Relevant past medical, surgical, family and social history reviewed and updated as indicated. Interim medical history since our last visit reviewed. Allergies and medications reviewed and updated.  Review of Systems  Per HPI unless specifically indicated above     Objective:    BP 96/61 (BP Location: Right Arm, Patient Position: Sitting, Cuff Size: Normal)   Pulse (!) 58   Temp 98.1 F (36.7 C) (Oral)   Ht 5\' 7"  (1.702 m)   Wt 137 lb (62.1 kg)   LMP 06/30/1991 (Approximate)   SpO2 99%   BMI 21.46 kg/m   Wt Readings from Last 3 Encounters:  12/21/18 137 lb (62.1 kg)  12/01/18 137 lb 2 oz (62.2 kg)  11/23/18 138 lb (62.6 kg)    Physical Exam Vitals signs and nursing note reviewed.  Constitutional:      Appearance: Normal appearance. She is not ill-appearing.  HENT:     Head: Atraumatic.  Eyes:     Extraocular Movements: Extraocular movements intact.     Conjunctiva/sclera: Conjunctivae normal.  Neck:     Musculoskeletal: Normal range of  motion and neck supple.  Cardiovascular:     Rate and Rhythm: Normal rate and regular rhythm.     Heart sounds: Normal heart sounds.  Pulmonary:     Effort: Pulmonary effort is normal.     Breath sounds: Normal breath sounds.  Musculoskeletal: Normal range of motion.  Skin:    General: Skin is warm and dry.  Neurological:     Mental Status: She is alert and oriented to person, place, and time.  Psychiatric:        Mood and Affect: Mood normal.        Thought Content: Thought content normal.        Judgment: Judgment normal.     Results for orders placed or performed in visit on 12/01/18  Microscopic Examination   URINE  Result Value Ref Range   WBC, UA 6-10 (A) 0 - 5 /hpf   RBC 3-10 (A) 0 - 2 /hpf   Epithelial Cells (non renal) 0-10 0 - 10 /hpf   Casts Present None seen /lpf   Cast Type Granular casts (A) N/A   Mucus, UA Present Not Estab.   Bacteria, UA Few (A) None seen/Few  Urine Culture, Reflex   URINE  Result Value Ref Range   Urine Culture, Routine  Final report    Organism ID, Bacteria Comment   HM MAMMOGRAPHY  Result Value Ref Range   HM Mammogram 0-4 Bi-Rad 0-4 Bi-Rad, Self Reported Normal  CBC with Differential/Platelet  Result Value Ref Range   WBC 4.0 3.4 - 10.8 x10E3/uL   RBC 4.61 3.77 - 5.28 x10E6/uL   Hemoglobin 13.9 11.1 - 15.9 g/dL   Hematocrit 41.8 34.0 - 46.6 %   MCV 91 79 - 97 fL   MCH 30.2 26.6 - 33.0 pg   MCHC 33.3 31.5 - 35.7 g/dL   RDW 13.0 11.7 - 15.4 %   Platelets 227 150 - 450 x10E3/uL   Neutrophils 47 Not Estab. %   Lymphs 40 Not Estab. %   Monocytes 10 Not Estab. %   Eos 2 Not Estab. %   Basos 1 Not Estab. %   Neutrophils Absolute 1.9 1.4 - 7.0 x10E3/uL   Lymphocytes Absolute 1.6 0.7 - 3.1 x10E3/uL   Monocytes Absolute 0.4 0.1 - 0.9 x10E3/uL   EOS (ABSOLUTE) 0.1 0.0 - 0.4 x10E3/uL   Basophils Absolute 0.0 0.0 - 0.2 x10E3/uL   Immature Granulocytes 0 Not Estab. %   Immature Grans (Abs) 0.0 0.0 - 0.1 x10E3/uL  Comprehensive  metabolic panel  Result Value Ref Range   Glucose 91 65 - 99 mg/dL   BUN 18 8 - 27 mg/dL   Creatinine, Ser 0.86 0.57 - 1.00 mg/dL   GFR calc non Af Amer 68 >59 mL/min/1.73   GFR calc Af Amer 78 >59 mL/min/1.73   BUN/Creatinine Ratio 21 12 - 28   Sodium 144 134 - 144 mmol/L   Potassium 4.0 3.5 - 5.2 mmol/L   Chloride 104 96 - 106 mmol/L   CO2 27 20 - 29 mmol/L   Calcium 9.3 8.7 - 10.3 mg/dL   Total Protein 6.8 6.0 - 8.5 g/dL   Albumin 4.5 3.7 - 4.7 g/dL   Globulin, Total 2.3 1.5 - 4.5 g/dL   Albumin/Globulin Ratio 2.0 1.2 - 2.2   Bilirubin Total 0.5 0.0 - 1.2 mg/dL   Alkaline Phosphatase 74 39 - 117 IU/L   AST 20 0 - 40 IU/L   ALT 13 0 - 32 IU/L  Lipid Panel w/o Chol/HDL Ratio  Result Value Ref Range   Cholesterol, Total 201 (H) 100 - 199 mg/dL   Triglycerides 131 0 - 149 mg/dL   HDL 62 >39 mg/dL   VLDL Cholesterol Cal 23 5 - 40 mg/dL   LDL Chol Calc (NIH) 116 (H) 0 - 99 mg/dL  Microalbumin, Urine Waived  Result Value Ref Range   Microalb, Ur Waived 80 (H) 0 - 19 mg/L   Creatinine, Urine Waived 200 10 - 300 mg/dL   Microalb/Creat Ratio 30-300 (H) <30 mg/g  TSH  Result Value Ref Range   TSH 2.100 0.450 - 4.500 uIU/mL  UA/M w/rflx Culture, Routine   Specimen: Urine   URINE  Result Value Ref Range   Specific Gravity, UA 1.020 1.005 - 1.030   pH, UA 5.5 5.0 - 7.5   Color, UA Yellow Yellow   Appearance Ur Clear Clear   Leukocytes,UA 1+ (A) Negative   Protein,UA 1+ (A) Negative/Trace   Glucose, UA Negative Negative   Ketones, UA Trace (A) Negative   RBC, UA 3+ (A) Negative   Bilirubin, UA Negative Negative   Urobilinogen, Ur 0.2 0.2 - 1.0 mg/dL   Nitrite, UA Negative Negative   Microscopic Examination See below:    Urinalysis Reflex Comment  Assessment & Plan:   Problem List Items Addressed This Visit      Genitourinary   Microscopic hematuria - Primary    Chronic, stable. Discussed continued excellent fluid intake, and to call with any new sxs. Cr remains  stable and WNL       Other Visit Diagnoses    Proteinuria, unspecified type       appears chronic per review, asymptomatic and renal function excellent. continue to monitor      15 minutes spent in direct care and counseling with patient today.   Follow up plan: Return for as scheduled.

## 2019-02-28 ENCOUNTER — Encounter: Payer: Self-pay | Admitting: Family Medicine

## 2019-02-28 ENCOUNTER — Other Ambulatory Visit: Payer: Self-pay

## 2019-02-28 ENCOUNTER — Ambulatory Visit (INDEPENDENT_AMBULATORY_CARE_PROVIDER_SITE_OTHER): Payer: Medicare Other | Admitting: Family Medicine

## 2019-02-28 VITALS — BP 161/76 | HR 58 | Temp 97.9°F | Ht 67.0 in | Wt 134.0 lb

## 2019-02-28 DIAGNOSIS — I10 Essential (primary) hypertension: Secondary | ICD-10-CM

## 2019-02-28 DIAGNOSIS — R531 Weakness: Secondary | ICD-10-CM

## 2019-02-28 LAB — UA/M W/RFLX CULTURE, ROUTINE
Bilirubin, UA: NEGATIVE
Glucose, UA: NEGATIVE
Leukocytes,UA: NEGATIVE
Nitrite, UA: NEGATIVE
Specific Gravity, UA: >1.03 — ABNORMAL HIGH (ref 1.005–1.030)
Urobilinogen, Ur: 0.2 mg/dL (ref 0.2–1.0)
pH, UA: 6 (ref 5.0–7.5)

## 2019-02-28 LAB — MICROSCOPIC EXAMINATION: Bacteria, UA: NONE SEEN

## 2019-02-28 NOTE — Progress Notes (Signed)
BP (!) 161/76   Pulse (!) 58   Temp 97.9 F (36.6 C) (Oral)   Ht 5\' 7"  (1.702 m)   Wt 134 lb (60.8 kg)   LMP 06/30/1991 (Approximate)   SpO2 99%   BMI 20.99 kg/m    Subjective:    Patient ID: Connie Morrison, female    DOB: [birth year], 73 y.o.   MRN: [id number]  HPI: Connie Morrison is a 73 y.o. female  Chief Complaint  Patient presents with  . Fatigue    patient states that seems like her BP pressure is not doing well and she would like to have some blood work done    . This visit was completed via WebEx due to the restrictions of the COVID-19 pandemic. All issues as above were discussed and addressed. Physical exam was done as above through visual confirmation on WebEx. If it was felt that the patient should be evaluated in the office, they were directed there. The patient verbally consented to this visit. . Location of the patient: home . Location of the provider: home . Those involved with this call:  . Provider: Merrie Roof, PA-C . CMA: Lesle Chris, [place] . Front Desk/Registration: Jill Side  . Time spent on call: 25 minutes with patient face to face via video conference. More than 50% of this time was spent in counseling and coordination of care. 5 minutes total spent in review of patient's record and preparation of their chart. I verified patient identity using two factors (patient name and date of birth). Patient consents verbally to being seen via telemedicine visit today.   Several weeks of hearing blood rushing in her ears and feels faint for a few moments after standing. BPs have been 150s/60s at times when checked. Also started taking goli gummies and a few other supplements and wondering if those are making her feel funny. Does have HCTZ on hand for HTN prn. Took a half tab about a week ago, rarely takes them and never takes a full tab. Typically speaking prior to this her BPs are low normal. Denies CP, SOB, HAs, dizziness. When asked, states she almost exclusively  drinks ginger ale and coffee unless at the gym where she will drink water. Requesting labs to check on things for reassurance.    Relevant past medical, surgical, family and social history reviewed and updated as indicated. Interim medical history since our last visit reviewed. Allergies and medications reviewed and updated.  Review of Systems  Per HPI unless specifically indicated above     Objective:    BP (!) 161/76   Pulse (!) 58   Temp 97.9 F (36.6 C) (Oral)   Ht 5\' 7"  (1.702 m)   Wt 134 lb (60.8 kg)   LMP 06/30/1991 (Approximate)   SpO2 99%   BMI 20.99 kg/m   Wt Readings from Last 3 Encounters:  02/28/19 134 lb (60.8 kg)  12/21/18 137 lb (62.1 kg)  12/01/18 137 lb 2 oz (62.2 kg)    Physical Exam Vitals signs and nursing note reviewed.  Constitutional:      General: She is not in acute distress.    Appearance: Normal appearance.  HENT:     Head: Atraumatic.     Right Ear: External ear normal.     Left Ear: External ear normal.     Nose: Nose normal. No congestion.     Mouth/Throat:     Mouth: Mucous membranes are moist.     Pharynx: Oropharynx is  clear. No posterior oropharyngeal erythema.  Eyes:     Extraocular Movements: Extraocular movements intact.     Conjunctiva/sclera: Conjunctivae normal.  Neck:     Musculoskeletal: Normal range of motion.  Cardiovascular:     Comments: Unable to assess via virtual visit Pulmonary:     Effort: Pulmonary effort is normal. No respiratory distress.  Musculoskeletal: Normal range of motion.  Skin:    General: Skin is dry.     Findings: No erythema.  Neurological:     Mental Status: She is alert and oriented to person, place, and time.  Psychiatric:        Mood and Affect: Mood normal.        Thought Content: Thought content normal.        Judgment: Judgment normal.     Results for orders placed or performed in visit on 02/28/19  Microscopic Examination  Result Value Ref Range   WBC, UA 0-5 0 - 5 /hpf   RBC  11-30 (A) 0 - 2 /hpf   Epithelial Cells (non renal) 0-10 0 - 10 /hpf   Casts Present None seen /lpf   Cast Type Granular casts (A) N/A   Mucus, UA Present Not Estab.   Bacteria, UA None seen None seen/Few  UA/M w/rflx Culture, Routine   Specimen: Urine   URINE  Result Value Ref Range   Specific Gravity, UA >1.030 (H) 1.005 - 1.030   pH, UA 6.0 5.0 - 7.5   Color, UA Yellow Yellow   Appearance Ur Clear Clear   Leukocytes,UA Negative Negative   Protein,UA 2+ (A) Negative/Trace   Glucose, UA Negative Negative   Ketones, UA Trace (A) Negative   RBC, UA 3+ (A) Negative   Bilirubin, UA Negative Negative   Urobilinogen, Ur 0.2 0.2 - 1.0 mg/dL   Nitrite, UA Negative Negative   Microscopic Examination See below:   CBC with Differential/Platelet out  Result Value Ref Range   WBC 3.8 3.4 - 10.8 x10E3/uL   RBC 4.70 3.77 - 5.28 x10E6/uL   Hemoglobin 14.3 11.1 - 15.9 g/dL   Hematocrit 43.5 34.0 - 46.6 %   MCV 93 79 - 97 fL   MCH 30.4 26.6 - 33.0 pg   MCHC 32.9 31.5 - 35.7 g/dL   RDW 12.6 11.7 - 15.4 %   Platelets 223 150 - 450 x10E3/uL   Neutrophils 45 Not Estab. %   Lymphs 41 Not Estab. %   Monocytes 11 Not Estab. %   Eos 2 Not Estab. %   Basos 1 Not Estab. %   Neutrophils Absolute 1.7 1.4 - 7.0 x10E3/uL   Lymphocytes Absolute 1.6 0.7 - 3.1 x10E3/uL   Monocytes Absolute 0.4 0.1 - 0.9 x10E3/uL   EOS (ABSOLUTE) 0.1 0.0 - 0.4 x10E3/uL   Basophils Absolute 0.0 0.0 - 0.2 x10E3/uL   Immature Granulocytes 0 Not Estab. %   Immature Grans (Abs) 0.0 0.0 - 0.1 x10E3/uL  Comprehensive metabolic panel  Result Value Ref Range   Glucose 89 65 - 99 mg/dL   BUN 20 8 - 27 mg/dL   Creatinine, Ser 0.92 0.57 - 1.00 mg/dL   GFR calc non Af Amer 62 >59 mL/min/1.73   GFR calc Af Amer 71 >59 mL/min/1.73   BUN/Creatinine Ratio 22 12 - 28   Sodium 144 134 - 144 mmol/L   Potassium 4.0 3.5 - 5.2 mmol/L   Chloride 102 96 - 106 mmol/L   CO2 28 20 - 29 mmol/L   Calcium  9.6 8.7 - 10.3 mg/dL   Total  Protein 7.4 6.0 - 8.5 g/dL   Albumin 4.7 3.7 - 4.7 g/dL   Globulin, Total 2.7 1.5 - 4.5 g/dL   Albumin/Globulin Ratio 1.7 1.2 - 2.2   Bilirubin Total 0.5 0.0 - 1.2 mg/dL   Alkaline Phosphatase 69 39 - 117 IU/L   AST 25 0 - 40 IU/L   ALT 16 0 - 32 IU/L      Assessment & Plan:   Problem List Items Addressed This Visit      Cardiovascular and Mediastinum   Hypertension    Will have her come in for labs and BP check this morning. Keep log of readings, prn HCTZ if elevated. Suspect most of her current sxs related to poor hydration. Reviewed increasing water or other hydrating fluid intake.        Other Visit Diagnoses    Generalized weakness    -  Primary   Await lab results, increase fluids. Continue to monitor closely   Relevant Orders   UA/M w/rflx Culture, Routine (Completed)   CBC with Differential/Platelet out (Completed)   Comprehensive metabolic panel (Completed)       Follow up plan: Return in about 4 weeks (around 03/28/2019) for weakness f/u.

## 2019-03-01 ENCOUNTER — Ambulatory Visit: Payer: Medicare Other | Admitting: Family Medicine

## 2019-03-01 LAB — CBC WITH DIFFERENTIAL/PLATELET
Basophils Absolute: 0 10*3/uL (ref 0.0–0.2)
Basos: 1 %
EOS (ABSOLUTE): 0.1 10*3/uL (ref 0.0–0.4)
Eos: 2 %
Hematocrit: 43.5 % (ref 34.0–46.6)
Hemoglobin: 14.3 g/dL (ref 11.1–15.9)
Immature Grans (Abs): 0 10*3/uL (ref 0.0–0.1)
Immature Granulocytes: 0 %
Lymphocytes Absolute: 1.6 10*3/uL (ref 0.7–3.1)
Lymphs: 41 %
MCH: 30.4 pg (ref 26.6–33.0)
MCHC: 32.9 g/dL (ref 31.5–35.7)
MCV: 93 fL (ref 79–97)
Monocytes Absolute: 0.4 10*3/uL (ref 0.1–0.9)
Monocytes: 11 %
Neutrophils Absolute: 1.7 10*3/uL (ref 1.4–7.0)
Neutrophils: 45 %
Platelets: 223 10*3/uL (ref 150–450)
RBC: 4.7 x10E6/uL (ref 3.77–5.28)
RDW: 12.6 % (ref 11.7–15.4)
WBC: 3.8 10*3/uL (ref 3.4–10.8)

## 2019-03-01 LAB — COMPREHENSIVE METABOLIC PANEL
ALT: 16 IU/L (ref 0–32)
AST: 25 IU/L (ref 0–40)
Albumin/Globulin Ratio: 1.7 (ref 1.2–2.2)
Albumin: 4.7 g/dL (ref 3.7–4.7)
Alkaline Phosphatase: 69 IU/L (ref 39–117)
BUN/Creatinine Ratio: 22 (ref 12–28)
BUN: 20 mg/dL (ref 8–27)
Bilirubin Total: 0.5 mg/dL (ref 0.0–1.2)
CO2: 28 mmol/L (ref 20–29)
Calcium: 9.6 mg/dL (ref 8.7–10.3)
Chloride: 102 mmol/L (ref 96–106)
Creatinine, Ser: 0.92 mg/dL (ref 0.57–1.00)
GFR calc Af Amer: 71 mL/min/{1.73_m2} (ref >59)
GFR calc non Af Amer: 62 mL/min/{1.73_m2} (ref >59)
Globulin, Total: 2.7 g/dL (ref 1.5–4.5)
Glucose: 89 mg/dL (ref 65–99)
Potassium: 4 mmol/L (ref 3.5–5.2)
Sodium: 144 mmol/L (ref 134–144)
Total Protein: 7.4 g/dL (ref 6.0–8.5)

## 2019-03-05 NOTE — Assessment & Plan Note (Signed)
Will have her come in for labs and BP check this morning. Keep log of readings, prn HCTZ if elevated. Suspect most of her current sxs related to poor hydration. Reviewed increasing water or other hydrating fluid intake.

## 2019-03-29 ENCOUNTER — Ambulatory Visit (INDEPENDENT_AMBULATORY_CARE_PROVIDER_SITE_OTHER): Payer: Medicare Other | Admitting: Family Medicine

## 2019-03-29 ENCOUNTER — Encounter: Payer: Self-pay | Admitting: Family Medicine

## 2019-03-29 ENCOUNTER — Other Ambulatory Visit: Payer: Self-pay

## 2019-03-29 VITALS — BP 129/59 | HR 60 | Ht 67.0 in | Wt 134.0 lb

## 2019-03-29 DIAGNOSIS — I10 Essential (primary) hypertension: Secondary | ICD-10-CM | POA: Diagnosis not present

## 2019-03-29 MED ORDER — AMLODIPINE BESYLATE 2.5 MG PO TABS: 2.5000 mg | ORAL_TABLET | Freq: Every day | ORAL | 0 refills | Status: DC

## 2019-03-29 NOTE — Assessment & Plan Note (Signed)
Will remove HCTZ off her med list as she does not wish to take it. Start low dose amlodipine and continue close home monitoring. Continue to drink more fluids. F/u if persistent abnormal readings or side effects.

## 2019-03-29 NOTE — Progress Notes (Signed)
BP (!) 129/59   Pulse 60   Ht 5\' 7"  (1.702 m)   Wt 134 lb (60.8 kg)   LMP 06/30/1991 (Approximate)   BMI 20.99 kg/m    Subjective:    Patient ID: Connie Morrison, female    DOB: 10/26/1945, 73 y.o.   MRN: [id number]  HPI: Connie Morrison is a 73 y.o. female  Chief Complaint  Patient presents with  . Fatigue    4w f/u    . This visit was completed via telephone due to the restrictions of the COVID-19 pandemic. All issues as above were discussed and addressed. Physical exam was done as above through visual confirmation on telephone. If it was felt that the patient should be evaluated in the office, they were directed there. The patient verbally consented to this visit. . Location of the patient: home . Location of the provider: work . Those involved with this call:  . Provider: Merrie Roof, PA-C . CMA: Lesle Chris, [place] . Front Desk/Registration: Jill Side  . Time spent on call: 15 minutes on the phone discussing health concerns. 5 minutes total spent in review of patient's record and preparation of their chart. I verified patient identity using two factors (patient name and date of birth). Patient consents verbally to being seen via telemedicine visit today.   Patient presenting today for weakness follow up. Has been working on drinking more fluids and notes she's feeling much better. Only concern now is that her BPs have been staying high more often than not. Some days high [id number] systolic but most often it's running 150/80s range the past month. Denies HAs, dizziness, CP, SOB associated. Has HCTZ at home but does not take them and does not wish to.   Relevant past medical, surgical, family and social history reviewed and updated as indicated. Interim medical history since our last visit reviewed. Allergies and medications reviewed and updated.  Review of Systems  Per HPI unless specifically indicated above     Objective:    BP (!) 129/59   Pulse 60   Ht 5\' 7"  (1.702 m)    Wt 134 lb (60.8 kg)   LMP 06/30/1991 (Approximate)   BMI 20.99 kg/m   Wt Readings from Last 3 Encounters:  03/29/19 134 lb (60.8 kg)  02/28/19 134 lb (60.8 kg)  12/21/18 137 lb (62.1 kg)    Physical Exam  Unable to perform PE due to patient lack of access to video technology for today's visit.   Results for orders placed or performed in visit on 02/28/19  Microscopic Examination  Result Value Ref Range   WBC, UA 0-5 0 - 5 /hpf   RBC 11-30 (A) 0 - 2 /hpf   Epithelial Cells (non renal) 0-10 0 - 10 /hpf   Casts Present None seen /lpf   Cast Type Granular casts (A) N/A   Mucus, UA Present Not Estab.   Bacteria, UA None seen None seen/Few  UA/M w/rflx Culture, Routine   Specimen: Urine   URINE  Result Value Ref Range   Specific Gravity, UA >1.030 (H) 1.005 - 1.030   pH, UA 6.0 5.0 - 7.5   Color, UA Yellow Yellow   Appearance Ur Clear Clear   Leukocytes,UA Negative Negative   Protein,UA 2+ (A) Negative/Trace   Glucose, UA Negative Negative   Ketones, UA Trace (A) Negative   RBC, UA 3+ (A) Negative   Bilirubin, UA Negative Negative   Urobilinogen, Ur 0.2 0.2 - 1.0 mg/dL  Nitrite, UA Negative Negative   Microscopic Examination See below:   CBC with Differential/Platelet out  Result Value Ref Range   WBC 3.8 3.4 - 10.8 x10E3/uL   RBC 4.70 3.77 - 5.28 x10E6/uL   Hemoglobin 14.3 11.1 - 15.9 g/dL   Hematocrit 43.5 34.0 - 46.6 %   MCV 93 79 - 97 fL   MCH 30.4 26.6 - 33.0 pg   MCHC 32.9 31.5 - 35.7 g/dL   RDW 12.6 11.7 - 15.4 %   Platelets 223 150 - 450 x10E3/uL   Neutrophils 45 Not Estab. %   Lymphs 41 Not Estab. %   Monocytes 11 Not Estab. %   Eos 2 Not Estab. %   Basos 1 Not Estab. %   Neutrophils Absolute 1.7 1.4 - 7.0 x10E3/uL   Lymphocytes Absolute 1.6 0.7 - 3.1 x10E3/uL   Monocytes Absolute 0.4 0.1 - 0.9 x10E3/uL   EOS (ABSOLUTE) 0.1 0.0 - 0.4 x10E3/uL   Basophils Absolute 0.0 0.0 - 0.2 x10E3/uL   Immature Granulocytes 0 Not Estab. %   Immature Grans (Abs)  0.0 0.0 - 0.1 x10E3/uL  Comprehensive metabolic panel  Result Value Ref Range   Glucose 89 65 - 99 mg/dL   BUN 20 8 - 27 mg/dL   Creatinine, Ser 0.92 0.57 - 1.00 mg/dL   GFR calc non Af Amer 62 >59 mL/min/1.73   GFR calc Af Amer 71 >59 mL/min/1.73   BUN/Creatinine Ratio 22 12 - 28   Sodium 144 134 - 144 mmol/L   Potassium 4.0 3.5 - 5.2 mmol/L   Chloride 102 96 - 106 mmol/L   CO2 28 20 - 29 mmol/L   Calcium 9.6 8.7 - 10.3 mg/dL   Total Protein 7.4 6.0 - 8.5 g/dL   Albumin 4.7 3.7 - 4.7 g/dL   Globulin, Total 2.7 1.5 - 4.5 g/dL   Albumin/Globulin Ratio 1.7 1.2 - 2.2   Bilirubin Total 0.5 0.0 - 1.2 mg/dL   Alkaline Phosphatase 69 39 - 117 IU/L   AST 25 0 - 40 IU/L   ALT 16 0 - 32 IU/L      Assessment & Plan:   Problem List Items Addressed This Visit      Cardiovascular and Mediastinum   Hypertension - Primary    Will remove HCTZ off her med list as she does not wish to take it. Start low dose amlodipine and continue close home monitoring. Continue to drink more fluids. F/u if persistent abnormal readings or side effects.       Relevant Medications   amLODipine (NORVASC) 2.5 MG tablet       Follow up plan: Return for as scheduled.

## 2019-05-25 ENCOUNTER — Ambulatory Visit: Payer: Medicare PPO | Attending: Internal Medicine

## 2019-05-25 DIAGNOSIS — Z23 Encounter for immunization: Secondary | ICD-10-CM | POA: Insufficient documentation

## 2019-05-25 NOTE — Progress Notes (Signed)
   Covid-19 Vaccination Clinic  Name:  Connie Morrison    MRN: [id number] DOB: Nov 17, 1945  05/25/2019  [name] was observed post Covid-19 immunization for 30 minutes based on pre-vaccination screening without incidence. She was provided with Vaccine Information Sheet and instruction to access the V-Safe system.   [name] was instructed to call 911 with any severe reactions post vaccine: Marland Kitchen Difficulty breathing  . Swelling of your face and throat  . A fast heartbeat  . A bad rash all over your body  . Dizziness and weakness    Immunizations Administered    Name Date Dose VIS Date Route   Pfizer COVID-19 Vaccine 05/25/2019 12:10 PM 0.3 mL 03/10/2019 Intramuscular   Manufacturer: [place]   Lot: [id number]   [place]: [id number]

## 2019-06-02 ENCOUNTER — Ambulatory Visit: Payer: Medicare Other | Admitting: Family Medicine

## 2019-06-08 ENCOUNTER — Ambulatory Visit (INDEPENDENT_AMBULATORY_CARE_PROVIDER_SITE_OTHER): Payer: Medicare PPO

## 2019-06-08 ENCOUNTER — Ambulatory Visit (INDEPENDENT_AMBULATORY_CARE_PROVIDER_SITE_OTHER): Payer: Medicare PPO | Admitting: Family Medicine

## 2019-06-08 ENCOUNTER — Other Ambulatory Visit: Payer: Self-pay

## 2019-06-08 VITALS — BP 122/72 | HR 60 | Temp 98.0°F | Ht 66.0 in | Wt 137.0 lb

## 2019-06-08 VITALS — BP 122/72 | HR 60 | Temp 98.0°F | Ht 66.0 in | Wt 137.8 lb

## 2019-06-08 DIAGNOSIS — E7800 Pure hypercholesterolemia, unspecified: Secondary | ICD-10-CM | POA: Diagnosis not present

## 2019-06-08 DIAGNOSIS — Z1231 Encounter for screening mammogram for malignant neoplasm of breast: Secondary | ICD-10-CM | POA: Diagnosis not present

## 2019-06-08 DIAGNOSIS — Z0000 Encounter for general adult medical examination without abnormal findings: Secondary | ICD-10-CM | POA: Diagnosis not present

## 2019-06-08 DIAGNOSIS — F325 Major depressive disorder, single episode, in full remission: Secondary | ICD-10-CM | POA: Diagnosis not present

## 2019-06-08 DIAGNOSIS — I10 Essential (primary) hypertension: Secondary | ICD-10-CM | POA: Diagnosis not present

## 2019-06-08 MED ORDER — AMLODIPINE BESYLATE 5 MG PO TABS: 5.0000 mg | ORAL_TABLET | Freq: Every day | ORAL | 3 refills | Status: DC

## 2019-06-08 MED ORDER — SIMVASTATIN 20 MG PO TABS: 20.0000 mg | ORAL_TABLET | Freq: Every day | ORAL | 1 refills | Status: DC

## 2019-06-08 MED ORDER — FLUOXETINE HCL 10 MG PO CAPS: 30.0000 mg | ORAL_CAPSULE | Freq: Every day | ORAL | 1 refills | Status: DC

## 2019-06-08 MED ORDER — MUPIROCIN 2 % EX OINT: 1.0000 "application " | TOPICAL_OINTMENT | Freq: Two times a day (BID) | CUTANEOUS | 1 refills | Status: DC | PRN

## 2019-06-08 NOTE — Progress Notes (Signed)
BP 122/72   Pulse 60   Temp 98 F (36.7 C) (Oral)   Ht 5\' 6"  (1.676 m)   Wt 137 lb (62.1 kg)   LMP 06/30/1991 (Approximate)   SpO2 98%   BMI 22.11 kg/m    Subjective:    Patient ID: Connie Morrison, female    DOB: 1945-11-23, 74 y.o.   MRN: [id number]  HPI: HEATH CAVERLY is a 74 y.o. female  Chief Complaint  Patient presents with  . Hypertension   Patient presenting today for 6 month f/u chronic conditions.   HTN - Home BPs stable around 120/70s when checked. Tolerating medication well, denies CP, SOB, HAs, dizziness. Stays active, eats well and stays active.   HLD - On simvastatin, denies claudication, myalgias.   Depression - Prozac doing well with moods, stable without concerns. Denies SI/HI.   Depression screen [facility] 2/9 06/08/2019 03/29/2019 12/01/2018  Decreased Interest 0 0 0  Down, Depressed, Hopeless 0 0 0  PHQ - 2 Score 0 0 0  Altered sleeping - 0 1  Tired, decreased energy - 0 0  Change in appetite - 0 0  Feeling bad or failure about yourself  - 0 0  Trouble concentrating - 0 0  Moving slowly or fidgety/restless - 0 0  Suicidal thoughts - 0 0  PHQ-9 Score - 0 1  Difficult doing work/chores - - Not difficult at all  No flowsheet data found.    Relevant past medical, surgical, family and social history reviewed and updated as indicated. Interim medical history since our last visit reviewed. Allergies and medications reviewed and updated.  Review of Systems  Per HPI unless specifically indicated above     Objective:    BP 122/72   Pulse 60   Temp 98 F (36.7 C) (Oral)   Ht 5\' 6"  (1.676 m)   Wt 137 lb (62.1 kg)   LMP 06/30/1991 (Approximate)   SpO2 98%   BMI 22.11 kg/m   Wt Readings from Last 3 Encounters:  06/08/19 137 lb (62.1 kg)  06/08/19 137 lb 12.8 oz (62.5 kg)  03/29/19 134 lb (60.8 kg)    Physical Exam Vitals and nursing note reviewed.  Constitutional:      Appearance: Normal appearance. She is not ill-appearing.  HENT:     Head:  Atraumatic.  Eyes:     Extraocular Movements: Extraocular movements intact.     Conjunctiva/sclera: Conjunctivae normal.  Cardiovascular:     Rate and Rhythm: Normal rate and regular rhythm.     Heart sounds: Normal heart sounds.  Pulmonary:     Effort: Pulmonary effort is normal.     Breath sounds: Normal breath sounds.  Musculoskeletal:        General: Normal range of motion.     Cervical back: Normal range of motion and neck supple.  Skin:    General: Skin is warm and dry.  Neurological:     Mental Status: She is alert and oriented to person, place, and time.  Psychiatric:        Mood and Affect: Mood normal.        Thought Content: Thought content normal.        Judgment: Judgment normal.     Results for orders placed or performed in visit on 06/08/19  Comprehensive metabolic panel  Result Value Ref Range   Glucose 86 65 - 99 mg/dL   BUN 15 8 - 27 mg/dL   Creatinine, Ser 0.87 0.57 -  1.00 mg/dL   GFR calc non Af Amer 66 >59 mL/min/1.73   GFR calc Af Amer 76 >59 mL/min/1.73   BUN/Creatinine Ratio 17 12 - 28   Sodium 143 134 - 144 mmol/L   Potassium 4.2 3.5 - 5.2 mmol/L   Chloride 102 96 - 106 mmol/L   CO2 27 20 - 29 mmol/L   Calcium 9.1 8.7 - 10.3 mg/dL   Total Protein 6.2 6.0 - 8.5 g/dL   Albumin 4.0 3.7 - 4.7 g/dL   Globulin, Total 2.2 1.5 - 4.5 g/dL   Albumin/Globulin Ratio 1.8 1.2 - 2.2   Bilirubin Total 0.3 0.0 - 1.2 mg/dL   Alkaline Phosphatase 57 39 - 117 IU/L   AST 23 0 - 40 IU/L   ALT 15 0 - 32 IU/L  Lipid Panel w/o Chol/HDL Ratio  Result Value Ref Range   Cholesterol, Total 185 100 - 199 mg/dL   Triglycerides 159 (H) 0 - 149 mg/dL   HDL 64 >39 mg/dL   VLDL Cholesterol Cal 27 5 - 40 mg/dL   LDL Chol Calc (NIH) 94 0 - 99 mg/dL      Assessment & Plan:   Problem List Items Addressed This Visit      Cardiovascular and Mediastinum   Hypertension - Primary    BPs stable and under good control, continue current regimen      Relevant Medications    amLODipine (NORVASC) 5 MG tablet   simvastatin (ZOCOR) 20 MG tablet   Other Relevant Orders   Comprehensive metabolic panel (Completed)     Other   Hyperlipidemia    Recheck lipids, adjust as needed. Continue current regimen and good diet and exercise      Relevant Medications   amLODipine (NORVASC) 5 MG tablet   simvastatin (ZOCOR) 20 MG tablet   Other Relevant Orders   Lipid Panel w/o Chol/HDL Ratio (Completed)   Depression, major, single episode, complete remission (HCC)    Moods stable and under good control, continue current regimen      Relevant Medications   FLUoxetine (PROZAC) 10 MG capsule       Follow up plan: Return in about 6 months (around 12/09/2019) for 6 month f/u.

## 2019-06-08 NOTE — Progress Notes (Signed)
Subjective:   Connie Morrison is a 74 y.o. female who presents for Medicare Annual (Subsequent) preventive examination.  Review of Systems:  Cardiac Risk Factors include: advanced age (>65men, >53 women);dyslipidemia;hypertension     Objective:     Vitals: BP 122/72 (BP Location: Left Arm, Patient Position: Sitting, Cuff Size: Normal)   Pulse 60   Temp 98 F (36.7 C) (Oral)   Ht 5\' 6"  (1.676 m)   Wt 137 lb 12.8 oz (62.5 kg)   LMP 06/30/1991 (Approximate)   SpO2 98%   BMI 22.24 kg/m   Body mass index is 22.24 kg/m.  Advanced Directives 06/08/2019 11/23/2018 11/02/2018 11/02/2017 07/24/2016 07/19/2015  Does Patient Have a Medical Advance Directive? Yes Yes Yes Yes Yes Yes  Type of Advance Directive Living will;Healthcare Power of [place];Living will [place];Living will [place];Living will [place];Living will [place];Living will  Does patient want to make changes to medical advance directive? - No - Patient declined No - Patient declined - - -  Copy of [place] in Chart? No - copy requested Yes - validated most recent copy scanned in chart (See row information) No - copy requested - Yes -    Tobacco Social History   Tobacco Use  Smoking Status Former Smoker  . Types: Cigarettes  . Quit date: 42  . Years since quitting: 41.2  Smokeless Tobacco Never Used     Counseling given: Not Answered   Clinical Intake:  Pre-visit preparation completed: Yes  Pain : No/denies pain     Nutritional Status: BMI of 19-24  Normal Nutritional Risks: None Diabetes: No  How often do you need to have someone help you when you read instructions, pamphlets, or other written materials from your doctor or pharmacy?: 1 - Never  Interpreter Needed?: No  Information entered by :: Josephanthony Tindel,LPN  Past Medical History:  Diagnosis Date  . Allergic rhinitis   .  Anginal pain ([place])   . Anxiety   . Complication of anesthesia    itching after anesthesia (50 yrs ago)  . Depression   . Dyspnea    (pt denies)  . GERD (gastroesophageal reflux disease)   . Hematuria   . Hyperlipidemia   . Hypertension    (pt denies)  . Menopause   . Microscopic hematuria   . Vaginal atrophy   . Wears dentures    lower partial   Past Surgical History:  Procedure Laterality Date  . BREAST SURGERY     cyst removed  . CATARACT EXTRACTION W/PHACO Left 11/02/2018   Procedure: CATARACT EXTRACTION PHACO AND INTRAOCULAR LENS PLACEMENT ([place])  LEFT;  Surgeon: Leandrew Koyanagi, MD;  Location: [facility];  Service: Ophthalmology;  Laterality: Left;  . CATARACT EXTRACTION W/PHACO Right 11/23/2018   Procedure: CATARACT EXTRACTION PHACO AND INTRAOCULAR LENS PLACEMENT ([place]) - right  00:50.6  17.1;  Surgeon: Leandrew Koyanagi, MD;  Location: [facility];  Service: Ophthalmology;  Laterality: Right;  . COLONOSCOPY    . COLONOSCOPY WITH PROPOFOL N/A 11/02/2017   Procedure: COLONOSCOPY WITH PROPOFOL;  Surgeon: Lollie Sails, MD;  Location[facility] END[facility]COPY;  Service: Endoscopy;  Laterality: N/A;  . TONSILLECTOMY    . TUMOR EXCISION Right    kidney   Family History  Problem Relation Age of Onset  . Heart disease Father   . Cancer Sister        lung  . Cancer  Brother        colon  . Diabetes Brother   . Stroke Maternal Grandmother   . Cancer Maternal Grandfather        lung  . Cancer Sister        lung  . COPD Sister   . Heart disease Brother    Social History   Socioeconomic History  . Marital status: Married    Spouse name: Not on file  . Number of children: Not on file  . Years of education: Not on file  . Highest education level: Not on file  Occupational History  . Occupation: retired   Tobacco Use  . Smoking status: Former Smoker    Types: Cigarettes    Quit date: 1980    Years since quitting: 41.2  . Smokeless tobacco: Never Used   Substance and Sexual Activity  . Alcohol use: No    Alcohol/week: 0.0 standard drinks  . Drug use: No  . Sexual activity: Yes  Other Topics Concern  . Not on file  Social History Narrative  . Not on file   Social Determinants of Health   Financial Resource Strain: Low Risk   . Difficulty of Paying Living Expenses: Not hard at all  Food Insecurity: No Food Insecurity  . Worried About Charity fundraiser in the Last Year: Never true  . Ran Out of Food in the Last Year: Never true  Transportation Needs: No Transportation Needs  . Lack of Transportation (Medical): No  . Lack of Transportation (Non-Medical): No  Physical Activity: Sufficiently Active  . Days of Exercise per Week: 3 days  . Minutes of Exercise per Session: 90 min  Stress:   . Feeling of Stress :   Social Connections: Not Isolated  . Frequency of Communication with Friends and Family: More than three times a week  . Frequency of Social Gatherings with Friends and Family: More than three times a week  . Attends Religious Services: More than 4 times per year  . Active Member of Clubs or Organizations: Yes  . Attends Archivist Meetings: More than 4 times per year  . Marital Status: Married    Outpatient Encounter Medications as of 06/08/2019  Medication Sig  . amLODipine (NORVASC) 2.5 MG tablet Take 1 tablet (2.5 mg total) by mouth daily.  . diphenhydramine-acetaminophen (TYLENOL PM) 25-500 MG TABS tablet Take 0.5 tablets by mouth at bedtime as needed.  Marland Kitchen FLUoxetine (PROZAC) 10 MG capsule Take 3 capsules (30 mg total) by mouth daily.  . magnesium 30 MG tablet Take 30 mg by mouth 2 (two) times daily.  . Multiple Vitamin (MULTIVITAMIN) tablet Take 1 tablet by mouth daily.  . simvastatin (ZOCOR) 20 MG tablet Take 1 tablet (20 mg total) by mouth at bedtime.  . tretinoin (RETIN-A) 0.1 % cream APPLY PEA SIZE AMOUNT TO ENTIRE DRY FACE NIGHTLY   No facility-administered encounter medications on file as of  06/08/2019.    Activities of Daily Living In your present state of health, do you have any difficulty performing the following activities: 06/08/2019 11/23/2018  Hearing? N N  Comment no hearing aids -  Vision? N N  Comment no glasses, Dr.Brasington -  Difficulty concentrating or making decisions? N N  Walking or climbing stairs? N N  Dressing or bathing? N N  Doing errands, shopping? N -  Preparing Food and eating ? N -  Using the Toilet? N -  In the past six months, have you accidently  leaked urine? N -  Do you have problems with loss of bowel control? N -  Managing your Medications? N -  Managing your Finances? N -  Housekeeping or managing your Housekeeping? N -  Some recent data might be hidden    Patient Care Team: Volney American, PA-C as PCP - General (Family Medicine)    Assessment:   This is a routine wellness examination for [place].  Exercise Activities and Dietary recommendations Current Exercise Habits: Home exercise routine, Type of exercise: walking;strength training/weights;treadmill;stretching(gym), Time (Minutes): > 60(1.5 hours), Frequency (Times/Week): 3, Weekly Exercise (Minutes/Week): 0, Intensity: Mild, Exercise limited by: None identified  Goals Addressed   None     Fall Risk: Fall Risk  06/08/2019 03/29/2019 08/17/2017 01/11/2017 07/24/2016  Falls in the past year? 0 0 No No No  Number falls in past yr: 0 0 - - -  Injury with Fall? 0 0 - - -    FALL RISK PREVENTION PERTAINING TO THE HOME:  Any stairs in or around the home? No  If so, are there any without handrails? No   Home free of loose throw rugs in walkways, pet beds, electrical cords, etc? Yes  Adequate lighting in your home to reduce risk of falls? Yes   ASSISTIVE DEVICES UTILIZED TO PREVENT FALLS:  Life alert? No  Use of a cane, walker or w/c? No  Grab bars in the bathroom? No  Shower chair or bench in shower? No  Elevated toilet seat or a handicapped toilet? No   DME  ORDERS:  DME order needed?  No   TIMED UP AND GO:  Was the test performed? Yes .  Length of time to ambulate 10 feet: 8 sec.   GAIT:  Appearance of gait: Gait steady and fast without the use of an assistive device.   Education: Fall risk prevention has been discussed.  Intervention(s) required? No   DME/home health order needed?  No    Depression Screen PHQ 2/9 Scores 06/08/2019 03/29/2019 12/01/2018 08/17/2017  PHQ - 2 Score 0 0 0 0  PHQ- 9 Score - 0 1 3     Cognitive Function        Immunization History  Administered Date(s) Administered  . Fluad Quad(high Dose 65+) 12/01/2018  . Influenza, High Dose Seasonal PF 12/27/2015, 01/10/2018  . Influenza-Unspecified 12/23/2012, 12/27/2013  . PFIZER SARS-COV-2 Vaccination 05/25/2019  . Pneumococcal Conjugate-13 07/13/2014  . Pneumococcal Polysaccharide-23 03/15/2013  . Td 12/03/2004  . Zoster 02/05/2011  . Zoster Recombinat (Shingrix) 06/16/2018    Qualifies for Shingles Vaccine? Yes  completed shingrix. Will check with pharmacy to se eif she had second dose.   Tdap: Discussed need for TD/TDAP vaccine, patient verbalized understanding that this is not covered as a preventative with there insurance and to call the office if she develops any new skin injuries, ie: cuts, scrapes, bug bites, or open wounds.  Flu Vaccine: up to date   Pneumococcal Vaccine: up to date   Covid-19 Vaccine: Completed  Screening Tests Health Maintenance  Topic Date Due  . TETANUS/TDAP  06/07/2020 (Originally 12/04/2014)  . MAMMOGRAM  12/01/2019  . COLONOSCOPY  11/02/2020  . INFLUENZA VACCINE  Completed  . DEXA SCAN  Completed  . Hepatitis C Screening  Completed  . PNA vac Low Risk Adult  Completed    Cancer Screenings:  Colorectal Screening: scheduled for March 20th with Dr. Lysle Pearl.   Mammogram: Completed 10/2018. Repeat every year; No longer required.   Bone Density: Completed  2018  Lung Cancer Screening: (Low Dose CT Chest recommended  if Age 75-80 years, 30 pack-year currently smoking OR have quit w/in 15years.) does not qualify.   Additional Screening:  Hepatitis C Screening: does qualify; Completed 2018  Vision Screening: Recommended annual ophthalmology exams for early detection of glaucoma and other disorders of the eye. Is the patient up to date with their annual eye exam?  Yes  Who is the provider or what is the name of the office in which the pt attends annual eye exams? Dr.Brasington    Dental Screening: Recommended annual dental exams for proper oral hygiene  Community Resource Referral:  CRR required this visit?  No       Plan:  I have personally reviewed and addressed the Medicare Annual Wellness questionnaire and have noted the following in the patient's chart:  A. Medical and social history B. Use of alcohol, tobacco or illicit drugs  C. Current medications and supplements D. Functional ability and status E.  Nutritional status F.  Physical activity G. Advance directives H. List of other physicians I.  Hospitalizations, surgeries, and ER visits in previous 12 months J.  [place] such as hearing and vision if needed, cognitive and depression L. Referrals and appointments   In addition, I have reviewed and discussed with patient certain preventive protocols, quality metrics, and best practice recommendations. A written personalized care plan for preventive services as well as general preventive health recommendations were provided to patient.  Signed,    Bevelyn Ngo, LPN  075-GRM Nurse Health Advisor   Nurse Notes:none

## 2019-06-08 NOTE — Patient Instructions (Signed)
Connie Morrison , Thank you for taking time to come for your Medicare Wellness Visit. I appreciate your ongoing commitment to your health goals. Please review the following plan we discussed and let me know if I can assist you in the future.   Screening recommendations/referrals: Colonoscopy: scheduled.  Mammogram: up to date, due 10/2018  Bone Density: up to date  Recommended yearly ophthalmology/optometry visit for glaucoma screening and checkup Recommended yearly dental visit for hygiene and checkup  Vaccinations: Influenza vaccine: up to date  Pneumococcal vaccine: up to date  Tdap vaccine: due now  Shingles vaccine: shingrix done    Covid-19: completed   Advanced directives:Please bring a copy of your health care power of attorney and living will to the office at your convenience.  Conditions/risks identified: none   Next appointment: Follow up in one year for your annual wellness visit    Preventive Care 65 Years and Older, Female Preventive care refers to lifestyle choices and visits with your health care provider that can promote health and wellness. What does preventive care include?  A yearly physical exam. This is also called an annual well check.  Dental exams once or twice a year.  Routine eye exams. Ask your health care provider how often you should have your eyes checked.  Personal lifestyle choices, including:  Daily care of your teeth and gums.  Regular physical activity.  Eating a healthy diet.  Avoiding tobacco and drug use.  Limiting alcohol use.  Practicing safe sex.  Taking low-dose aspirin every day.  Taking vitamin and mineral supplements as recommended by your health care provider. What happens during an annual well check? The services and screenings done by your health care provider during your annual well check will depend on your age, overall health, lifestyle risk factors, and family history of disease. Counseling  Your health care provider  may ask you questions about your:  Alcohol use.  Tobacco use.  Drug use.  Emotional well-being.  Home and relationship well-being.  Sexual activity.  Eating habits.  History of falls.  Memory and ability to understand (cognition).  Work and work Statistician.  Reproductive health. Screening  You may have the following tests or measurements:  Height, weight, and BMI.  Blood pressure.  Lipid and cholesterol levels. These may be checked every 5 years, or more frequently if you are over 12 years old.  Skin check.  Lung cancer screening. You may have this screening every year starting at age 82 if you have a 30-pack-year history of smoking and currently smoke or have quit within the past 15 years.  Fecal occult blood test (FOBT) of the stool. You may have this test every year starting at age 85.  Flexible sigmoidoscopy or colonoscopy. You may have a sigmoidoscopy every 5 years or a colonoscopy every 10 years starting at age 46.  Hepatitis C blood test.  Hepatitis B blood test.  Sexually transmitted disease (STD) testing.  Diabetes screening. This is done by checking your blood sugar (glucose) after you have not eaten for a while (fasting). You may have this done every 1-3 years.  Bone density scan. This is done to screen for osteoporosis. You may have this done starting at age 21.  Mammogram. This may be done every 1-2 years. Talk to your health care provider about how often you should have regular mammograms. Talk with your health care provider about your test results, treatment options, and if necessary, the need for more tests. Vaccines  Your health  care provider may recommend certain vaccines, such as:  Influenza vaccine. This is recommended every year.  Tetanus, diphtheria, and acellular pertussis (Tdap, Td) vaccine. You may need a Td booster every 10 years.  Zoster vaccine. You may need this after age 42.  Pneumococcal 13-valent conjugate (PCV13) vaccine.  One dose is recommended after age 65.  Pneumococcal polysaccharide (PPSV23) vaccine. One dose is recommended after age 75. Talk to your health care provider about which screenings and vaccines you need and how often you need them. This information is not intended to replace advice given to you by your health care provider. Make sure you discuss any questions you have with your health care provider. Document Released: 04/12/2015 Document Revised: 12/04/2015 Document Reviewed: 01/15/2015 Elsevier Interactive Patient Education  2017 [place] Prevention in the Home Falls can cause injuries. They can happen to people of all ages. There are many things you can do to make your home safe and to help prevent falls. What can I do on the outside of my home?  Regularly fix the edges of walkways and driveways and fix any cracks.  Remove anything that might make you trip as you walk through a door, such as a raised step or threshold.  Trim any bushes or trees on the path to your home.  Use bright outdoor lighting.  Clear any walking paths of anything that might make someone trip, such as rocks or tools.  Regularly check to see if handrails are loose or broken. Make sure that both sides of any steps have handrails.  Any raised decks and porches should have guardrails on the edges.  Have any leaves, snow, or ice cleared regularly.  Use sand or salt on walking paths during winter.  Clean up any spills in your garage right away. This includes oil or grease spills. What can I do in the bathroom?  Use night lights.  Install grab bars by the toilet and in the tub and shower. Do not use towel bars as grab bars.  Use non-skid mats or decals in the tub or shower.  If you need to sit down in the shower, use a plastic, non-slip stool.  Keep the floor dry. Clean up any water that spills on the floor as soon as it happens.  Remove soap buildup in the tub or shower regularly.  Attach bath  mats securely with double-sided non-slip rug tape.  Do not have throw rugs and other things on the floor that can make you trip. What can I do in the bedroom?  Use night lights.  Make sure that you have a light by your bed that is easy to reach.  Do not use any sheets or blankets that are too big for your bed. They should not hang down onto the floor.  Have a firm chair that has side arms. You can use this for support while you get dressed.  Do not have throw rugs and other things on the floor that can make you trip. What can I do in the kitchen?  Clean up any spills right away.  Avoid walking on wet floors.  Keep items that you use a lot in easy-to-reach places.  If you need to reach something above you, use a strong step stool that has a grab bar.  Keep electrical cords out of the way.  Do not use floor polish or wax that makes floors slippery. If you must use wax, use non-skid floor wax.  Do not have  throw rugs and other things on the floor that can make you trip. What can I do with my stairs?  Do not leave any items on the stairs.  Make sure that there are handrails on both sides of the stairs and use them. Fix handrails that are broken or loose. Make sure that handrails are as long as the stairways.  Check any carpeting to make sure that it is firmly attached to the stairs. Fix any carpet that is loose or worn.  Avoid having throw rugs at the top or bottom of the stairs. If you do have throw rugs, attach them to the floor with carpet tape.  Make sure that you have a light switch at the top of the stairs and the bottom of the stairs. If you do not have them, ask someone to add them for you. What else can I do to help prevent falls?  Wear shoes that:  Do not have high heels.  Have rubber bottoms.  Are comfortable and fit you well.  Are closed at the toe. Do not wear sandals.  If you use a stepladder:  Make sure that it is fully opened. Do not climb a closed  stepladder.  Make sure that both sides of the stepladder are locked into place.  Ask someone to hold it for you, if possible.  Clearly mark and make sure that you can see:  Any grab bars or handrails.  First and last steps.  Where the edge of each step is.  Use tools that help you move around (mobility aids) if they are needed. These include:  Canes.  Walkers.  Scooters.  Crutches.  Turn on the lights when you go into a dark area. Replace any light bulbs as soon as they burn out.  Set up your furniture so you have a clear path. Avoid moving your furniture around.  If any of your floors are uneven, fix them.  If there are any pets around you, be aware of where they are.  Review your medicines with your doctor. Some medicines can make you feel dizzy. This can increase your chance of falling. Ask your doctor what other things that you can do to help prevent falls. This information is not intended to replace advice given to you by your health care provider. Make sure you discuss any questions you have with your health care provider. Document Released: 01/10/2009 Document Revised: 08/22/2015 Document Reviewed: 04/20/2014 Elsevier Interactive Patient Education  2017 [employer].

## 2019-06-09 ENCOUNTER — Encounter: Payer: Self-pay | Admitting: Family Medicine

## 2019-06-09 LAB — COMPREHENSIVE METABOLIC PANEL
ALT: 15 IU/L (ref 0–32)
AST: 23 IU/L (ref 0–40)
Albumin/Globulin Ratio: 1.8 (ref 1.2–2.2)
Albumin: 4 g/dL (ref 3.7–4.7)
Alkaline Phosphatase: 57 IU/L (ref 39–117)
BUN/Creatinine Ratio: 17 (ref 12–28)
BUN: 15 mg/dL (ref 8–27)
Bilirubin Total: 0.3 mg/dL (ref 0.0–1.2)
CO2: 27 mmol/L (ref 20–29)
Calcium: 9.1 mg/dL (ref 8.7–10.3)
Chloride: 102 mmol/L (ref 96–106)
Creatinine, Ser: 0.87 mg/dL (ref 0.57–1.00)
GFR calc Af Amer: 76 mL/min/{1.73_m2} (ref >59)
GFR calc non Af Amer: 66 mL/min/{1.73_m2} (ref >59)
Globulin, Total: 2.2 g/dL (ref 1.5–4.5)
Glucose: 86 mg/dL (ref 65–99)
Potassium: 4.2 mmol/L (ref 3.5–5.2)
Sodium: 143 mmol/L (ref 134–144)
Total Protein: 6.2 g/dL (ref 6.0–8.5)

## 2019-06-09 LAB — LIPID PANEL W/O CHOL/HDL RATIO
Cholesterol, Total: 185 mg/dL (ref 100–199)
HDL: 64 mg/dL (ref >39)
LDL Chol Calc (NIH): 94 mg/dL (ref 0–99)
Triglycerides: 159 mg/dL — ABNORMAL HIGH (ref 0–149)
VLDL Cholesterol Cal: 27 mg/dL (ref 5–40)

## 2019-06-13 NOTE — Assessment & Plan Note (Signed)
Moods stable and under good control, continue current regimen

## 2019-06-13 NOTE — Assessment & Plan Note (Signed)
BPs stable and under good control, continue current regimen 

## 2019-06-13 NOTE — Assessment & Plan Note (Signed)
Recheck lipids, adjust as needed. Continue current regimen and good diet and exercise

## 2019-06-14 ENCOUNTER — Encounter: Payer: Self-pay | Admitting: Family Medicine

## 2019-06-19 LAB — HM COLONOSCOPY

## 2019-06-20 ENCOUNTER — Other Ambulatory Visit: Payer: Self-pay | Admitting: Family Medicine

## 2019-06-21 ENCOUNTER — Ambulatory Visit: Payer: Medicare PPO | Attending: Internal Medicine

## 2019-06-21 DIAGNOSIS — Z23 Encounter for immunization: Secondary | ICD-10-CM

## 2019-06-21 NOTE — Progress Notes (Signed)
   Covid-19 Vaccination Clinic  Name:  Connie Morrison    MRN: [id number] DOB: 1945-12-21  06/21/2019  [name] was observed post Covid-19 immunization for 30 minutes based on pre-vaccination screening without incident. She was provided with Vaccine Information Sheet and instruction to access the V-Safe system.   [name] was instructed to call 911 with any severe reactions post vaccine: Marland Kitchen Difficulty breathing  . Swelling of face and throat  . A fast heartbeat  . A bad rash all over body  . Dizziness and weakness   Immunizations Administered    Name Date Dose VIS Date Route   Pfizer COVID-19 Vaccine 06/21/2019  9:09 AM 0.3 mL 03/10/2019 Intramuscular   Manufacturer: [employer], [employer]   Lot: [id number]   [place]: [phone]

## 2019-07-01 ENCOUNTER — Other Ambulatory Visit: Payer: Self-pay | Admitting: Family Medicine

## 2019-07-01 NOTE — Telephone Encounter (Signed)
Requested Prescriptions  Pending Prescriptions Disp Refills  . FLUoxetine (PROZAC) 10 MG capsule [Pharmacy Med Name: FLUOXETINE HCL 10 MG CAPSULE] 270 capsule 2    Sig: TAKE 3 CAPSULES BY MOUTH EVERY DAY     Psychiatry:  Antidepressants - SSRI Passed - 07/01/2019 12:33 PM      Passed - Completed PHQ-2 or PHQ-9 in the last 360 days.      Passed - Valid encounter within last 6 months    Recent Outpatient Visits          3 weeks ago Essential hypertension   [facility] Merrie Roof [place], [place]   3 months ago Essential hypertension   [place], [place], [place]   4 months ago Generalized weakness   [facility] Merrie Roof [place], [place]   6 months ago Microscopic hematuria   [facility] Merrie Roof [place], [place]   7 months ago Essential hypertension   [place], Lilia Argue, [place]      Future Appointments            In 5 months Orene Desanctis, Lilia Argue, [place], [place]

## 2019-08-04 ENCOUNTER — Telehealth: Payer: Self-pay | Admitting: Family Medicine

## 2019-08-04 NOTE — Telephone Encounter (Signed)
Routing to provider to advise.  

## 2019-08-04 NOTE — Telephone Encounter (Signed)
Unclear if a dose reduction is appropriate. Will leave for PCP return.

## 2019-08-04 NOTE — Telephone Encounter (Signed)
Copied from [place] [id number]. Topic: General - Inquiry >> Aug 04, 2019 10:54 AM Richardo Priest, NT wrote: Reason for CRM: Patient called in stating she received a letter from [facility] stating they will only cover 2x a day instead of 3x a day for the FLUoxetine (PROZAC) 10 MG capsule. Pt is wondering if it can be modified and sent to pharmacy. Please advise.

## 2019-08-07 MED ORDER — FLUOXETINE HCL 20 MG PO CAPS: 20.0000 mg | ORAL_CAPSULE | Freq: Every day | ORAL | 1 refills | Status: DC

## 2019-08-07 NOTE — Telephone Encounter (Signed)
Please send in 20mg 

## 2019-08-07 NOTE — Telephone Encounter (Signed)
20 mg capsules sent

## 2019-08-07 NOTE — Telephone Encounter (Signed)
Could do a 20 mg and a 10 mg to make the 30 mg dose or could do a 20 mg or 40 mg dose. Which would she prefer?

## 2019-08-14 ENCOUNTER — Other Ambulatory Visit: Payer: Self-pay | Admitting: Family Medicine

## 2019-12-13 LAB — HM MAMMOGRAPHY

## 2019-12-15 ENCOUNTER — Ambulatory Visit: Payer: Medicare PPO | Admitting: Family Medicine

## 2019-12-15 ENCOUNTER — Ambulatory Visit (INDEPENDENT_AMBULATORY_CARE_PROVIDER_SITE_OTHER): Payer: Medicare PPO | Admitting: Family Medicine

## 2019-12-15 ENCOUNTER — Other Ambulatory Visit: Payer: Self-pay

## 2019-12-15 ENCOUNTER — Encounter: Payer: Self-pay | Admitting: Family Medicine

## 2019-12-15 VITALS — BP 114/73 | HR 57 | Temp 98.4°F | Ht 66.0 in | Wt 137.0 lb

## 2019-12-15 DIAGNOSIS — E7800 Pure hypercholesterolemia, unspecified: Secondary | ICD-10-CM | POA: Diagnosis not present

## 2019-12-15 DIAGNOSIS — Z23 Encounter for immunization: Secondary | ICD-10-CM | POA: Diagnosis not present

## 2019-12-15 DIAGNOSIS — I10 Essential (primary) hypertension: Secondary | ICD-10-CM

## 2019-12-15 DIAGNOSIS — F325 Major depressive disorder, single episode, in full remission: Secondary | ICD-10-CM

## 2019-12-15 MED ORDER — AMLODIPINE BESYLATE 5 MG PO TABS: 5.0000 mg | ORAL_TABLET | Freq: Every day | ORAL | 1 refills | Status: DC

## 2019-12-15 MED ORDER — FLUOXETINE HCL 20 MG PO CAPS: 20.0000 mg | ORAL_CAPSULE | Freq: Every day | ORAL | 1 refills | Status: DC

## 2019-12-15 NOTE — Progress Notes (Signed)
BP 114/73 (BP Location: Right Arm, Patient Position: Sitting)   Pulse (!) 57   Temp 98.4 F (36.9 C) (Oral)   Ht 5\' 6"  (1.676 m)   Wt 137 lb (62.1 kg)   LMP 06/30/1991 (Approximate)   SpO2 98%   BMI 22.11 kg/m    Subjective:    Patient ID: Connie Morrison, female    DOB: 1945-04-27, 74 y.o.   MRN: [id number]  HPI: Connie Morrison is a 74 y.o. female  Chief Complaint  Patient presents with  . Hypertension    follow up   . Hyperlipidemia    not taking simvastatin- hasnt taken it for a year   . Depression    PHQ-0 GAD-0  . Flu Vaccine    completed   HYPERTENSION / HYPERLIPIDEMIA Satisfied with current treatment? yes Duration of hypertension: chronic BP monitoring frequency: not checking BP medication side effects: no Past BP meds: amlodipine Duration of hyperlipidemia: chronic Cholesterol medication side effects: no- but stopped the simvastatin Cholesterol supplements: [place] Past cholesterol medications: simvastatin Medication compliance: excellent compliance Aspirin: no Recent stressors: no Recurrent headaches: no Visual changes: no Palpitations: yes- just happened this AM, lasted about a minute, feels hot Dyspnea: no Chest pain: no Lower extremity edema: no Dizzy/lightheaded: no  DEPRESSION Mood status: controlled Satisfied with current treatment?: yes Symptom severity: mild  Duration of current treatment : chronic Side effects: no Medication compliance: excellent compliance Psychotherapy/counseling: no  Previous psychiatric medications: prozac Depressed mood: no Anxious mood: no Anhedonia: no Significant weight loss or gain: no Insomnia: no  Fatigue: no Feelings of worthlessness or guilt: no Impaired concentration/indecisiveness: no Suicidal ideations: no Hopelessness: no Crying spells: no  Depression screen [facility] 2/9 12/15/2019 06/08/2019 03/29/2019 12/01/2018 08/17/2017  Decreased Interest 0 0 0 0 0  Down, Depressed, Hopeless 0 0 0 0 0  PHQ - 2 Score 0  0 0 0 0  Altered sleeping 0 - 0 1 1  Tired, decreased energy 0 - 0 0 0  Change in appetite 0 - 0 0 1  Feeling bad or failure about yourself  0 - 0 0 0  Trouble concentrating 0 - 0 0 1  Moving slowly or fidgety/restless 0 - 0 0 0  Suicidal thoughts 0 - 0 0 0  PHQ-9 Score 0 - 0 1 3  Difficult doing work/chores Not difficult at all - - Not difficult at all -    Relevant past medical, surgical, family and social history reviewed and updated as indicated. Interim medical history since our last visit reviewed. Allergies and medications reviewed and updated.  Review of Systems  Constitutional: Negative.   Respiratory: Negative.   Cardiovascular: Negative.   Gastrointestinal: Negative.   Musculoskeletal: Negative.   Neurological: Negative.   Psychiatric/Behavioral: Negative.     Per HPI unless specifically indicated above     Objective:    BP 114/73 (BP Location: Right Arm, Patient Position: Sitting)   Pulse (!) 57   Temp 98.4 F (36.9 C) (Oral)   Ht 5\' 6"  (1.676 m)   Wt 137 lb (62.1 kg)   LMP 06/30/1991 (Approximate)   SpO2 98%   BMI 22.11 kg/m   Wt Readings from Last 3 Encounters:  12/15/19 137 lb (62.1 kg)  06/08/19 137 lb (62.1 kg)  06/08/19 137 lb 12.8 oz (62.5 kg)    Physical Exam Vitals and nursing note reviewed.  Constitutional:      General: She is not in acute distress.    Appearance:  Normal appearance. She is not ill-appearing, toxic-appearing or diaphoretic.  HENT:     Head: Normocephalic and atraumatic.     Right Ear: External ear normal.     Left Ear: External ear normal.     Nose: Nose normal.     Mouth/Throat:     Mouth: Mucous membranes are moist.     Pharynx: Oropharynx is clear.  Eyes:     General: No scleral icterus.       Right eye: No discharge.        Left eye: No discharge.     Extraocular Movements: Extraocular movements intact.     Conjunctiva/sclera: Conjunctivae normal.     Pupils: Pupils are equal, round, and reactive to light.    Cardiovascular:     Rate and Rhythm: Normal rate and regular rhythm.     Pulses: Normal pulses.     Heart sounds: Normal heart sounds. No murmur heard.  No friction rub. No gallop.   Pulmonary:     Effort: Pulmonary effort is normal. No respiratory distress.     Breath sounds: Normal breath sounds. No stridor. No wheezing, rhonchi or rales.  Chest:     Chest wall: No tenderness.  Musculoskeletal:        General: Normal range of motion.     Cervical back: Normal range of motion and neck supple.  Skin:    General: Skin is warm and dry.     Capillary Refill: Capillary refill takes less than 2 seconds.     Coloration: Skin is not jaundiced or pale.     Findings: No bruising, erythema, lesion or rash.  Neurological:     General: No focal deficit present.     Mental Status: She is alert and oriented to person, place, and time. Mental status is at baseline.  Psychiatric:        Mood and Affect: Mood normal.        Behavior: Behavior normal.        Thought Content: Thought content normal.        Judgment: Judgment normal.     Results for orders placed or performed in visit on 12/13/19  HM MAMMOGRAPHY  Result Value Ref Range   HM Mammogram 0-4 Bi-Rad 0-4 Bi-Rad, Self Reported Normal      Assessment & Plan:   Problem List Items Addressed This Visit      Cardiovascular and Mediastinum   Hypertension - Primary    Under good control on current regimen. Continue current regimen. Continue to monitor. Call with any concerns. Refills given. Labs drawn today.       Relevant Medications   amLODipine (NORVASC) 5 MG tablet   Other Relevant Orders   Comprehensive metabolic panel     Other   Hyperlipidemia    Rechecking labs today. Await results. Treat as needed.       Relevant Medications   amLODipine (NORVASC) 5 MG tablet   Other Relevant Orders   Comprehensive metabolic panel   Lipid Panel w/o Chol/HDL Ratio   Depression, major, single episode, complete remission (HCC)     Under good control on current regimen. Continue current regimen. Continue to monitor. Call with any concerns. Refills given. Labs drawn today.       Relevant Medications   FLUoxetine (PROZAC) 20 MG capsule   Other Relevant Orders   Comprehensive metabolic panel    Other Visit Diagnoses    Need for immunization against influenza  Flu shot given today.   Relevant Orders   Flu Vaccine QUAD High Dose(Fluad) (Completed)       Follow up plan: Return in about 6 months (around 06/13/2020) for physical.

## 2019-12-15 NOTE — Assessment & Plan Note (Addendum)
Rechecking labs today. Await results. Treat as needed.  °

## 2019-12-15 NOTE — Assessment & Plan Note (Signed)
Under good control on current regimen. Continue current regimen. Continue to monitor. Call with any concerns. Refills given. Labs drawn today.   

## 2019-12-16 LAB — COMPREHENSIVE METABOLIC PANEL
ALT: 12 IU/L (ref 0–32)
AST: 20 IU/L (ref 0–40)
Albumin/Globulin Ratio: 1.9 (ref 1.2–2.2)
Albumin: 4.1 g/dL (ref 3.7–4.7)
Alkaline Phosphatase: 56 IU/L (ref 44–121)
BUN/Creatinine Ratio: 18 (ref 12–28)
BUN: 15 mg/dL (ref 8–27)
Bilirubin Total: 0.4 mg/dL (ref 0.0–1.2)
CO2: 29 mmol/L (ref 20–29)
Calcium: 8.8 mg/dL (ref 8.7–10.3)
Chloride: 105 mmol/L (ref 96–106)
Creatinine, Ser: 0.82 mg/dL (ref 0.57–1.00)
GFR calc Af Amer: 82 mL/min/{1.73_m2} (ref >59)
GFR calc non Af Amer: 71 mL/min/{1.73_m2} (ref >59)
Globulin, Total: 2.2 g/dL (ref 1.5–4.5)
Glucose: 93 mg/dL (ref 65–99)
Potassium: 3.7 mmol/L (ref 3.5–5.2)
Sodium: 143 mmol/L (ref 134–144)
Total Protein: 6.3 g/dL (ref 6.0–8.5)

## 2019-12-16 LAB — LIPID PANEL W/O CHOL/HDL RATIO
Cholesterol, Total: 226 mg/dL — ABNORMAL HIGH (ref 100–199)
HDL: 67 mg/dL (ref >39)
LDL Chol Calc (NIH): 139 mg/dL — ABNORMAL HIGH (ref 0–99)
Triglycerides: 113 mg/dL (ref 0–149)
VLDL Cholesterol Cal: 20 mg/dL (ref 5–40)

## 2020-06-18 ENCOUNTER — Other Ambulatory Visit: Payer: Self-pay

## 2020-06-18 ENCOUNTER — Ambulatory Visit: Payer: Medicare PPO | Admitting: Family Medicine

## 2020-06-18 ENCOUNTER — Encounter: Payer: Self-pay | Admitting: Family Medicine

## 2020-06-18 VITALS — BP 122/68 | HR 71 | Temp 97.9°F | Wt 137.4 lb

## 2020-06-18 DIAGNOSIS — E7800 Pure hypercholesterolemia, unspecified: Secondary | ICD-10-CM | POA: Diagnosis not present

## 2020-06-18 DIAGNOSIS — I10 Essential (primary) hypertension: Secondary | ICD-10-CM

## 2020-06-18 DIAGNOSIS — F325 Major depressive disorder, single episode, in full remission: Secondary | ICD-10-CM | POA: Diagnosis not present

## 2020-06-18 LAB — MICROALBUMIN, URINE WAIVED
Creatinine, Urine Waived: 300 mg/dL (ref 10–300)
Microalb, Ur Waived: 150 mg/L — ABNORMAL HIGH (ref 0–19)

## 2020-06-18 MED ORDER — FLUOXETINE HCL 20 MG PO CAPS: 20.0000 mg | ORAL_CAPSULE | Freq: Every day | ORAL | 1 refills | Status: DC

## 2020-06-18 MED ORDER — SIMVASTATIN 20 MG PO TABS: 20.0000 mg | ORAL_TABLET | Freq: Every day | ORAL | 1 refills | Status: DC

## 2020-06-18 MED ORDER — AMLODIPINE BESYLATE 5 MG PO TABS: 5.0000 mg | ORAL_TABLET | Freq: Every day | ORAL | 1 refills | Status: DC

## 2020-06-18 MED ORDER — HYDROCHLOROTHIAZIDE 25 MG PO TABS: 12.5000 mg | ORAL_TABLET | ORAL | Status: DC | PRN

## 2020-06-18 NOTE — Progress Notes (Signed)
BP 122/68   Pulse 71   Temp 97.9 F (36.6 C)   Wt 137 lb 7.2 oz (62.3 kg)   LMP 06/30/1991 (Approximate)   SpO2 100%   BMI 22.19 kg/m    Subjective:    Patient ID: Connie Morrison, female    DOB: [birth year], 75 y.o.   MRN: [id number]  HPI: Connie Morrison is a 75 y.o. female  Chief Complaint  Patient presents with  . Hypertension  . Hyperlipidemia  . Depression   HYPERTENSION / HYPERLIPIDEMIA Satisfied with current treatment? yes Duration of hypertension: chronic BP monitoring frequency: not checking BP medication side effects: no Past BP meds: amlodipine Duration of hyperlipidemia: chronic Cholesterol medication side effects: no Cholesterol supplements: none Past cholesterol medications: simvastatin Medication compliance: excellent compliance Aspirin: yes Recent stressors: no Recurrent headaches: no Visual changes: no Palpitations: no Dyspnea: no Chest pain: no Lower extremity edema: no Dizzy/lightheaded: no  DEPRESSION Mood status: controlled Satisfied with current treatment?: yes Symptom severity: mild  Duration of current treatment : chronic Side effects: no Medication compliance: excellent compliance Psychotherapy/counseling: no  Previous psychiatric medications: prozac Depressed mood: no Anxious mood: no Anhedonia: no Significant weight loss or gain: no Insomnia: no  Fatigue: no Feelings of worthlessness or guilt: no Impaired concentration/indecisiveness: no Suicidal ideations: no Hopelessness: no Crying spells: no Depression screen [facility] 2/9 06/18/2020 12/15/2019 06/08/2019 03/29/2019 12/01/2018  Decreased Interest 0 0 0 0 0  Down, Depressed, Hopeless 0 0 0 0 0  PHQ - 2 Score 0 0 0 0 0  Altered sleeping 0 0 - 0 1  Tired, decreased energy 0 0 - 0 0  Change in appetite 0 0 - 0 0  Feeling bad or failure about yourself  0 0 - 0 0  Trouble concentrating 0 0 - 0 0  Moving slowly or fidgety/restless 0 0 - 0 0  Suicidal thoughts 0 0 - 0 0  PHQ-9 Score  0 0 - 0 1  Difficult doing work/chores - Not difficult at all - - Not difficult at all    Relevant past medical, surgical, family and social history reviewed and updated as indicated. Interim medical history since our last visit reviewed. Allergies and medications reviewed and updated.  Review of Systems  Constitutional: Negative.   Respiratory: Negative.   Cardiovascular: Negative.   Gastrointestinal: Negative.   Musculoskeletal: Negative.   Psychiatric/Behavioral: Negative.     Per HPI unless specifically indicated above     Objective:    BP 122/68   Pulse 71   Temp 97.9 F (36.6 C)   Wt 137 lb 7.2 oz (62.3 kg)   LMP 06/30/1991 (Approximate)   SpO2 100%   BMI 22.19 kg/m   Wt Readings from Last 3 Encounters:  06/18/20 137 lb 7.2 oz (62.3 kg)  12/15/19 137 lb (62.1 kg)  06/08/19 137 lb (62.1 kg)    Physical Exam Vitals and nursing note reviewed.  Constitutional:      General: She is not in acute distress.    Appearance: Normal appearance. She is not ill-appearing, toxic-appearing or diaphoretic.  HENT:     Head: Normocephalic and atraumatic.     Right Ear: External ear normal.     Left Ear: External ear normal.     Nose: Nose normal.     Mouth/Throat:     Mouth: Mucous membranes are moist.     Pharynx: Oropharynx is clear.  Eyes:     General: No scleral icterus.  Right eye: No discharge.        Left eye: No discharge.     Extraocular Movements: Extraocular movements intact.     Conjunctiva/sclera: Conjunctivae normal.     Pupils: Pupils are equal, round, and reactive to light.  Cardiovascular:     Rate and Rhythm: Normal rate and regular rhythm.     Pulses: Normal pulses.     Heart sounds: Normal heart sounds. No murmur heard. No friction rub. No gallop.   Pulmonary:     Effort: Pulmonary effort is normal. No respiratory distress.     Breath sounds: Normal breath sounds. No stridor. No wheezing, rhonchi or rales.  Chest:     Chest wall: No  tenderness.  Musculoskeletal:        General: Normal range of motion.     Cervical back: Normal range of motion and neck supple.  Skin:    General: Skin is warm and dry.     Capillary Refill: Capillary refill takes less than 2 seconds.     Coloration: Skin is not jaundiced or pale.     Findings: No bruising, erythema, lesion or rash.  Neurological:     General: No focal deficit present.     Mental Status: She is alert and oriented to person, place, and time. Mental status is at baseline.  Psychiatric:        Mood and Affect: Mood normal.        Behavior: Behavior normal.        Thought Content: Thought content normal.        Judgment: Judgment normal.     Results for orders placed or performed in visit on 06/18/20  HM COLONOSCOPY  Result Value Ref Range   HM Colonoscopy See Report (in chart) See Report (in chart), Patient Reported      Assessment & Plan:   Problem List Items Addressed This Visit      Cardiovascular and Mediastinum   Hypertension    Under good control on current regimen. Continue current regimen. Continue to monitor. Call with any concerns. Refills given. Labs drawn today.        Relevant Medications   amLODipine (NORVASC) 5 MG tablet   hydrochlorothiazide (HYDRODIURIL) 25 MG tablet   simvastatin (ZOCOR) 20 MG tablet     Other   Hyperlipidemia    Under good control on current regimen. Continue current regimen. Continue to monitor. Call with any concerns. Refills given. Labs drawn today.       Relevant Medications   amLODipine (NORVASC) 5 MG tablet   hydrochlorothiazide (HYDRODIURIL) 25 MG tablet   simvastatin (ZOCOR) 20 MG tablet   Other Relevant Orders   Comprehensive metabolic panel   Lipid Panel w/o Chol/HDL Ratio   Depression, major, single episode, complete remission ([place]) - Primary    Under good control on current regimen. Continue current regimen. Continue to monitor. Call with any concerns. Refills given. Labs drawn today.        Relevant Medications   FLUoxetine (PROZAC) 20 MG capsule   Other Relevant Orders   Comprehensive metabolic panel    Other Visit Diagnoses    Essential hypertension       Relevant Medications   amLODipine (NORVASC) 5 MG tablet   hydrochlorothiazide (HYDRODIURIL) 25 MG tablet   simvastatin (ZOCOR) 20 MG tablet   Other Relevant Orders   Comprehensive metabolic panel   Microalbumin, Urine Waived       Follow up plan: Return in about 6 months (  around 12/19/2020) for physical.

## 2020-06-18 NOTE — Assessment & Plan Note (Signed)
Under good control on current regimen. Continue current regimen. Continue to monitor. Call with any concerns. Refills given. Labs drawn today.   

## 2020-06-19 ENCOUNTER — Ambulatory Visit: Payer: Self-pay | Admitting: *Deleted

## 2020-06-19 LAB — COMPREHENSIVE METABOLIC PANEL
ALT: 13 IU/L (ref 0–32)
AST: 23 IU/L (ref 0–40)
Albumin/Globulin Ratio: 1.6 (ref 1.2–2.2)
Albumin: 4.2 g/dL (ref 3.7–4.7)
Alkaline Phosphatase: 67 IU/L (ref 44–121)
BUN/Creatinine Ratio: 18 (ref 12–28)
BUN: 15 mg/dL (ref 8–27)
Bilirubin Total: 0.4 mg/dL (ref 0.0–1.2)
CO2: 25 mmol/L (ref 20–29)
Calcium: 9.2 mg/dL (ref 8.7–10.3)
Chloride: 103 mmol/L (ref 96–106)
Creatinine, Ser: 0.85 mg/dL (ref 0.57–1.00)
Globulin, Total: 2.6 g/dL (ref 1.5–4.5)
Glucose: 92 mg/dL (ref 65–99)
Potassium: 3.8 mmol/L (ref 3.5–5.2)
Sodium: 143 mmol/L (ref 134–144)
Total Protein: 6.8 g/dL (ref 6.0–8.5)
eGFR: 72 mL/min/{1.73_m2} (ref >59)

## 2020-06-19 LAB — LIPID PANEL W/O CHOL/HDL RATIO
Cholesterol, Total: 221 mg/dL — ABNORMAL HIGH (ref 100–199)
HDL: 77 mg/dL (ref >39)
LDL Chol Calc (NIH): 125 mg/dL — ABNORMAL HIGH (ref 0–99)
Triglycerides: 112 mg/dL (ref 0–149)
VLDL Cholesterol Cal: 19 mg/dL (ref 5–40)

## 2020-06-19 NOTE — Telephone Encounter (Signed)
Please reach out to patient.

## 2020-06-19 NOTE — Telephone Encounter (Signed)
Patient called to and confused by lab results. Called patient back , no answer, left voicemail to call clinic back.

## 2020-06-19 NOTE — Telephone Encounter (Signed)
  Reason for Disposition . Caller requesting lab results  Answer Assessment - Initial Assessment Questions 1. REASON FOR CALL or QUESTION: "What is your reason for calling today?" or "How can I best help you?" or "What question do you have that I can help answer?"     Patient has seen her labs drawn after office visit yesterday and she has questions about her abnormal results. Patient advised her provider has not reviewed and made comments/recommendations/next steps advise- will alert office she is concerned and would like call back.  Protocols used: PCP CALL - NO TRIAGE-A-AH, INFORMATION ONLY CALL - NO TRIAGE-A-AH

## 2020-06-21 ENCOUNTER — Encounter: Payer: Self-pay | Admitting: Family Medicine

## 2020-06-24 ENCOUNTER — Ambulatory Visit: Payer: Self-pay | Admitting: *Deleted

## 2020-06-24 NOTE — Telephone Encounter (Signed)
Not able to answer pt's question regarding the micro albumin result.   Agent is transferring the call into the practice for an explanation.

## 2020-09-04 ENCOUNTER — Telehealth: Payer: Self-pay | Admitting: Nurse Practitioner

## 2020-09-04 NOTE — Telephone Encounter (Signed)
Copied from [place] [id number]. Topic: Medicare AWV >> Sep 04, 2020 10:32 AM Cher Nakai R wrote: Reason for CRM:  Left message for patient to call back and schedule the Medicare Annual Wellness Visit (AWV) virtually or by telephone.  Last AWV 06/08/2019  Please schedule at anytime with [facility] Health Advisor.  45 minute appointment  Any questions, please call me at [phone]

## 2020-09-13 ENCOUNTER — Ambulatory Visit (INDEPENDENT_AMBULATORY_CARE_PROVIDER_SITE_OTHER): Payer: Medicare PPO

## 2020-09-13 VITALS — Ht 67.0 in | Wt 137.0 lb

## 2020-09-13 DIAGNOSIS — Z0000 Encounter for general adult medical examination without abnormal findings: Secondary | ICD-10-CM | POA: Diagnosis not present

## 2020-09-13 NOTE — Progress Notes (Signed)
I connected with Connie Morrison today by telephone and verified that I am speaking with the correct person using two identifiers. Location patient: home Location provider: work Persons participating in the virtual visit: Connie Morrison, Glenna Durand LPN.   I discussed the limitations, risks, security and privacy concerns of performing an evaluation and management service by telephone and the availability of in person appointments. I also discussed with the patient that there may be a patient responsible charge related to this service. The patient expressed understanding and verbally consented to this telephonic visit.    Interactive audio and video telecommunications were attempted between this provider and patient, however failed, due to patient having technical difficulties OR patient did not have access to video capability.  We continued and completed visit with audio only.     Vital signs may be patient reported or missing.  Subjective:   Connie Morrison is a 75 y.o. female who presents for Medicare Annual (Subsequent) preventive examination.  Review of Systems     Cardiac Risk Factors include: advanced age (>32men, >63 women);dyslipidemia;hypertension     Objective:    Today's Vitals   09/13/20 1341  Weight: 137 lb (62.1 kg)  Height: 5\' 7"  (1.702 m)   Body mass index is 21.46 kg/m.  Advanced Directives 09/13/2020 06/08/2019 11/23/2018 11/02/2018 11/02/2017 07/24/2016 07/19/2015  Does Patient Have a Medical Advance Directive? Yes Yes Yes Yes Yes Yes Yes  Type of Paramedic of [place];Living will Living will;Healthcare Power of [place];Living will Connie Morrison;Living will [place];Living will [place];Living will [place];Living will  Does patient want to make changes to medical advance directive? - - No - Patient declined No - Patient declined - - -  Copy of  [place] in Chart? No - copy requested No - copy requested Yes - validated most recent copy scanned in chart (See row information) No - copy requested - Yes -    Current Medications (verified) Outpatient Encounter Medications as of 09/13/2020  Medication Sig   amLODipine (NORVASC) 5 MG tablet Take 1 tablet (5 mg total) by mouth daily.   diphenhydramine-acetaminophen (TYLENOL PM) 25-500 MG TABS tablet Take 0.5 tablets by mouth at bedtime as needed.   FLUoxetine (PROZAC) 20 MG capsule Take 1 capsule (20 mg total) by mouth daily.   hydrochlorothiazide (HYDRODIURIL) 25 MG tablet Take 0.5 tablets (12.5 mg total) by mouth as needed.   Multiple Vitamin (MULTIVITAMIN) tablet Take 1 tablet by mouth daily.   simvastatin (ZOCOR) 20 MG tablet Take 1 tablet (20 mg total) by mouth daily.   tretinoin (RETIN-A) 0.1 % cream APPLY PEA SIZE AMOUNT TO ENTIRE DRY FACE NIGHTLY   mupirocin ointment (BACTROBAN) 2 % Place 1 application into the nose 2 (two) times daily as needed. (Patient not taking: No sig reported)   No facility-administered encounter medications on file as of 09/13/2020.    Allergies (verified) Cymbalta [duloxetine hcl] and Sulfa antibiotics   History: Past Medical History:  Diagnosis Date   Allergic rhinitis    Anginal pain (HCC)    Anxiety    Complication of anesthesia    itching after anesthesia (50 yrs ago)   Depression    Dyspnea    (pt denies)   GERD (gastroesophageal reflux disease)    Hematuria    Hyperlipidemia    Hypertension    (pt denies)   Menopause    Microscopic hematuria  Vaginal atrophy    Wears dentures    lower partial   Past Surgical History:  Procedure Laterality Date   BREAST SURGERY     cyst removed   CATARACT EXTRACTION W/PHACO Left 11/02/2018   Procedure: CATARACT EXTRACTION PHACO AND INTRAOCULAR LENS PLACEMENT ([place])  LEFT;  Surgeon: Leandrew Koyanagi, MD;  Location: [facility];  Service: Ophthalmology;  Laterality:  Left;   CATARACT EXTRACTION W/PHACO Right 11/23/2018   Procedure: CATARACT EXTRACTION PHACO AND INTRAOCULAR LENS PLACEMENT ([place]) - right  00:50.6  17.1;  Surgeon: Leandrew Koyanagi, MD;  Location: [facility];  Service: Ophthalmology;  Laterality: Right;   COLONOSCOPY     COLONOSCOPY WITH PROPOFOL N/A 11/02/2017   Procedure: COLONOSCOPY WITH PROPOFOL;  Surgeon: Lollie Sails, MD;  Location: [facility] ENDOSCOPY;  Service: Endoscopy;  Laterality: N/A;   TONSILLECTOMY     TUMOR EXCISION Right    kidney   Family History  Problem Relation Age of Onset   Heart disease Father    Cancer Sister        lung   Cancer Brother        colon   Diabetes Brother    Stroke Maternal Grandmother    Cancer Maternal Grandfather        lung   Cancer Sister        lung   COPD Sister    Heart disease Brother    Social History   Socioeconomic History   Marital status: Married    Spouse name: Not on file   Number of children: Not on file   Years of education: Not on file   Highest education level: Not on file  Occupational History   Occupation: retired   Tobacco Use   Smoking status: Former    Pack years: 0.00    Types: Cigarettes    Quit date: 1980    Years since quitting: 42.4   Smokeless tobacco: Never  Vaping Use   Vaping Use: Never used  Substance and Sexual Activity   Alcohol use: No    Alcohol/week: 0.0 standard drinks   Drug use: No   Sexual activity: Yes  Other Topics Concern   Not on file  Social History Narrative   Not on file   Social Determinants of Health   Financial Resource Strain: Low Risk    Difficulty of Paying Living Expenses: Not hard at all  Food Insecurity: No Food Insecurity   Worried About Charity fundraiser in the Last Year: Never true   [place] in the Last Year: Never true  Transportation Needs: No Transportation Needs   Lack of Transportation (Medical): No   Lack of Transportation (Non-Medical): No  Physical Activity: Sufficiently  Active   Days of Exercise per Week: 4 days   Minutes of Exercise per Session: 60 min  Stress: No Stress Concern Present   Feeling of Stress : Not at all  Social Connections: Not on file    Tobacco Counseling Counseling given: Not Answered   Clinical Intake:  Pre-visit preparation completed: Yes  Pain : No/denies pain     Nutritional Status: BMI of 19-24  Normal Nutritional Risks: None Diabetes: No  How often do you need to have someone help you when you read instructions, pamphlets, or other written materials from your doctor or pharmacy?: 1 - Never What is the last grade level you completed in school?: 12th grade  Diabetic? no  Interpreter Needed?: No  Information entered by ::  NAllen LPN   Activities of Daily Living In your present state of health, do you have any difficulty performing the following activities: 09/13/2020 06/18/2020  Hearing? N N  Vision? N N  Difficulty concentrating or making decisions? N N  Walking or climbing stairs? N N  Dressing or bathing? N N  Doing errands, shopping? N N  Preparing Food and eating ? N -  Using the Toilet? N -  In the past six months, have you accidently leaked urine? N -  Do you have problems with loss of bowel control? N -  Managing your Medications? N -  Managing your Finances? N -  Housekeeping or managing your Housekeeping? N -  Some recent data might be hidden    Patient Care Team: Jon Billings, NP as PCP - General  Indicate any recent Medical Services you may have received from other than [facility] providers in the past year (date may be approximate).     Assessment:   This is a routine wellness examination for Connie Morrison.  Hearing/Vision screen No results found.  Dietary issues and exercise activities discussed: Current Exercise Habits: Structured exercise class, Type of exercise: calisthenics, Time (Minutes): 60, Frequency (Times/Week): 4, Weekly Exercise (Minutes/Week): 240   Goals Addressed              This Visit's Progress    Patient Stated       09/13/2020, not to gain weight        Depression Screen PHQ 2/9 Scores 09/13/2020 06/18/2020 12/15/2019 06/08/2019 03/29/2019 12/01/2018 08/17/2017  PHQ - 2 Score 0 0 0 0 0 0 0  PHQ- 9 Score - 0 0 - 0 1 3    Fall Risk Fall Risk  09/13/2020 06/18/2020 06/08/2019 03/29/2019 08/17/2017  Falls in the past year? 0 0 0 0 No  Number falls in past yr: - 0 0 0 -  Injury with Fall? - 0 0 0 -  Risk for fall due to : Medication side effect No Fall Risks - - -  Follow up Falls evaluation completed;Education provided;Falls prevention discussed Falls evaluation completed - - -    FALL RISK PREVENTION PERTAINING TO THE HOME:  Any stairs in or around the home? No  If so, are there any without handrails? N/a Home free of loose throw rugs in walkways, pet beds, electrical cords, etc? Yes  Adequate lighting in your home to reduce risk of falls? Yes   ASSISTIVE DEVICES UTILIZED TO PREVENT FALLS:  Life alert? No  Use of a cane, walker or w/c? No  Grab bars in the bathroom? Yes  Shower chair or bench in shower? Yes  Elevated toilet seat or a handicapped toilet? Yes   TIMED UP AND GO:  Was the test performed? No .      Cognitive Function:     6CIT Screen 09/13/2020  What Year? 0 points  What month? 0 points  What time? 0 points  Count back from 20 0 points  Months in reverse 0 points  Repeat phrase 2 points  Total Score 2    Immunizations Immunization History  Administered Date(s) Administered   Fluad Quad(high Dose 65+) 12/01/2018, 12/15/2019   Influenza, High Dose Seasonal PF 12/27/2015, 01/10/2018   Influenza-Unspecified 12/23/2012, 12/27/2013   PFIZER(Purple Top)SARS-COV-2 Vaccination 05/25/2019, 06/21/2019   Pneumococcal Conjugate-13 07/13/2014   Pneumococcal Polysaccharide-23 03/15/2013   Td 12/03/2004   Zoster Recombinat (Shingrix) 06/16/2018   Zoster, Live 02/05/2011    TDAP status: Due, Education has been provided  regarding the importance of this vaccine. Advised may receive this vaccine at local pharmacy or Health Dept. Aware to provide a copy of the vaccination record if obtained from local pharmacy or Health Dept. Verbalized acceptance and understanding.  Flu Vaccine status: Up to date  Pneumococcal vaccine status: Up to date  Covid-19 vaccine status: Completed vaccines  Qualifies for Shingles Vaccine? Yes   Zostavax completed Yes   Shingrix Completed?: Yes  Screening Tests Health Maintenance  Topic Date Due   TETANUS/TDAP  12/04/2014   Zoster Vaccines- Shingrix (2 of 2) 08/11/2018   COVID-19 Vaccine (3 - Booster for Pfizer series) 11/21/2019   INFLUENZA VACCINE  10/28/2020   MAMMOGRAM  12/12/2020   COLONOSCOPY (Pts 45-60yrs Insurance coverage will need to be confirmed)  06/18/2029   DEXA SCAN  Completed   Hepatitis C Screening  Completed   PNA vac Low Risk Adult  Completed   HPV VACCINES  Aged Out    Health Maintenance  Health Maintenance Due  Topic Date Due   TETANUS/TDAP  12/04/2014   Zoster Vaccines- Shingrix (2 of 2) 08/11/2018   COVID-19 Vaccine (3 - Booster for Pfizer series) 11/21/2019    Colorectal cancer screening: No longer required.   Mammogram status: Completed 12/13/2019. Repeat every year  Bone Density status: Completed 06/04/2016.   Lung Cancer Screening: (Low Dose CT Chest recommended if Age 76-80 years, 30 pack-year currently smoking OR have quit w/in 15years.) does not qualify.   Lung Cancer Screening Referral: no  Additional Screening:  Hepatitis C Screening: does qualify; Completed 07/19/2015  Vision Screening: Recommended annual ophthalmology exams for early detection of glaucoma and other disorders of the eye. Is the patient up to date with their annual eye exam?  Yes  Who is the provider or what is the name of the office in which the patient attends annual eye exams? [facility] If pt is not established with a provider, would they like to be  referred to a provider to establish care? No .   Dental Screening: Recommended annual dental exams for proper oral hygiene  Community Resource Referral / Chronic Care Management: CRR required this visit?  No   CCM required this visit?  No      Plan:     I have personally reviewed and noted the following in the patient's chart:   Medical and social history Use of alcohol, tobacco or illicit drugs  Current medications and supplements including opioid prescriptions.  Functional ability and status Nutritional status Physical activity Advanced directives List of other physicians Hospitalizations, surgeries, and ER visits in previous 12 months Vitals Screenings to include cognitive, depression, and falls Referrals and appointments  In addition, I have reviewed and discussed with patient certain preventive protocols, quality metrics, and best practice recommendations. A written personalized care plan for preventive services as well as general preventive health recommendations were provided to patient.     Kellie Simmering, LPN   07/15/4079   Nurse Notes:

## 2020-09-13 NOTE — Patient Instructions (Signed)
Connie Morrison , Thank you for taking time to come for your Medicare Wellness Visit. I appreciate your ongoing commitment to your health goals. Please review the following plan we discussed and let me know if I can assist you in the future.   Screening recommendations/referrals: Colonoscopy: not required Mammogram: completed 12/13/2019 Bone Density: completed 06/04/2016 Recommended yearly ophthalmology/optometry visit for glaucoma screening and checkup Recommended yearly dental visit for hygiene and checkup  Vaccinations: Influenza vaccine: completed 12/15/2019, due 10/28/2020 Pneumococcal vaccine: completed 07/13/2014 Tdap vaccine: decline Shingles vaccine: completed   Covid-19:06/21/2019, 05/25/2019  Advanced directives: Please bring a copy of your POA (Power of [place]) and/or Living Will to your next appointment.   Conditions/risks identified: none  Next appointment: Follow up in one year for your annual wellness visit    Preventive Care 65 Years and Older, Female Preventive care refers to lifestyle choices and visits with your health care provider that can promote health and wellness. What does preventive care include? A yearly physical exam. This is also called an annual well check. Dental exams once or twice a year. Routine eye exams. Ask your health care provider how often you should have your eyes checked. Personal lifestyle choices, including: Daily care of your teeth and gums. Regular physical activity. Eating a healthy diet. Avoiding tobacco and drug use. Limiting alcohol use. Practicing safe sex. Taking low-dose aspirin every day. Taking vitamin and mineral supplements as recommended by your health care provider. What happens during an annual well check? The services and screenings done by your health care provider during your annual well check will depend on your age, overall health, lifestyle risk factors, and family history of disease. Counseling  Your health care provider  may ask you questions about your: Alcohol use. Tobacco use. Drug use. Emotional well-being. Home and relationship well-being. Sexual activity. Eating habits. History of falls. Memory and ability to understand (cognition). Work and work Statistician. Reproductive health. Screening  You may have the following tests or measurements: Height, weight, and BMI. Blood pressure. Lipid and cholesterol levels. These may be checked every 5 years, or more frequently if you are over 55 years old. Skin check. Lung cancer screening. You may have this screening every year starting at age 3 if you have a 30-pack-year history of smoking and currently smoke or have quit within the past 15 years. Fecal occult blood test (FOBT) of the stool. You may have this test every year starting at age 69. Flexible sigmoidoscopy or colonoscopy. You may have a sigmoidoscopy every 5 years or a colonoscopy every 10 years starting at age 102. Hepatitis C blood test. Hepatitis B blood test. Sexually transmitted disease (STD) testing. Diabetes screening. This is done by checking your blood sugar (glucose) after you have not eaten for a while (fasting). You may have this done every 1-3 years. Bone density scan. This is done to screen for osteoporosis. You may have this done starting at age 29. Mammogram. This may be done every 1-2 years. Talk to your health care provider about how often you should have regular mammograms. Talk with your health care provider about your test results, treatment options, and if necessary, the need for more tests. Vaccines  Your health care provider may recommend certain vaccines, such as: Influenza vaccine. This is recommended every year. Tetanus, diphtheria, and acellular pertussis (Tdap, Td) vaccine. You may need a Td booster every 10 years. Zoster vaccine. You may need this after age 70. Pneumococcal 13-valent conjugate (PCV13) vaccine. One dose is recommended after  age 20. Pneumococcal  polysaccharide (PPSV23) vaccine. One dose is recommended after age 7. Talk to your health care provider about which screenings and vaccines you need and how often you need them. This information is not intended to replace advice given to you by your health care provider. Make sure you discuss any questions you have with your health care provider. Document Released: 04/12/2015 Document Revised: 12/04/2015 Document Reviewed: 01/15/2015 Elsevier Interactive Patient Education  2017 [employer] Prevention in the Home Falls can cause injuries. They can happen to people of all ages. There are many things you can do to make your home safe and to help prevent falls. What can I do on the outside of my home? Regularly fix the edges of walkways and driveways and fix any cracks. Remove anything that might make you trip as you walk through a door, such as a raised step or threshold. Trim any bushes or trees on the path to your home. Use bright outdoor lighting. Clear any walking paths of anything that might make someone trip, such as rocks or tools. Regularly check to see if handrails are loose or broken. Make sure that both sides of any steps have handrails. Any raised decks and porches should have guardrails on the edges. Have any leaves, snow, or ice cleared regularly. Use sand or salt on walking paths during winter. Clean up any spills in your garage right away. This includes oil or grease spills. What can I do in the bathroom? Use night lights. Install grab bars by the toilet and in the tub and shower. Do not use towel bars as grab bars. Use non-skid mats or decals in the tub or shower. If you need to sit down in the shower, use a plastic, non-slip stool. Keep the floor dry. Clean up any water that spills on the floor as soon as it happens. Remove soap buildup in the tub or shower regularly. Attach bath mats securely with double-sided non-slip rug tape. Do not have throw rugs and other  things on the floor that can make you trip. What can I do in the bedroom? Use night lights. Make sure that you have a light by your bed that is easy to reach. Do not use any sheets or blankets that are too big for your bed. They should not hang down onto the floor. Have a firm chair that has side arms. You can use this for support while you get dressed. Do not have throw rugs and other things on the floor that can make you trip. What can I do in the kitchen? Clean up any spills right away. Avoid walking on wet floors. Keep items that you use a lot in easy-to-reach places. If you need to reach something above you, use a strong step stool that has a grab bar. Keep electrical cords out of the way. Do not use floor polish or wax that makes floors slippery. If you must use wax, use non-skid floor wax. Do not have throw rugs and other things on the floor that can make you trip. What can I do with my stairs? Do not leave any items on the stairs. Make sure that there are handrails on both sides of the stairs and use them. Fix handrails that are broken or loose. Make sure that handrails are as long as the stairways. Check any carpeting to make sure that it is firmly attached to the stairs. Fix any carpet that is loose or worn. Avoid having throw rugs  at the top or bottom of the stairs. If you do have throw rugs, attach them to the floor with carpet tape. Make sure that you have a light switch at the top of the stairs and the bottom of the stairs. If you do not have them, ask someone to add them for you. What else can I do to help prevent falls? Wear shoes that: Do not have high heels. Have rubber bottoms. Are comfortable and fit you well. Are closed at the toe. Do not wear sandals. If you use a stepladder: Make sure that it is fully opened. Do not climb a closed stepladder. Make sure that both sides of the stepladder are locked into place. Ask someone to hold it for you, if possible. Clearly  mark and make sure that you can see: Any grab bars or handrails. First and last steps. Where the edge of each step is. Use tools that help you move around (mobility aids) if they are needed. These include: Canes. Walkers. Scooters. Crutches. Turn on the lights when you go into a dark area. Replace any light bulbs as soon as they burn out. Set up your furniture so you have a clear path. Avoid moving your furniture around. If any of your floors are uneven, fix them. If there are any pets around you, be aware of where they are. Review your medicines with your doctor. Some medicines can make you feel dizzy. This can increase your chance of falling. Ask your doctor what other things that you can do to help prevent falls. This information is not intended to replace advice given to you by your health care provider. Make sure you discuss any questions you have with your health care provider. Document Released: 01/10/2009 Document Revised: 08/22/2015 Document Reviewed: 04/20/2014 Elsevier Interactive Patient Education  2017 [employer].

## 2020-12-26 NOTE — Progress Notes (Signed)
BP 120/69   Pulse 62   Temp 97.8 F (36.6 C) (Oral)   Ht 5' 6.54" (1.69 m)   Wt 137 lb 6.4 oz (62.3 kg)   LMP 06/30/1991 (Approximate)   SpO2 99%   BMI 21.82 kg/m    Subjective:    Patient ID: Connie Morrison, female    DOB: Dec 04, 1945, 75 y.o.   MRN: [id number]  HPI: Connie Morrison is a 75 y.o. female presenting on 12/27/2020 for comprehensive medical examination. Current medical complaints include:none  She currently lives with: Menopausal Symptoms: no  HYPERTENSION / HYPERLIPIDEMIA Satisfied with current treatment? no Duration of hypertension: years BP monitoring frequency: not checking BP range:  BP medication side effects: no Past BP meds: amlodipine Duration of hyperlipidemia: years Cholesterol medication side effects: no Cholesterol supplements: none Past cholesterol medications: rosuvastatin (crestor) Medication compliance:  excellent Aspirin: no Recent stressors: no Recurrent headaches: no Visual changes: no Palpitations: no Dyspnea: no Chest pain: no Lower extremity edema: no Dizzy/lightheaded: no  ANXIETY/DEPRESSION Well controlled.  Patient denies concerns at visit today.  Denies SI.  Feels like Prozac is working well for her.  Depression Screen done today and results listed below:  Depression screen [facility] 2/9 12/27/2020 09/13/2020 06/18/2020 12/15/2019 06/08/2019  Decreased Interest 0 0 0 0 0  Down, Depressed, Hopeless 0 0 0 0 0  PHQ - 2 Score 0 0 0 0 0  Altered sleeping 0 - 0 0 -  Tired, decreased energy 0 - 0 0 -  Change in appetite 0 - 0 0 -  Feeling bad or failure about yourself  0 - 0 0 -  Trouble concentrating 0 - 0 0 -  Moving slowly or fidgety/restless - - 0 0 -  Suicidal thoughts 0 - 0 0 -  PHQ-9 Score 0 - 0 0 -  Difficult doing work/chores Not difficult at all - - Not difficult at all -  Some recent data might be hidden    The patient does not have a history of falls. I did complete a risk assessment for falls. A plan of care for falls was  documented.   Past Medical History:  Past Medical History:  Diagnosis Date   Allergic rhinitis    Anginal pain (HCC)    Anxiety    Complication of anesthesia    itching after anesthesia (50 yrs ago)   Depression    Dyspnea    (pt denies)   GERD (gastroesophageal reflux disease)    Hematuria    Hyperlipidemia    Hypertension    (pt denies)   Menopause    Microscopic hematuria    Vaginal atrophy    Wears dentures    lower partial    Surgical History:  Past Surgical History:  Procedure Laterality Date   BREAST SURGERY     cyst removed   CATARACT EXTRACTION W/PHACO Left 11/02/2018   Procedure: CATARACT EXTRACTION PHACO AND INTRAOCULAR LENS PLACEMENT ([place])  LEFT;  Surgeon: Leandrew Koyanagi, MD;  Location: [place];  Service: Ophthalmology;  Laterality: Left;   CATARACT EXTRACTION W/PHACO Right 11/23/2018   Procedure: CATARACT EXTRACTION PHACO AND INTRAOCULAR LENS PLACEMENT ([place]) - right  00:50.6  17.1;  Surgeon: Leandrew Koyanagi, MD;  Location: [place];  Service: Ophthalmology;  Laterality: Right;   COLONOSCOPY     COLONOSCOPY WITH PROPOFOL N/A 11/02/2017   Procedure: COLONOSCOPY WITH PROPOFOL;  Surgeon: Lollie Sails, MD;  Location: [facility] ENDOSCOPY;  Service: Endoscopy;  Laterality: N/A;  TONSILLECTOMY     TUMOR EXCISION Right    kidney    Medications:  Current Outpatient Medications on File Prior to Visit  Medication Sig   diphenhydramine-acetaminophen (TYLENOL PM) 25-500 MG TABS tablet Take 0.5 tablets by mouth at bedtime as needed.   hydrochlorothiazide (HYDRODIURIL) 25 MG tablet Take 0.5 tablets (12.5 mg total) by mouth as needed.   Multiple Vitamin (MULTIVITAMIN) tablet Take 1 tablet by mouth daily.   mupirocin ointment (BACTROBAN) 2 % Place 1 application into the nose 2 (two) times daily as needed.   tretinoin (RETIN-A) 0.1 % cream APPLY PEA SIZE AMOUNT TO ENTIRE DRY FACE NIGHTLY   No current facility-administered medications on  file prior to visit.    Allergies:  Allergies  Allergen Reactions   Cymbalta [Duloxetine Hcl] Other (See Comments)    constipation   Sulfa Antibiotics Rash    Social History:  Social History   Socioeconomic History   Marital status: Married    Spouse name: Not on file   Number of children: Not on file   Years of education: Not on file   Highest education level: Not on file  Occupational History   Occupation: retired   Tobacco Use   Smoking status: Former    Types: Cigarettes    Quit date: 1980    Years since quitting: 42.7   Smokeless tobacco: Never  Vaping Use   Vaping Use: Never used  Substance and Sexual Activity   Alcohol use: No    Alcohol/week: 0.0 standard drinks   Drug use: No   Sexual activity: Yes  Other Topics Concern   Not on file  Social History Narrative   Not on file   Social Determinants of Health   Financial Resource Strain: Low Risk    Difficulty of Paying Living Expenses: Not hard at all  Food Insecurity: No Food Insecurity   Worried About Charity fundraiser in the Last Year: Never true   [place] in the Last Year: Never true  Transportation Needs: No Transportation Needs   Lack of Transportation (Medical): No   Lack of Transportation (Non-Medical): No  Physical Activity: Sufficiently Active   Days of Exercise per Week: 4 days   Minutes of Exercise per Session: 60 min  Stress: No Stress Concern Present   Feeling of Stress : Not at all  Social Connections: Not on file  Intimate Partner Violence: Not on file   Social History   Tobacco Use  Smoking Status Former   Types: Cigarettes   Quit date: 1980   Years since quitting: 42.7  Smokeless Tobacco Never   Social History   Substance and Sexual Activity  Alcohol Use No   Alcohol/week: 0.0 standard drinks    Family History:  Family History  Problem Relation Age of Onset   Heart disease Father    Cancer Sister        lung   Cancer Brother        colon   Diabetes  Brother    Stroke Maternal Grandmother    Cancer Maternal Grandfather        lung   Cancer Sister        lung   COPD Sister    Heart disease Brother     Past medical history, surgical history, medications, allergies, family history and social history reviewed with patient today and changes made to appropriate areas of the chart.   Review of Systems  Eyes:  Negative for blurred  vision and double vision.  Respiratory:  Negative for shortness of breath.   Cardiovascular:  Negative for chest pain, palpitations and leg swelling.  Neurological:  Negative for dizziness and headaches.  Psychiatric/Behavioral:  Negative for depression and suicidal ideas. The patient is not nervous/anxious.   All other ROS negative except what is listed above and in the HPI.      Objective:    BP 120/69   Pulse 62   Temp 97.8 F (36.6 C) (Oral)   Ht 5' 6.54" (1.69 m)   Wt 137 lb 6.4 oz (62.3 kg)   LMP 06/30/1991 (Approximate)   SpO2 99%   BMI 21.82 kg/m   Wt Readings from Last 3 Encounters:  12/27/20 137 lb 6.4 oz (62.3 kg)  09/13/20 137 lb (62.1 kg)  06/18/20 137 lb 7.2 oz (62.3 kg)    Physical Exam Vitals and nursing note reviewed.  Constitutional:      General: She is awake. She is not in acute distress.    Appearance: She is well-developed. She is not ill-appearing.  HENT:     Head: Normocephalic and atraumatic.     Right Ear: Hearing, tympanic membrane, ear canal and external ear normal. No drainage.     Left Ear: Hearing, tympanic membrane, ear canal and external ear normal. No drainage.     Nose: Nose normal.     Right Sinus: No maxillary sinus tenderness or frontal sinus tenderness.     Left Sinus: No maxillary sinus tenderness or frontal sinus tenderness.     Mouth/Throat:     Mouth: Mucous membranes are moist.     Pharynx: Oropharynx is clear. Uvula midline. No pharyngeal swelling, oropharyngeal exudate or posterior oropharyngeal erythema.  Eyes:     General: Lids are normal.         Right eye: No discharge.        Left eye: No discharge.     Extraocular Movements: Extraocular movements intact.     Conjunctiva/sclera: Conjunctivae normal.     Pupils: Pupils are equal, round, and reactive to light.     Visual Fields: Right eye visual fields normal and left eye visual fields normal.  Neck:     Thyroid: No thyromegaly.     Vascular: No carotid bruit.     Trachea: Trachea normal.  Cardiovascular:     Rate and Rhythm: Normal rate and regular rhythm.     Heart sounds: Normal heart sounds. No murmur heard.   No gallop.  Pulmonary:     Effort: Pulmonary effort is normal. No accessory muscle usage or respiratory distress.     Breath sounds: Normal breath sounds.  Chest:  Breasts:    Right: Normal.     Left: Normal.  Abdominal:     General: Bowel sounds are normal.     Palpations: Abdomen is soft. There is no hepatomegaly or splenomegaly.     Tenderness: There is no abdominal tenderness.  Musculoskeletal:        General: Normal range of motion.     Cervical back: Normal range of motion and neck supple.     Right lower leg: No edema.     Left lower leg: No edema.  Lymphadenopathy:     Head:     Right side of head: No submental, submandibular, tonsillar, preauricular or posterior auricular adenopathy.     Left side of head: No submental, submandibular, tonsillar, preauricular or posterior auricular adenopathy.     Cervical: No cervical adenopathy.  Upper Body:     Right upper body: No supraclavicular, axillary or pectoral adenopathy.     Left upper body: No supraclavicular, axillary or pectoral adenopathy.  Skin:    General: Skin is warm and dry.     Capillary Refill: Capillary refill takes less than 2 seconds.     Findings: No rash.  Neurological:     Mental Status: She is alert and oriented to person, place, and time.     Cranial Nerves: Cranial nerves are intact.     Gait: Gait is intact.     Deep Tendon Reflexes: Reflexes are normal and symmetric.      Reflex Scores:      Brachioradialis reflexes are 2+ on the right side and 2+ on the left side.      Patellar reflexes are 2+ on the right side and 2+ on the left side. Psychiatric:        Attention and Perception: Attention normal.        Mood and Affect: Mood normal.        Speech: Speech normal.        Behavior: Behavior normal. Behavior is cooperative.        Thought Content: Thought content normal.        Judgment: Judgment normal.    Results for orders placed or performed in visit on 06/18/20  Comprehensive metabolic panel  Result Value Ref Range   Glucose 92 65 - 99 mg/dL   BUN 15 8 - 27 mg/dL   Creatinine, Ser 0.85 0.57 - 1.00 mg/dL   eGFR 72 >59 mL/min/1.73   BUN/Creatinine Ratio 18 12 - 28   Sodium 143 134 - 144 mmol/L   Potassium 3.8 3.5 - 5.2 mmol/L   Chloride 103 96 - 106 mmol/L   CO2 25 20 - 29 mmol/L   Calcium 9.2 8.7 - 10.3 mg/dL   Total Protein 6.8 6.0 - 8.5 g/dL   Albumin 4.2 3.7 - 4.7 g/dL   Globulin, Total 2.6 1.5 - 4.5 g/dL   Albumin/Globulin Ratio 1.6 1.2 - 2.2   Bilirubin Total 0.4 0.0 - 1.2 mg/dL   Alkaline Phosphatase 67 44 - 121 IU/L   AST 23 0 - 40 IU/L   ALT 13 0 - 32 IU/L  Lipid Panel w/o Chol/HDL Ratio  Result Value Ref Range   Cholesterol, Total 221 (H) 100 - 199 mg/dL   Triglycerides 112 0 - 149 mg/dL   HDL 77 >39 mg/dL   VLDL Cholesterol Cal 19 5 - 40 mg/dL   LDL Chol Calc (NIH) 125 (H) 0 - 99 mg/dL  Microalbumin, Urine Waived  Result Value Ref Range   Microalb, Ur Waived 150 (H) 0 - 19 mg/L   Creatinine, Urine Waived 300 10 - 300 mg/dL   Microalb/Creat Ratio 30-300 (H) <30 mg/g  HM COLONOSCOPY  Result Value Ref Range   HM Colonoscopy See Report (in chart) See Report (in chart), Patient Reported      Assessment & Plan:   Problem List Items Addressed This Visit       Cardiovascular and Mediastinum   Hypertension    Chronic.  Controlled.  Continue with current medication regimen with Amlodipine 29m daily.  Labs ordered today.   Return to clinic in 6 months for reevaluation.  Call sooner if concerns arise.        Relevant Medications   rosuvastatin (CRESTOR) 20 MG tablet   amLODipine (NORVASC) 5 MG tablet     Musculoskeletal  and Integument   Osteopenia    Last bone density was done in 2018 and showed osteopenia.  Needs updated Dexa Scan.  Ordered during visit today.       Relevant Orders   DG Bone Density     Other   Acute anxiety    Chronic.  Controlled.  Continue with current medication regimen on Prozac $RemoveB'20mg'GildXcvD$ .  Labs ordered today.  Return to clinic in 6 months for reevaluation.  Call sooner if concerns arise.        Relevant Medications   FLUoxetine (PROZAC) 20 MG capsule   Hyperlipidemia    Chronic.  Controlled.  Continue with current medication regimen.  Simvastatin changed to Rosuvastatin due to interaction with other medications.  Labs ordered today.  Return to clinic in 6 months for reevaluation.  Call sooner if concerns arise.        Relevant Medications   rosuvastatin (CRESTOR) 20 MG tablet   amLODipine (NORVASC) 5 MG tablet   Depression, major, single episode, complete remission (HCC)    Chronic.  Controlled.  Continue with current medication regimen on Prozac $RemoveB'20mg'dTMTiTeK$ .  Labs ordered today.  Return to clinic in 6 months for reevaluation.  Call sooner if concerns arise.       Relevant Medications   FLUoxetine (PROZAC) 20 MG capsule   Other Visit Diagnoses     Annual physical exam    -  Primary   Health maintenance reviewed during visit today. Labs ordered. Flu shot given. Mammogram and Dexa scan ordered.    Relevant Orders   CBC with Differential/Platelet   Comprehensive metabolic panel   Lipid panel   TSH   Urinalysis, Routine w reflex microscopic   Encounter for screening mammogram for malignant neoplasm of breast       Relevant Orders   MM Digital Screening   Need for influenza vaccination       Relevant Orders   Flu Vaccine QUAD High Dose(Fluad) (Completed)        Follow up  plan: Return in about 6 months (around 06/26/2021) for HTN, HLD, DM2 FU.   LABORATORY TESTING:  - Pap smear: not applicable  IMMUNIZATIONS:   - Tdap: Tetanus vaccination status reviewed: last tetanus booster within 10 years. - Influenza: Administered today - Pneumovax: Up to date - Prevnar: Up to date - HPV: Not applicable - Zostavax vaccine:  Discussed at visit today  SCREENING: -Mammogram: Ordered today  - Colonoscopy: Up to date  - Bone Density: Ordered today  -Hearing Test: Not applicable  -Spirometry: Not applicable   PATIENT COUNSELING:   Advised to take 1 mg of folate supplement per day if capable of pregnancy.   Sexuality: Discussed sexually transmitted diseases, partner selection, use of condoms, avoidance of unintended pregnancy  and contraceptive alternatives.   Advised to avoid cigarette smoking.  I discussed with the patient that most Morrison either abstain from alcohol or drink within safe limits (<=14/week and <=4 drinks/occasion for males, <=7/weeks and <= 3 drinks/occasion for females) and that the risk for alcohol disorders and other health effects rises proportionally with the number of drinks per week and how often a drinker exceeds daily limits.  Discussed cessation/primary prevention of drug use and availability of treatment for abuse.   Diet: Encouraged to adjust caloric intake to maintain  or achieve ideal body weight, to reduce intake of dietary saturated fat and total fat, to limit sodium intake by avoiding high sodium foods and not adding table salt, and to maintain  adequate dietary potassium and calcium preferably from fresh fruits, vegetables, and low-fat dairy products.    stressed the importance of regular exercise  Injury prevention: Discussed safety belts, safety helmets, smoke detector, smoking near bedding or upholstery.   Dental health: Discussed importance of regular tooth brushing, flossing, and dental visits.    NEXT PREVENTATIVE PHYSICAL  DUE IN 1 YEAR. Return in about 6 months (around 06/26/2021) for HTN, HLD, DM2 FU.

## 2020-12-27 ENCOUNTER — Other Ambulatory Visit: Payer: Self-pay

## 2020-12-27 ENCOUNTER — Ambulatory Visit (INDEPENDENT_AMBULATORY_CARE_PROVIDER_SITE_OTHER): Payer: Medicare PPO | Admitting: Nurse Practitioner

## 2020-12-27 ENCOUNTER — Other Ambulatory Visit: Payer: Self-pay | Admitting: Nurse Practitioner

## 2020-12-27 ENCOUNTER — Encounter: Payer: Self-pay | Admitting: Nurse Practitioner

## 2020-12-27 VITALS — BP 120/69 | HR 62 | Temp 97.8°F | Ht 66.54 in | Wt 137.4 lb

## 2020-12-27 DIAGNOSIS — Z1231 Encounter for screening mammogram for malignant neoplasm of breast: Secondary | ICD-10-CM

## 2020-12-27 DIAGNOSIS — F419 Anxiety disorder, unspecified: Secondary | ICD-10-CM

## 2020-12-27 DIAGNOSIS — Z136 Encounter for screening for cardiovascular disorders: Secondary | ICD-10-CM | POA: Diagnosis not present

## 2020-12-27 DIAGNOSIS — E7800 Pure hypercholesterolemia, unspecified: Secondary | ICD-10-CM | POA: Diagnosis not present

## 2020-12-27 DIAGNOSIS — F325 Major depressive disorder, single episode, in full remission: Secondary | ICD-10-CM

## 2020-12-27 DIAGNOSIS — Z0000 Encounter for general adult medical examination without abnormal findings: Secondary | ICD-10-CM | POA: Diagnosis not present

## 2020-12-27 DIAGNOSIS — Z23 Encounter for immunization: Secondary | ICD-10-CM

## 2020-12-27 DIAGNOSIS — M8580 Other specified disorders of bone density and structure, unspecified site: Secondary | ICD-10-CM

## 2020-12-27 DIAGNOSIS — I10 Essential (primary) hypertension: Secondary | ICD-10-CM | POA: Diagnosis not present

## 2020-12-27 LAB — MICROSCOPIC EXAMINATION
Bacteria, UA: NONE SEEN
Epithelial Cells (non renal): NONE SEEN /hpf (ref 0–10)
WBC, UA: NONE SEEN /hpf (ref 0–5)

## 2020-12-27 LAB — URINALYSIS, ROUTINE W REFLEX MICROSCOPIC
Bilirubin, UA: NEGATIVE
Glucose, UA: NEGATIVE
Leukocytes,UA: NEGATIVE
Nitrite, UA: NEGATIVE
Specific Gravity, UA: 1.02 (ref 1.005–1.030)
Urobilinogen, Ur: 1 mg/dL (ref 0.2–1.0)
pH, UA: 6.5 (ref 5.0–7.5)

## 2020-12-27 MED ORDER — AMLODIPINE BESYLATE 5 MG PO TABS: 5.0000 mg | ORAL_TABLET | Freq: Every day | ORAL | 1 refills | Status: DC

## 2020-12-27 MED ORDER — FLUOXETINE HCL 20 MG PO CAPS: 20.0000 mg | ORAL_CAPSULE | Freq: Every day | ORAL | 1 refills | Status: DC

## 2020-12-27 MED ORDER — ROSUVASTATIN CALCIUM 20 MG PO TABS: 20.0000 mg | ORAL_TABLET | Freq: Every day | ORAL | 1 refills | Status: DC

## 2020-12-27 NOTE — Assessment & Plan Note (Signed)
Chronic.  Controlled.  Continue with current medication regimen on Prozac 20mg .  Labs ordered today.  Return to clinic in 6 months for reevaluation.  Call sooner if concerns arise.

## 2020-12-27 NOTE — Assessment & Plan Note (Signed)
Chronic.  Controlled.  Continue with current medication regimen with Amlodipine 5mg  daily.  Labs ordered today.  Return to clinic in 6 months for reevaluation.  Call sooner if concerns arise.

## 2020-12-27 NOTE — Progress Notes (Signed)
Please let patient know that her urine shows that she has had persistent blood and protein in it for several years.  I am not sure why this is, I would like to send her to a kidney specialist to find out why.  If she agrees I will place the referral. I will send her another message once the rest of her lab work comes back.

## 2020-12-27 NOTE — Assessment & Plan Note (Signed)
Last bone density was done in 2018 and showed osteopenia.  Needs updated Dexa Scan.  Ordered during visit today.

## 2020-12-27 NOTE — Assessment & Plan Note (Signed)
Chronic.  Controlled.  Continue with current medication regimen.  Simvastatin changed to Rosuvastatin due to interaction with other medications.  Labs ordered today.  Return to clinic in 6 months for reevaluation.  Call sooner if concerns arise.

## 2020-12-28 LAB — LIPID PANEL
Chol/HDL Ratio: 2.5 ratio (ref 0.0–4.4)
Cholesterol, Total: 175 mg/dL (ref 100–199)
HDL: 71 mg/dL (ref >39)
LDL Chol Calc (NIH): 86 mg/dL (ref 0–99)
Triglycerides: 99 mg/dL (ref 0–149)
VLDL Cholesterol Cal: 18 mg/dL (ref 5–40)

## 2020-12-28 LAB — CBC WITH DIFFERENTIAL/PLATELET
Basophils Absolute: 0 10*3/uL (ref 0.0–0.2)
Basos: 1 %
EOS (ABSOLUTE): 0.1 10*3/uL (ref 0.0–0.4)
Eos: 2 %
Hematocrit: 35.8 % (ref 34.0–46.6)
Hemoglobin: 11.5 g/dL (ref 11.1–15.9)
Immature Grans (Abs): 0 10*3/uL (ref 0.0–0.1)
Immature Granulocytes: 0 %
Lymphocytes Absolute: 1.2 10*3/uL (ref 0.7–3.1)
Lymphs: 33 %
MCH: 28.3 pg (ref 26.6–33.0)
MCHC: 32.1 g/dL (ref 31.5–35.7)
MCV: 88 fL (ref 79–97)
Monocytes Absolute: 0.5 10*3/uL (ref 0.1–0.9)
Monocytes: 14 %
Neutrophils Absolute: 1.8 10*3/uL (ref 1.4–7.0)
Neutrophils: 50 %
Platelets: 207 10*3/uL (ref 150–450)
RBC: 4.06 x10E6/uL (ref 3.77–5.28)
RDW: 13.9 % (ref 11.7–15.4)
WBC: 3.6 10*3/uL (ref 3.4–10.8)

## 2020-12-28 LAB — COMPREHENSIVE METABOLIC PANEL
ALT: 13 IU/L (ref 0–32)
AST: 24 IU/L (ref 0–40)
Albumin/Globulin Ratio: 1.7 (ref 1.2–2.2)
Albumin: 4.2 g/dL (ref 3.7–4.7)
Alkaline Phosphatase: 66 IU/L (ref 44–121)
BUN/Creatinine Ratio: 21 (ref 12–28)
BUN: 18 mg/dL (ref 8–27)
Bilirubin Total: 0.5 mg/dL (ref 0.0–1.2)
CO2: 25 mmol/L (ref 20–29)
Calcium: 9 mg/dL (ref 8.7–10.3)
Chloride: 101 mmol/L (ref 96–106)
Creatinine, Ser: 0.87 mg/dL (ref 0.57–1.00)
Globulin, Total: 2.5 g/dL (ref 1.5–4.5)
Glucose: 99 mg/dL (ref 70–99)
Potassium: 3.7 mmol/L (ref 3.5–5.2)
Sodium: 141 mmol/L (ref 134–144)
Total Protein: 6.7 g/dL (ref 6.0–8.5)
eGFR: 69 mL/min/{1.73_m2} (ref >59)

## 2020-12-28 LAB — TSH: TSH: 2.09 u[IU]/mL (ref 0.450–4.500)

## 2020-12-30 NOTE — Progress Notes (Signed)
Hi [place].  It was nice to meet you last week.  Your lab work looks good.  No concerns at this time. Continue with your current medication regimen.  Follow up as discussed.  Please let me know if you have any questions.

## 2020-12-31 DIAGNOSIS — H353131 Nonexudative age-related macular degeneration, bilateral, early dry stage: Secondary | ICD-10-CM | POA: Diagnosis not present

## 2020-12-31 DIAGNOSIS — Z0100 Encounter for examination of eyes and vision without abnormal findings: Secondary | ICD-10-CM | POA: Diagnosis not present

## 2021-01-15 ENCOUNTER — Other Ambulatory Visit: Payer: Self-pay

## 2021-01-15 ENCOUNTER — Ambulatory Visit
Admission: RE | Admit: 2021-01-15 | Discharge: 2021-01-15 | Disposition: A | Discharge status: Home / Self Care | Payer: Medicare PPO | Source: Ambulatory Visit | Attending: Nurse Practitioner | Admitting: Nurse Practitioner

## 2021-01-15 DIAGNOSIS — Z13820 Encounter for screening for osteoporosis: Secondary | ICD-10-CM | POA: Insufficient documentation

## 2021-01-15 DIAGNOSIS — Z780 Asymptomatic menopausal state: Secondary | ICD-10-CM | POA: Insufficient documentation

## 2021-01-15 DIAGNOSIS — Z1231 Encounter for screening mammogram for malignant neoplasm of breast: Secondary | ICD-10-CM | POA: Insufficient documentation

## 2021-01-15 DIAGNOSIS — M8589 Other specified disorders of bone density and structure, multiple sites: Secondary | ICD-10-CM | POA: Diagnosis not present

## 2021-01-15 DIAGNOSIS — M8580 Other specified disorders of bone density and structure, unspecified site: Secondary | ICD-10-CM

## 2021-01-16 ENCOUNTER — Telehealth: Payer: Self-pay

## 2021-01-16 NOTE — Telephone Encounter (Signed)
-----   Message from Jon Billings, NP sent at 01/16/2021 11:07 AM EDT ----- Please let patient know her bone density scan shows that she is osteopenic.  I recommend she take a vitamin D with calcium supplement. I also recommend that she increase her weight bearing exercises such as walking to help with bone density.  Please let me know if she has any questions.

## 2021-01-16 NOTE — Telephone Encounter (Signed)
Patient aware of results.

## 2021-01-16 NOTE — Progress Notes (Signed)
Please let patient know her bone density scan shows that she is osteopenic.  I recommend she take a vitamin D with calcium supplement. I also recommend that she increase her weight bearing exercises such as walking to help with bone density.  Please let me know if she has any questions.

## 2021-01-22 ENCOUNTER — Inpatient Hospital Stay
Admission: RE | Admit: 2021-01-22 | Discharge: 2021-01-22 | Disposition: A | Discharge status: Home / Self Care | Payer: Self-pay | Source: Ambulatory Visit | Attending: *Deleted | Admitting: *Deleted

## 2021-01-22 ENCOUNTER — Other Ambulatory Visit: Payer: Self-pay | Admitting: *Deleted

## 2021-01-22 DIAGNOSIS — Z1231 Encounter for screening mammogram for malignant neoplasm of breast: Secondary | ICD-10-CM

## 2021-01-22 NOTE — Progress Notes (Signed)
Please let patient know her Mammogram did not show any evidence of a malignancy.  The recommendation is to repeat the Mammogram in 1 year.  

## 2021-06-25 ENCOUNTER — Telehealth: Payer: Self-pay | Admitting: Nurse Practitioner

## 2021-06-25 NOTE — Telephone Encounter (Signed)
Copied from [place] [id number]. Topic: General - Call Back - No Documentation ?>> Jun 25, 2021 10:54 AM Erick Blinks wrote: ?Reason for CRM: Pt wants to know if she needs to fast for her upcoming appt, please advise ?

## 2021-06-26 NOTE — Telephone Encounter (Signed)
Pt aware.

## 2021-06-26 NOTE — Progress Notes (Signed)
? ?BP (!) 114/56   Pulse (!) 57   Temp 98.7 ?F (37.1 ?C) (Oral)   Wt 141 lb 9.6 oz (64.2 kg)   LMP 06/30/1991 (Approximate)   SpO2 99%   BMI 22.49 kg/m?   ? ?Subjective:  ? ? Patient ID: Connie Morrison, female    DOB: 11/22/1945, 76 y.o.   MRN: [id number] ? ?HPI: ?[name] is a 76 y.o. female ? ?Chief Complaint  ?Patient presents with  ? Depression  ? Hyperlipidemia  ? Hypertension  ? ?HYPERTENSION / HYPERLIPIDEMIA ?Satisfied with current treatment? yes ?Duration of hypertension: chronic ?BP monitoring frequency: not checking ?BP medication side effects: no ?Past BP meds: amlodipine ?Duration of hyperlipidemia: chronic ?Cholesterol medication side effects: no ?Cholesterol supplements: none ?Past cholesterol medications: Crestor ?Medication compliance: excellent compliance ?Aspirin: yes ?Recent stressors: no ?Recurrent headaches: no ?Visual changes: no ?Palpitations: no ?Dyspnea: no ?Chest pain: no ?Lower extremity edema: no ?Dizzy/lightheaded: no ? ?DEPRESSION ?Mood status: controlled ?Satisfied with current treatment?: yes ?Symptom severity: mild  ?Duration of current treatment : chronic ?Side effects: no ?Medication compliance: excellent compliance ?Psychotherapy/counseling: no  ?Previous psychiatric medications: prozac ?Depressed mood: no ?Anxious mood: no ?Anhedonia: no ?Significant weight loss or gain: no ?Insomnia: no  ?Fatigue: no ?Feelings of worthlessness or guilt: no ?Impaired concentration/indecisiveness: no ?Suicidal ideations: no ?Hopelessness: no ?Crying spells: no ? ?  06/27/2021  ?  9:53 AM 12/27/2020  ?  8:10 AM 09/13/2020  ?  1:46 PM 06/18/2020  ?  9:14 AM 12/15/2019  ?  8:23 AM  ?Depression screen PHQ 2/9  ?Decreased Interest 0 0 0 0 0  ?Down, Depressed, Hopeless 0 0 0 0 0  ?PHQ - 2 Score 0 0 0 0 0  ?Altered sleeping 0 0  0 0  ?Tired, decreased energy 0 0  0 0  ?Change in appetite 0 0  0 0  ?Feeling bad or failure about yourself  0 0  0 0  ?Trouble concentrating 1 0  0 0  ?Moving slowly or  fidgety/restless 0   0 0  ?Suicidal thoughts 0 0  0 0  ?PHQ-9 Score 1 0  0 0  ?Difficult doing work/chores Not difficult at all Not difficult at all   Not difficult at all  ? ? ?Relevant past medical, surgical, family and social history reviewed and updated as indicated. Interim medical history since our last visit reviewed. ?Allergies and medications reviewed and updated. ? ?Review of Systems  ?Constitutional: Negative.   ?Respiratory: Negative.    ?Cardiovascular: Negative.   ?Gastrointestinal: Negative.   ?Musculoskeletal: Negative.   ?Psychiatric/Behavioral: Negative.  Negative for dysphoric mood. The patient is not nervous/anxious.   ? ?Per HPI unless specifically indicated above ? ?   ?Objective:  ?  ?BP (!) 114/56   Pulse (!) 57   Temp 98.7 ?F (37.1 ?C) (Oral)   Wt 141 lb 9.6 oz (64.2 kg)   LMP 06/30/1991 (Approximate)   SpO2 99%   BMI 22.49 kg/m?   ?Wt Readings from Last 3 Encounters:  ?06/27/21 141 lb 9.6 oz (64.2 kg)  ?12/27/20 137 lb 6.4 oz (62.3 kg)  ?09/13/20 137 lb (62.1 kg)  ?  ?Physical Exam ?Vitals and nursing note reviewed.  ?Constitutional:   ?   General: She is not in acute distress. ?   Appearance: Normal appearance. She is not ill-appearing, toxic-appearing or diaphoretic.  ?HENT:  ?   Head: Normocephalic and atraumatic.  ?   Right Ear: External ear normal.  ?     Left Ear: External ear normal.  ?   Nose: Nose normal.  ?   Mouth/Throat:  ?   Mouth: Mucous membranes are moist.  ?   Pharynx: Oropharynx is clear.  ?Eyes:  ?   General: No scleral icterus.    ?   Right eye: No discharge.     ?   Left eye: No discharge.  ?   Extraocular Movements: Extraocular movements intact.  ?   Conjunctiva/sclera: Conjunctivae normal.  ?   Pupils: Pupils are equal, round, and reactive to light.  ?Cardiovascular:  ?   Rate and Rhythm: Normal rate and regular rhythm.  ?   Pulses: Normal pulses.  ?   Heart sounds: Normal heart sounds. No murmur heard. ?  No friction rub. No gallop.  ?Pulmonary:  ?   Effort:  Pulmonary effort is normal. No respiratory distress.  ?   Breath sounds: Normal breath sounds. No stridor. No wheezing, rhonchi or rales.  ?Chest:  ?   Chest wall: No tenderness.  ?Musculoskeletal:     ?   General: Normal range of motion.  ?   Cervical back: Normal range of motion and neck supple.  ?Skin: ?   General: Skin is warm and dry.  ?   Capillary Refill: Capillary refill takes less than 2 seconds.  ?   Coloration: Skin is not jaundiced or pale.  ?   Findings: No bruising, erythema, lesion or rash.  ?Neurological:  ?   General: No focal deficit present.  ?   Mental Status: She is alert and oriented to person, place, and time. Mental status is at baseline.  ?Psychiatric:     ?   Mood and Affect: Mood normal.     ?   Behavior: Behavior normal.     ?   Thought Content: Thought content normal.     ?   Judgment: Judgment normal.  ? ? ?Results for orders placed or performed in visit on 12/27/20  ?Microscopic Examination  ? Urine  ?Result Value Ref Range  ? WBC, UA None seen 0 - 5 /hpf  ? RBC 3-10 (A) 0 - 2 /hpf  ? Epithelial Cells (non renal) None seen 0 - 10 /hpf  ? Mucus, UA Present (A) Not Estab.  ? Bacteria, UA None seen None seen/Few  ?CBC with Differential/Platelet  ?Result Value Ref Range  ? WBC 3.6 3.4 - 10.8 x10E3/uL  ? RBC 4.06 3.77 - 5.28 x10E6/uL  ? Hemoglobin 11.5 11.1 - 15.9 g/dL  ? Hematocrit 35.8 34.0 - 46.6 %  ? MCV 88 79 - 97 fL  ? MCH 28.3 26.6 - 33.0 pg  ? MCHC 32.1 31.5 - 35.7 g/dL  ? RDW 13.9 11.7 - 15.4 %  ? Platelets 207 150 - 450 x10E3/uL  ? Neutrophils 50 Not Estab. %  ? Lymphs 33 Not Estab. %  ? Monocytes 14 Not Estab. %  ? Eos 2 Not Estab. %  ? Basos 1 Not Estab. %  ? Neutrophils Absolute 1.8 1.4 - 7.0 x10E3/uL  ? Lymphocytes Absolute 1.2 0.7 - 3.1 x10E3/uL  ? Monocytes Absolute 0.5 0.1 - 0.9 x10E3/uL  ? EOS (ABSOLUTE) 0.1 0.0 - 0.4 x10E3/uL  ? Basophils Absolute 0.0 0.0 - 0.2 x10E3/uL  ? Immature Granulocytes 0 Not Estab. %  ? Immature Grans (Abs) 0.0 0.0 - 0.1 x10E3/uL  ?Comprehensive  metabolic panel  ?Result Value Ref Range  ? Glucose 99 70 - 99 mg/dL  ? BUN 18 8 -  27 mg/dL  ? Creatinine, Ser 0.87 0.57 - 1.00 mg/dL  ? eGFR 69 >59 mL/min/1.73  ? BUN/Creatinine Ratio 21 12 - 28  ? Sodium 141 134 - 144 mmol/L  ? Potassium 3.7 3.5 - 5.2 mmol/L  ? Chloride 101 96 - 106 mmol/L  ? CO2 25 20 - 29 mmol/L  ? Calcium 9.0 8.7 - 10.3 mg/dL  ? Total Protein 6.7 6.0 - 8.5 g/dL  ? Albumin 4.2 3.7 - 4.7 g/dL  ? Globulin, Total 2.5 1.5 - 4.5 g/dL  ? Albumin/Globulin Ratio 1.7 1.2 - 2.2  ? Bilirubin Total 0.5 0.0 - 1.2 mg/dL  ? Alkaline Phosphatase 66 44 - 121 IU/L  ? AST 24 0 - 40 IU/L  ? ALT 13 0 - 32 IU/L  ?Lipid panel  ?Result Value Ref Range  ? Cholesterol, Total 175 100 - 199 mg/dL  ? Triglycerides 99 0 - 149 mg/dL  ? HDL 71 >39 mg/dL  ? VLDL Cholesterol Cal 18 5 - 40 mg/dL  ? LDL Chol Calc (NIH) 86 0 - 99 mg/dL  ? Chol/HDL Ratio 2.5 0.0 - 4.4 ratio  ?TSH  ?Result Value Ref Range  ? TSH 2.090 0.450 - 4.500 uIU/mL  ?Urinalysis, Routine w reflex microscopic  ?Result Value Ref Range  ? Specific Gravity, UA 1.020 1.005 - 1.030  ? pH, UA 6.5 5.0 - 7.5  ? Color, UA Yellow Yellow  ? Appearance Ur Cloudy (A) Clear  ? Leukocytes,UA Negative Negative  ? Protein,UA 2+ (A) Negative/Trace  ? Glucose, UA Negative Negative  ? Ketones, UA Trace (A) Negative  ? RBC, UA 3+ (A) Negative  ? Bilirubin, UA Negative Negative  ? Urobilinogen, Ur 1.0 0.2 - 1.0 mg/dL  ? Nitrite, UA Negative Negative  ? Microscopic Examination See below:   ? ?   ?Assessment & Plan:  ? ?Problem List Items Addressed This Visit   ? ?  ? Cardiovascular and Mediastinum  ? Hypertension - Primary  ?  Chronic.  Controlled.  Continue with current medication regimen on Amlodipine 5mg daily and HCTZ PRN.  Refills sent today.  Labs ordered today.  Return to clinic in 6 months for reevaluation.  Call sooner if concerns arise.  ? ?  ?  ? Relevant Medications  ? amLODipine (NORVASC) 5 MG tablet  ? rosuvastatin (CRESTOR) 20 MG tablet  ? Other Relevant Orders  ? Comp  Met (CMET)  ?  ? Other  ? Acute anxiety  ?  Chronic.  Controlled.  Continue with current medication regimen of Prozac 20mg.  Refills sent today. Labs ordered today.  Return to clinic in 6 months for reevaluation.  Call

## 2021-06-27 ENCOUNTER — Encounter: Payer: Self-pay | Admitting: Nurse Practitioner

## 2021-06-27 ENCOUNTER — Ambulatory Visit: Payer: Medicare PPO | Admitting: Nurse Practitioner

## 2021-06-27 VITALS — BP 114/56 | HR 57 | Temp 98.7°F | Wt 141.6 lb

## 2021-06-27 DIAGNOSIS — E7800 Pure hypercholesterolemia, unspecified: Secondary | ICD-10-CM

## 2021-06-27 DIAGNOSIS — F325 Major depressive disorder, single episode, in full remission: Secondary | ICD-10-CM

## 2021-06-27 DIAGNOSIS — I10 Essential (primary) hypertension: Secondary | ICD-10-CM | POA: Diagnosis not present

## 2021-06-27 DIAGNOSIS — F419 Anxiety disorder, unspecified: Secondary | ICD-10-CM | POA: Diagnosis not present

## 2021-06-27 MED ORDER — ROSUVASTATIN CALCIUM 20 MG PO TABS: 20.0000 mg | ORAL_TABLET | Freq: Every day | ORAL | 1 refills | Status: DC

## 2021-06-27 MED ORDER — FLUOXETINE HCL 20 MG PO CAPS: 20.0000 mg | ORAL_CAPSULE | Freq: Every day | ORAL | 1 refills | Status: DC

## 2021-06-27 MED ORDER — AMLODIPINE BESYLATE 5 MG PO TABS: 5.0000 mg | ORAL_TABLET | Freq: Every day | ORAL | 1 refills | Status: DC

## 2021-06-27 NOTE — Assessment & Plan Note (Signed)
Chronic.  Controlled.  Continue with current medication regimen on Amlodipine '5mg'$  daily and HCTZ PRN.  Refills sent today.  Labs ordered today.  Return to clinic in 6 months for reevaluation.  Call sooner if concerns arise.  ? ?

## 2021-06-27 NOTE — Assessment & Plan Note (Signed)
Chronic.  Controlled.  Continue with current medication regimen of Crestor 20mg daily.  Refills sent today.  Labs ordered today.  Return to clinic in 6 months for reevaluation.  Call sooner if concerns arise.    

## 2021-06-27 NOTE — Assessment & Plan Note (Signed)
Chronic.  Controlled.  Continue with current medication regimen of Prozac '20mg'$ .  Refills sent today. Labs ordered today.  Return to clinic in 6 months for reevaluation.  Call sooner if concerns arise.  ?

## 2021-06-27 NOTE — Assessment & Plan Note (Signed)
Chronic.  Controlled.  Continue with current medication regimen of Prozac '20mg'$ .  Refills sent today. Labs ordered today.  Return to clinic in 6 months for reevaluation.  Call sooner if concerns arise.  ? ?

## 2021-06-28 LAB — COMPREHENSIVE METABOLIC PANEL
ALT: 16 IU/L (ref 0–32)
AST: 23 IU/L (ref 0–40)
Albumin/Globulin Ratio: 1.9 (ref 1.2–2.2)
Albumin: 4.3 g/dL (ref 3.7–4.7)
Alkaline Phosphatase: 62 IU/L (ref 44–121)
BUN/Creatinine Ratio: 19 (ref 12–28)
BUN: 16 mg/dL (ref 8–27)
Bilirubin Total: 0.5 mg/dL (ref 0.0–1.2)
CO2: 29 mmol/L (ref 20–29)
Calcium: 9.1 mg/dL (ref 8.7–10.3)
Chloride: 104 mmol/L (ref 96–106)
Creatinine, Ser: 0.84 mg/dL (ref 0.57–1.00)
Globulin, Total: 2.3 g/dL (ref 1.5–4.5)
Glucose: 92 mg/dL (ref 70–99)
Potassium: 3.9 mmol/L (ref 3.5–5.2)
Sodium: 145 mmol/L — ABNORMAL HIGH (ref 134–144)
Total Protein: 6.6 g/dL (ref 6.0–8.5)
eGFR: 72 mL/min/{1.73_m2} (ref >59)

## 2021-06-28 LAB — LIPID PANEL
Chol/HDL Ratio: 2.1 ratio (ref 0.0–4.4)
Cholesterol, Total: 166 mg/dL (ref 100–199)
HDL: 78 mg/dL (ref >39)
LDL Chol Calc (NIH): 71 mg/dL (ref 0–99)
Triglycerides: 96 mg/dL (ref 0–149)
VLDL Cholesterol Cal: 17 mg/dL (ref 5–40)

## 2021-06-30 NOTE — Progress Notes (Signed)
Hi Connie Morrison.  Your lab work looks good.  No concerns at this time.  Follow up as discussed.

## 2021-07-17 ENCOUNTER — Ambulatory Visit: Payer: Medicare PPO | Admitting: Nurse Practitioner

## 2021-08-01 DIAGNOSIS — Z0100 Encounter for examination of eyes and vision without abnormal findings: Secondary | ICD-10-CM | POA: Diagnosis not present

## 2021-08-01 DIAGNOSIS — H26491 Other secondary cataract, right eye: Secondary | ICD-10-CM | POA: Diagnosis not present

## 2021-08-11 DIAGNOSIS — H26491 Other secondary cataract, right eye: Secondary | ICD-10-CM | POA: Diagnosis not present

## 2021-09-02 DIAGNOSIS — H26492 Other secondary cataract, left eye: Secondary | ICD-10-CM | POA: Diagnosis not present

## 2021-09-15 ENCOUNTER — Ambulatory Visit (INDEPENDENT_AMBULATORY_CARE_PROVIDER_SITE_OTHER): Payer: Medicare PPO | Admitting: *Deleted

## 2021-09-15 DIAGNOSIS — Z0000 Encounter for general adult medical examination without abnormal findings: Secondary | ICD-10-CM | POA: Diagnosis not present

## 2021-09-15 NOTE — Patient Instructions (Signed)
[name] , Thank you for taking time to come for your Medicare Wellness Visit. I appreciate your ongoing commitment to your health goals. Please review the following plan we discussed and let me know if I can assist you in the future.   Screening recommendations/referrals: Colonoscopy: up to date Mammogram: up to date Bone Density: up to date Recommended yearly ophthalmology/optometry visit for glaucoma screening and checkup Recommended yearly dental visit for hygiene and checkup  Vaccinations: Influenza vaccine: up to date Pneumococcal vaccine: up to date Tdap vaccine: Education provided Shingles vaccine: up to date    Advanced directives: up to date  Conditions/risks identified:   Next appointment: 01-08-2022 @ 8:20  [facility] 42 Years and Older, Female Preventive care refers to lifestyle choices and visits with your health care provider that can promote health and wellness. What does preventive care include? A yearly physical exam. This is also called an annual well check. Dental exams once or twice a year. Routine eye exams. Ask your health care provider how often you should have your eyes checked. Personal lifestyle choices, including: Daily care of your teeth and gums. Regular physical activity. Eating a healthy diet. Avoiding tobacco and drug use. Limiting alcohol use. Practicing safe sex. Taking low-dose aspirin every day. Taking vitamin and mineral supplements as recommended by your health care provider. What happens during an annual well check? The services and screenings done by your health care provider during your annual well check will depend on your age, overall health, lifestyle risk factors, and family history of disease. Counseling  Your health care provider may ask you questions about your: Alcohol use. Tobacco use. Drug use. Emotional well-being. Home and relationship well-being. Sexual activity. Eating habits. History of  falls. Memory and ability to understand (cognition). Work and work Statistician. Reproductive health. Screening  You may have the following tests or measurements: Height, weight, and BMI. Blood pressure. Lipid and cholesterol levels. These may be checked every 5 years, or more frequently if you are over 79 years old. Skin check. Lung cancer screening. You may have this screening every year starting at age 4 if you have a 30-pack-year history of smoking and currently smoke or have quit within the past 15 years. Fecal occult blood test (FOBT) of the stool. You may have this test every year starting at age 23. Flexible sigmoidoscopy or colonoscopy. You may have a sigmoidoscopy every 5 years or a colonoscopy every 10 years starting at age 32. Hepatitis C blood test. Hepatitis B blood test. Sexually transmitted disease (STD) testing. Diabetes screening. This is done by checking your blood sugar (glucose) after you have not eaten for a while (fasting). You may have this done every 1-3 years. Bone density scan. This is done to screen for osteoporosis. You may have this done starting at age 78. Mammogram. This may be done every 1-2 years. Talk to your health care provider about how often you should have regular mammograms. Talk with your health care provider about your test results, treatment options, and if necessary, the need for more tests. Vaccines  Your health care provider may recommend certain vaccines, such as: Influenza vaccine. This is recommended every year. Tetanus, diphtheria, and acellular pertussis (Tdap, Td) vaccine. You may need a Td booster every 10 years. Zoster vaccine. You may need this after age 24. Pneumococcal 13-valent conjugate (PCV13) vaccine. One dose is recommended after age 25. Pneumococcal polysaccharide (PPSV23) vaccine. One dose is recommended after age 63. Talk to your health  care provider about which screenings and vaccines you need and how often you need  them. This information is not intended to replace advice given to you by your health care provider. Make sure you discuss any questions you have with your health care provider. Document Released: 04/12/2015 Document Revised: 12/04/2015 Document Reviewed: 01/15/2015 Elsevier Interactive Patient Education  2017 [employer] Prevention in the Home Falls can cause injuries. They can happen to people of all ages. There are many things you can do to make your home safe and to help prevent falls. What can I do on the outside of my home? Regularly fix the edges of walkways and driveways and fix any cracks. Remove anything that might make you trip as you walk through a door, such as a raised step or threshold. Trim any bushes or trees on the path to your home. Use bright outdoor lighting. Clear any walking paths of anything that might make someone trip, such as rocks or tools. Regularly check to see if handrails are loose or broken. Make sure that both sides of any steps have handrails. Any raised decks and porches should have guardrails on the edges. Have any leaves, snow, or ice cleared regularly. Use sand or salt on walking paths during winter. Clean up any spills in your garage right away. This includes oil or grease spills. What can I do in the bathroom? Use night lights. Install grab bars by the toilet and in the tub and shower. Do not use towel bars as grab bars. Use non-skid mats or decals in the tub or shower. If you need to sit down in the shower, use a plastic, non-slip stool. Keep the floor dry. Clean up any water that spills on the floor as soon as it happens. Remove soap buildup in the tub or shower regularly. Attach bath mats securely with double-sided non-slip rug tape. Do not have throw rugs and other things on the floor that can make you trip. What can I do in the bedroom? Use night lights. Make sure that you have a light by your bed that is easy to reach. Do not use  any sheets or blankets that are too big for your bed. They should not hang down onto the floor. Have a firm chair that has side arms. You can use this for support while you get dressed. Do not have throw rugs and other things on the floor that can make you trip. What can I do in the kitchen? Clean up any spills right away. Avoid walking on wet floors. Keep items that you use a lot in easy-to-reach places. If you need to reach something above you, use a strong step stool that has a grab bar. Keep electrical cords out of the way. Do not use floor polish or wax that makes floors slippery. If you must use wax, use non-skid floor wax. Do not have throw rugs and other things on the floor that can make you trip. What can I do with my stairs? Do not leave any items on the stairs. Make sure that there are handrails on both sides of the stairs and use them. Fix handrails that are broken or loose. Make sure that handrails are as long as the stairways. Check any carpeting to make sure that it is firmly attached to the stairs. Fix any carpet that is loose or worn. Avoid having throw rugs at the top or bottom of the stairs. If you do have throw rugs, attach them to  the floor with carpet tape. Make sure that you have a light switch at the top of the stairs and the bottom of the stairs. If you do not have them, ask someone to add them for you. What else can I do to help prevent falls? Wear shoes that: Do not have high heels. Have rubber bottoms. Are comfortable and fit you well. Are closed at the toe. Do not wear sandals. If you use a stepladder: Make sure that it is fully opened. Do not climb a closed stepladder. Make sure that both sides of the stepladder are locked into place. Ask someone to hold it for you, if possible. Clearly mark and make sure that you can see: Any grab bars or handrails. First and last steps. Where the edge of each step is. Use tools that help you move around (mobility aids)  if they are needed. These include: Canes. Walkers. Scooters. Crutches. Turn on the lights when you go into a dark area. Replace any light bulbs as soon as they burn out. Set up your furniture so you have a clear path. Avoid moving your furniture around. If any of your floors are uneven, fix them. If there are any pets around you, be aware of where they are. Review your medicines with your doctor. Some medicines can make you feel dizzy. This can increase your chance of falling. Ask your doctor what other things that you can do to help prevent falls. This information is not intended to replace advice given to you by your health care provider. Make sure you discuss any questions you have with your health care provider. Document Released: 01/10/2009 Document Revised: 08/22/2015 Document Reviewed: 04/20/2014 Elsevier Interactive Patient Education  2017 [employer].

## 2021-09-15 NOTE — Progress Notes (Signed)
Medicare   Subjective:   Connie Morrison is a 76 y.o. female who presents for Medicare Annual (Subsequent) preventive examination.  I connected with  Gardiner Ramus on 09/15/21 by a telephone enabled telemedicine application and verified that I am speaking with the correct person using two identifiers.   I discussed the limitations of evaluation and management by telemedicine. The patient expressed understanding and agreed to proceed.  Patient location: home  Provider location: Tele-health- home    Review of Systems     Cardiac Risk Factors include: advanced age (>43mn, >>76women);hypertension;family history of premature cardiovascular disease     Objective:    There were no vitals filed for this visit. There is no height or weight on file to calculate BMI.     09/15/2021   12:09 PM 09/13/2020    1:45 PM 06/08/2019    2:44 PM 11/23/2018    7:55 AM 11/02/2018    7:32 AM 11/02/2017   10:12 AM 07/24/2016    8:05 AM  Advanced Directives  Does Patient Have a Medical Advance Directive? No Yes Yes Yes Yes Yes Yes  Type of AIndustrial/product designerof ARetreatLiving will Living will;Healthcare Power of ARichlandLiving will HPageLiving will HCrowellLiving will HGreenleafLiving will  Does patient want to make changes to medical advance directive?    No - Patient declined No - Patient declined    Copy of HGailin Chart? No - copy requested No - copy requested No - copy requested Yes - validated most recent copy scanned in chart (See row information) No - copy requested  Yes  Would patient like information on creating a medical advance directive? No - Patient declined          Current Medications (verified) Outpatient Encounter Medications as of 09/15/2021  Medication Sig   amLODipine (NORVASC) 5 MG tablet Take 1 tablet (5 mg total) by mouth  daily.   diphenhydramine-acetaminophen (TYLENOL PM) 25-500 MG TABS tablet Take 0.5 tablets by mouth at bedtime as needed.   FLUoxetine (PROZAC) 20 MG capsule Take 1 capsule (20 mg total) by mouth daily.   hydrochlorothiazide (HYDRODIURIL) 25 MG tablet Take 0.5 tablets (12.5 mg total) by mouth as needed.   Multiple Vitamin (MULTIVITAMIN) tablet Take 1 tablet by mouth daily.   rosuvastatin (CRESTOR) 20 MG tablet Take 1 tablet (20 mg total) by mouth daily.   tretinoin (RETIN-A) 0.1 % cream APPLY PEA SIZE AMOUNT TO ENTIRE DRY FACE NIGHTLY   No facility-administered encounter medications on file as of 09/15/2021.    Allergies (verified) Cymbalta [duloxetine hcl] and Sulfa antibiotics   History: Past Medical History:  Diagnosis Date   Allergic rhinitis    Anginal pain (HCC)    Anxiety    Complication of anesthesia    itching after anesthesia (50 yrs ago)   Depression    Dyspnea    (pt denies)   GERD (gastroesophageal reflux disease)    Hematuria    Hyperlipidemia    Hypertension    (pt denies)   Menopause    Microscopic hematuria    Vaginal atrophy    Wears dentures    lower partial   Past Surgical History:  Procedure Laterality Date   BREAST SURGERY     cyst removed   CATARACT EXTRACTION W/PHACO Left 11/02/2018   Procedure: CATARACT EXTRACTION PHACO AND INTRAOCULAR LENS PLACEMENT (IOC)  LEFT;  Surgeon: Leandrew Koyanagi, MD;  Location: [place];  Service: Ophthalmology;  Laterality: Left;   CATARACT EXTRACTION W/PHACO Right 11/23/2018   Procedure: CATARACT EXTRACTION PHACO AND INTRAOCULAR LENS PLACEMENT ([facility]) - right  00:50.6  17.1;  Surgeon: Leandrew Koyanagi, MD;  Location: [facility];  Service: Ophthalmology;  Laterality: Right;   COLONOSCOPY     COLONOSCOPY WITH PROPOFOL N/A 11/02/2017   Procedure: COLONOSCOPY WITH PROPOFOL;  Surgeon: Lollie Sails, MD;  Location[facility] ENDOSCOPY;  Service: Endoscopy;  Laterality: N/A;   TONSILLECTOMY     TUMOR  EXCISION Right    kidney   Family History  Problem Relation Age of Onset   Heart disease Father    Cancer Sister        lung   Cancer Sister        lung   COPD Sister    Stroke Maternal Grandmother    Cancer Maternal Grandfather        lung   Cancer Brother        colon   Diabetes Brother    Heart disease Brother    Breast cancer Neg Hx    Social History   Socioeconomic History   Marital status: Married    Spouse name: Not on file   Number of children: Not on file   Years of education: Not on file   Highest education level: Not on file  Occupational History   Occupation: retired   Tobacco Use   Smoking status: Former    Types: Cigarettes    Quit date: 1980    Years since quitting: 43.4   Smokeless tobacco: Never  Vaping Use   Vaping Use: Never used  Substance and Sexual Activity   Alcohol use: No    Alcohol/week: 0.0 standard drinks of alcohol   Drug use: No   Sexual activity: Yes  Other Topics Concern   Not on file  Social History Narrative   Not on file   Social Determinants of Health   Financial Resource Strain: Low Risk  (09/15/2021)   Overall Financial Resource Strain (CARDIA)    Difficulty of Paying Living Expenses: Not hard at all  Food Insecurity: No Food Insecurity (09/15/2021)   Hunger Vital Sign    Worried About Running Out of Food in the Last Year: Never true    Ran Out of Food in the Last Year: Never true  Transportation Needs: No Transportation Needs (09/15/2021)   PRAPARE - Hydrologist (Medical): No    Lack of Transportation (Non-Medical): No  Physical Activity: Sufficiently Active (09/15/2021)   Exercise Vital Sign    Days of Exercise per Week: 5 days    Minutes of Exercise per Session: 60 min  Stress: No Stress Concern Present (09/15/2021)   [place]    Feeling of Stress : Not at all  Social Connections: [place] (09/15/2021)    Social Connection and Isolation Panel [NHANES]    Frequency of Communication with Friends and Family: More than three times a week    Frequency of Social Gatherings with Friends and Family: Twice a week    Attends Religious Services: More than 4 times per year    Active Member of [employer] or Organizations: Yes    Attends Music therapist: More than 4 times per year    Marital Status: Married    Tobacco Counseling Counseling given: Not Answered   Clinical Intake:  Pre-visit preparation completed: Yes  Pain : No/denies pain     Nutritional Risks: None Diabetes: No     Diabetic?  no  Interpreter Needed?: No  Information entered by :: Leroy Kennedy LPN   Activities of Daily Living    09/15/2021   12:30 PM  In your present state of health, do you have any difficulty performing the following activities:  Hearing? 0  Vision? 0  Difficulty concentrating or making decisions? 0  Walking or climbing stairs? 0  Dressing or bathing? 0  Doing errands, shopping? 0  Preparing Food and eating ? N  Using the Toilet? N  In the past six months, have you accidently leaked urine? N  Do you have problems with loss of bowel control? N  Managing your Medications? N  Managing your Finances? N  Housekeeping or managing your Housekeeping? N    Patient Care Team: Jon Billings, NP as PCP - General  Indicate any recent Medical Services you may have received from other than [facility] providers in the past year (date may be approximate).     Assessment:   This is a routine wellness examination for Connie Morrison.  Hearing/Vision screen Hearing Screening - Comments:: No trouble hearing Vision Screening - Comments:: Dr Wallace Going  Up to date  Dietary issues and exercise activities discussed: Current Exercise Habits: Structured exercise class, Type of exercise: strength training/weights;treadmill, Time (Minutes): 45, Frequency (Times/Week): 5, Weekly Exercise (Minutes/Week): 225,  Intensity: Moderate   Goals Addressed             This Visit's Progress    Patient Stated       Stay healthy       Depression Screen    09/15/2021   12:29 PM 06/27/2021    9:53 AM 12/27/2020    8:10 AM 09/13/2020    1:46 PM 06/18/2020    9:14 AM 12/15/2019    8:23 AM 06/08/2019    2:47 PM  PHQ 2/9 Scores  PHQ - 2 Score 0 0 0 0 0 0 0  PHQ- 9 Score  1 0  0 0     Fall Risk    09/15/2021   12:09 PM 06/27/2021    9:53 AM 12/27/2020    8:10 AM 09/13/2020    1:46 PM 06/18/2020    9:13 AM  Fall Risk   Falls in the past year? 0 0 0 0 0  Number falls in past yr: 0 0 0  0  Injury with Fall?  0 0  0  Risk for fall due to :  No Fall Risks No Fall Risks Medication side effect No Fall Risks  Follow up Falls evaluation completed;Education provided;Falls prevention discussed Falls evaluation completed Falls evaluation completed Falls evaluation completed;Education provided;Falls prevention discussed Falls evaluation completed    FALL RISK PREVENTION PERTAINING TO THE HOME:  Any stairs in or around the home? No  If so, are there any without handrails? No  Home free of loose throw rugs in walkways, pet beds, electrical cords, etc? Yes  Adequate lighting in your home to reduce risk of falls? Yes   ASSISTIVE DEVICES UTILIZED TO PREVENT FALLS:  Life alert? No  Use of a cane, walker or w/c? No  Grab bars in the bathroom? No  Shower chair or bench in shower? Yes  Elevated toilet seat or a handicapped toilet? Yes   TIMED UP AND GO:  Was the test performed? No .    Cognitive Function:  09/15/2021   12:10 PM 09/13/2020    1:47 PM  6CIT Screen  What Year? 0 points 0 points  What month? 0 points 0 points  What time? 0 points 0 points  Count back from 20 0 points 0 points  Months in reverse 0 points 0 points  Repeat phrase 0 points 2 points  Total Score 0 points 2 points    Immunizations Immunization History  Administered Date(s) Administered   Fluad Quad(high Dose 65+)  12/01/2018, 12/15/2019, 12/27/2020   Influenza, High Dose Seasonal PF 12/27/2015, 01/10/2018   Influenza-Unspecified 12/23/2012, 12/27/2013   PFIZER(Purple Top)SARS-COV-2 Vaccination 05/25/2019, 06/21/2019   Pneumococcal Conjugate-13 07/13/2014   Pneumococcal Polysaccharide-23 03/15/2013   Td 12/03/2004   Zoster Recombinat (Shingrix) 06/16/2018, 03/13/2021   Zoster, Live 02/05/2011    TDAP status: Due, Education has been provided regarding the importance of this vaccine. Advised may receive this vaccine at local pharmacy or Health Dept. Aware to provide a copy of the vaccination record if obtained from local pharmacy or Health Dept. Verbalized acceptance and understanding.  Flu Vaccine status: Up to date  Pneumococcal vaccine status: Up to date  Covid-19 vaccine status: Information provided on how to obtain vaccines.   Qualifies for Shingles Vaccine? Yes   Zostavax completed Yes   Shingrix Completed?: Yes  Screening Tests Health Maintenance  Topic Date Due   COVID-19 Vaccine (3 - Pfizer series) 08/16/2019   TETANUS/TDAP  09/16/2022 (Originally 12/04/2014)   INFLUENZA VACCINE  10/28/2021   MAMMOGRAM  01/15/2022   COLONOSCOPY (Pts 45-21yr Insurance coverage will need to be confirmed)  06/18/2029   Pneumonia Vaccine 76 Years old  Completed   DEXA SCAN  Completed   Hepatitis C Screening  Completed   Zoster Vaccines- Shingrix  Completed   HPV VACCINES  Aged Out    Health Maintenance  Health Maintenance Due  Topic Date Due   COVID-19 Vaccine (3 - Pfizer series) 08/16/2019   Colonoscopy 2021 normal family hx colon cancer   Mammogram status: Completed  . Repeat every year  Bone Density status: Completed 2022. Results reflect: Bone density results: OSTEOPENIA. Repeat every 2 years.  Lung Cancer Screening: (Low Dose CT Chest recommended if Age 76-80years, 30 pack-year currently smoking OR have quit w/in 15years.) does not qualify.   Lung Cancer Screening Referral:    Additional Screening:  Hepatitis C Screening: does not qualify; Completed 2017  Vision Screening: Recommended annual ophthalmology exams for early detection of glaucoma and other disorders of the eye. Is the patient up to date with their annual eye exam?  Yes  Who is the provider or what is the name of the office in which the patient attends annual eye exams? [facility] is not established with a provider, would they like to be referred to a provider to establish care? No .   Dental Screening: Recommended annual dental exams for proper oral hygiene  Community Resource Referral / Chronic Care Management: CRR required this visit?  No   CCM required this visit?  No      Plan:     I have personally reviewed and noted the following in the patient's chart:   Medical and social history Use of alcohol, tobacco or illicit drugs  Current medications and supplements including opioid prescriptions.  Functional ability and status Nutritional status Physical activity Advanced directives List of other physicians Hospitalizations, surgeries, and ER visits in previous 12 months Vitals Screenings to include cognitive, depression, and falls Referrals and appointments  In addition, I have reviewed and discussed with patient certain preventive protocols, quality metrics, and best practice recommendations. A written personalized care plan for preventive services as well as general preventive health recommendations were provided to patient.     Leroy Kennedy, LPN   [id number]/68/0321   Nurse Notes:

## 2021-10-10 DIAGNOSIS — K219 Gastro-esophageal reflux disease without esophagitis: Secondary | ICD-10-CM | POA: Diagnosis not present

## 2021-10-10 DIAGNOSIS — Z87891 Personal history of nicotine dependence: Secondary | ICD-10-CM | POA: Diagnosis not present

## 2021-10-10 DIAGNOSIS — Z882 Allergy status to sulfonamides status: Secondary | ICD-10-CM | POA: Diagnosis not present

## 2021-10-10 DIAGNOSIS — E785 Hyperlipidemia, unspecified: Secondary | ICD-10-CM | POA: Diagnosis not present

## 2021-10-10 DIAGNOSIS — Z809 Family history of malignant neoplasm, unspecified: Secondary | ICD-10-CM | POA: Diagnosis not present

## 2021-10-10 DIAGNOSIS — M8580 Other specified disorders of bone density and structure, unspecified site: Secondary | ICD-10-CM | POA: Diagnosis not present

## 2021-10-10 DIAGNOSIS — Z8249 Family history of ischemic heart disease and other diseases of the circulatory system: Secondary | ICD-10-CM | POA: Diagnosis not present

## 2021-10-10 DIAGNOSIS — I10 Essential (primary) hypertension: Secondary | ICD-10-CM | POA: Diagnosis not present

## 2021-10-10 DIAGNOSIS — F324 Major depressive disorder, single episode, in partial remission: Secondary | ICD-10-CM | POA: Diagnosis not present

## 2021-10-22 ENCOUNTER — Encounter: Payer: Self-pay | Admitting: Nurse Practitioner

## 2021-10-22 ENCOUNTER — Ambulatory Visit: Payer: Medicare PPO | Admitting: Nurse Practitioner

## 2021-10-22 VITALS — BP 125/67 | HR 75 | Temp 97.7°F | Wt 143.2 lb

## 2021-10-22 DIAGNOSIS — L237 Allergic contact dermatitis due to plants, except food: Secondary | ICD-10-CM | POA: Diagnosis not present

## 2021-10-22 MED ORDER — PREDNISONE 10 MG PO TABS: 10.0000 mg | ORAL_TABLET | Freq: Every day | ORAL | 0 refills | Status: DC

## 2021-10-22 NOTE — Progress Notes (Signed)
BP 125/67   Pulse 75   Temp 97.7 F (36.5 C) (Oral)   Wt 143 lb 3.2 oz (65 kg)   LMP 06/30/1991 (Approximate)   SpO2 97%   BMI 22.74 kg/m    Subjective:    Patient ID: Connie Morrison, female    DOB: [birth year], 76 y.o.   MRN: [id number]  HPI: Connie Morrison is a 76 y.o. female  Chief Complaint  Patient presents with   Rash    Located on multiple areas of the body such as chest, inner thighs, finger(s), nose. Onset  Monday. C/o itchiness, redness, and states she has small bumps    POISON IVY Rash started on Monday.  Itchy, red, and bumpy.  It is on her chest, inner thighs, fingers, and nose.      Relevant past medical, surgical, family and social history reviewed and updated as indicated. Interim medical history since our last visit reviewed. Allergies and medications reviewed and updated.  Review of Systems  Skin:  Positive for rash.    Per HPI unless specifically indicated above     Objective:    BP 125/67   Pulse 75   Temp 97.7 F (36.5 C) (Oral)   Wt 143 lb 3.2 oz (65 kg)   LMP 06/30/1991 (Approximate)   SpO2 97%   BMI 22.74 kg/m   Wt Readings from Last 3 Encounters:  10/22/21 143 lb 3.2 oz (65 kg)  06/27/21 141 lb 9.6 oz (64.2 kg)  12/27/20 137 lb 6.4 oz (62.3 kg)    Physical Exam Vitals and nursing note reviewed.  Constitutional:      General: She is not in acute distress.    Appearance: Normal appearance. She is normal weight. She is not ill-appearing, toxic-appearing or diaphoretic.  HENT:     Head: Normocephalic.     Right Ear: External ear normal.     Left Ear: External ear normal.     Nose: Nose normal.     Mouth/Throat:     Mouth: Mucous membranes are moist.     Pharynx: Oropharynx is clear.  Eyes:     General:        Right eye: No discharge.        Left eye: No discharge.     Extraocular Movements: Extraocular movements intact.     Conjunctiva/sclera: Conjunctivae normal.     Pupils: Pupils are equal, round, and reactive to light.   Cardiovascular:     Rate and Rhythm: Normal rate and regular rhythm.     Heart sounds: No murmur heard. Pulmonary:     Effort: Pulmonary effort is normal. No respiratory distress.     Breath sounds: Normal breath sounds. No wheezing or rales.  Musculoskeletal:     Cervical back: Normal range of motion and neck supple.  Skin:    General: Skin is warm and dry.     Capillary Refill: Capillary refill takes less than 2 seconds.       Neurological:     General: No focal deficit present.     Mental Status: She is alert and oriented to person, place, and time. Mental status is at baseline.  Psychiatric:        Mood and Affect: Mood normal.        Behavior: Behavior normal.        Thought Content: Thought content normal.        Judgment: Judgment normal.     Results for orders placed or performed  in visit on 06/27/21  Comp Met (CMET)  Result Value Ref Range   Glucose 92 70 - 99 mg/dL   BUN 16 8 - 27 mg/dL   Creatinine, Ser 0.84 0.57 - 1.00 mg/dL   eGFR 72 >59 mL/min/1.73   BUN/Creatinine Ratio 19 12 - 28   Sodium 145 (H) 134 - 144 mmol/L   Potassium 3.9 3.5 - 5.2 mmol/L   Chloride 104 96 - 106 mmol/L   CO2 29 20 - 29 mmol/L   Calcium 9.1 8.7 - 10.3 mg/dL   Total Protein 6.6 6.0 - 8.5 g/dL   Albumin 4.3 3.7 - 4.7 g/dL   Globulin, Total 2.3 1.5 - 4.5 g/dL   Albumin/Globulin Ratio 1.9 1.2 - 2.2   Bilirubin Total 0.5 0.0 - 1.2 mg/dL   Alkaline Phosphatase 62 44 - 121 IU/L   AST 23 0 - 40 IU/L   ALT 16 0 - 32 IU/L  Lipid Profile  Result Value Ref Range   Cholesterol, Total 166 100 - 199 mg/dL   Triglycerides 96 0 - 149 mg/dL   HDL 78 >39 mg/dL   VLDL Cholesterol Cal 17 5 - 40 mg/dL   LDL Chol Calc (NIH) 71 0 - 99 mg/dL   Chol/HDL Ratio 2.1 0.0 - 4.4 ratio      Assessment & Plan:   Problem List Items Addressed This Visit   None Visit Diagnoses     Poison ivy    -  Primary   On chest, thighs and face. Will treat with prednisone taper. Recommend benadryl or other  antihistamine for itching. FU if symptoms do not improve.        Follow up plan: No follow-ups on file.

## 2021-10-29 ENCOUNTER — Ambulatory Visit: Payer: Self-pay | Admitting: *Deleted

## 2021-10-29 ENCOUNTER — Ambulatory Visit: Payer: Medicare PPO | Admitting: Nurse Practitioner

## 2021-10-29 ENCOUNTER — Encounter: Payer: Self-pay | Admitting: Nurse Practitioner

## 2021-10-29 VITALS — BP 133/66 | HR 62 | Temp 97.8°F | Wt 140.9 lb

## 2021-10-29 DIAGNOSIS — R1011 Right upper quadrant pain: Secondary | ICD-10-CM | POA: Diagnosis not present

## 2021-10-29 NOTE — Telephone Encounter (Signed)
Reason for Disposition  [1] MILD pain (e.g., does not interfere with normal activities) AND [2] comes and goes (cramps) AND [3] present > 72 hours  (Exception: This same abdominal pain is a chronic symptom recurrent or ongoing AND present > 4 weeks.)  Answer Assessment - Initial Assessment Questions 1. LOCATION: "Where does it hurt?"      midline 2. RADIATION: "Does the pain shoot anywhere else?" (e.g., chest, back)     No radiation 3. ONSET: "When did the pain begin?" (e.g., minutes, hours or days ago)      Few days 4. SUDDEN: "Gradual or sudden onset?"     *No Answer* 5. PATTERN "Does the pain come and go, or is it constant?"    - If it comes and goes: "How long does it last?" "Do you have pain now?"     (Note: Comes and goes means the pain is intermittent. It goes away completely between bouts.)    - If constant: "Is it getting better, staying the same, or getting worse?"      (Note: Constant means the pain never goes away completely; most serious pain is constant and gets worse.)      Comes and goes 6. SEVERITY: "How bad is the pain?"  (e.g., Scale 1-10; mild, moderate, or severe)    - MILD (1-3): Doesn't interfere with normal activities, abdomen soft and not tender to touch..     - MODERATE (4-7): Interferes with normal activities or awakens from sleep, abdomen tender to touch.     - SEVERE (8-10): Excruciating pain, doubled over, unable to do any normal activities.       mild 7. RECURRENT SYMPTOM: "Have you ever had this type of stomach pain before?" If Yes, ask: "When was the last time?" and "What happened that time?"      Tums- didn't help 8. AGGRAVATING FACTORS: "Does anything seem to cause this pain?" (e.g., foods, stress, alcohol)     Really bad last night 9. CARDIAC SYMPTOMS: "Do you have any of the following symptoms: chest pain, difficulty breathing, sweating, nausea?"     no 10. OTHER SYMPTOMS: "Do you have any other symptoms?" (e.g., back pain, diarrhea, fever, urination  pain, vomiting)       no 11. PREGNANCY: "Is there any chance you are pregnant?" "When was your last menstrual period?"  Protocols used: Abdominal Pain - Upper-A-AH

## 2021-10-29 NOTE — Telephone Encounter (Signed)
  Chief Complaint: requesting appointment- stomach discomfort Symptoms: upper midline abdominal discomfort Frequency: 2-3 days- worse-comes and goes Pertinent Negatives: Patient denies cardiac, back pain, diarrhea, fever, urination pain, vomiting Disposition: '[]'$ ED /'[]'$ Urgent Care (no appt availability in office) / '[x]'$ Appointment(In office/virtual)/ '[]'$  [facility]/ '[]'$ Home Care/ '[]'$ Refused Recommended Disposition /'[]'$ [facility]/ '[]'$  Follow-up with PCP Additional Notes:

## 2021-10-29 NOTE — Progress Notes (Signed)
BP 133/66   Pulse 62   Temp 97.8 F (36.6 C) (Oral)   Wt 140 lb 14.4 oz (63.9 kg)   LMP 06/30/1991 (Approximate)   SpO2 98%   BMI 22.38 kg/m    Subjective:    Patient ID: Connie Morrison, female    DOB: 08/20/1945, 76 y.o.   MRN: [id number]  HPI: Connie Morrison is a 76 y.o. female  Chief Complaint  Patient presents with   Abdominal Pain    Epigastric region worsening past few days per patient. Denies N/V/D.    ABDOMINAL ISSUES Duration: months Nature:  pressure and achy Location: epigastric  Severity: mild  Radiation: no Episode duration: Frequency: intermittent Alleviating factors: tums Aggravating factors: after eating Treatments attempted: antacids Constipation: no Diarrhea: no Episodes of diarrhea/day: Mucous in the stool: no Heartburn: yes Bloating:no Flatulence: yes Nausea: no Vomiting: no Episodes of vomit/day: Melena or hematochezia: no Rash: no Jaundice: no Fever: no Weight loss: no  Relevant past medical, surgical, family and social history reviewed and updated as indicated. Interim medical history since our last visit reviewed. Allergies and medications reviewed and updated.  Review of Systems  Gastrointestinal:  Positive for abdominal pain. Negative for blood in stool, constipation, diarrhea, nausea and vomiting.    Per HPI unless specifically indicated above     Objective:    BP 133/66   Pulse 62   Temp 97.8 F (36.6 C) (Oral)   Wt 140 lb 14.4 oz (63.9 kg)   LMP 06/30/1991 (Approximate)   SpO2 98%   BMI 22.38 kg/m   Wt Readings from Last 3 Encounters:  10/29/21 140 lb 14.4 oz (63.9 kg)  10/22/21 143 lb 3.2 oz (65 kg)  06/27/21 141 lb 9.6 oz (64.2 kg)    Physical Exam Vitals and nursing note reviewed.  Constitutional:      General: She is not in acute distress.    Appearance: Normal appearance. She is normal weight. She is not ill-appearing, toxic-appearing or diaphoretic.  HENT:     Head: Normocephalic.     Right Ear:  External ear normal.     Left Ear: External ear normal.     Nose: Nose normal.     Mouth/Throat:     Mouth: Mucous membranes are moist.     Pharynx: Oropharynx is clear.  Eyes:     General:        Right eye: No discharge.        Left eye: No discharge.     Extraocular Movements: Extraocular movements intact.     Conjunctiva/sclera: Conjunctivae normal.     Pupils: Pupils are equal, round, and reactive to light.  Cardiovascular:     Rate and Rhythm: Normal rate and regular rhythm.     Heart sounds: No murmur heard. Pulmonary:     Effort: Pulmonary effort is normal. No respiratory distress.     Breath sounds: Normal breath sounds. No wheezing or rales.  Abdominal:     Tenderness: There is abdominal tenderness in the right upper quadrant and periumbilical area. Negative signs include Murphy's sign and McBurney's sign.  Musculoskeletal:     Cervical back: Normal range of motion and neck supple.  Skin:    General: Skin is warm and dry.     Capillary Refill: Capillary refill takes less than 2 seconds.  Neurological:     General: No focal deficit present.     Mental Status: She is alert and oriented to person, place, and time. Mental  status is at baseline.  Psychiatric:        Mood and Affect: Mood normal.        Behavior: Behavior normal.        Thought Content: Thought content normal.        Judgment: Judgment normal.     Results for orders placed or performed in visit on 06/27/21  Comp Met (CMET)  Result Value Ref Range   Glucose 92 70 - 99 mg/dL   BUN 16 8 - 27 mg/dL   Creatinine, Ser 0.84 0.57 - 1.00 mg/dL   eGFR 72 >59 mL/min/1.73   BUN/Creatinine Ratio 19 12 - 28   Sodium 145 (H) 134 - 144 mmol/L   Potassium 3.9 3.5 - 5.2 mmol/L   Chloride 104 96 - 106 mmol/L   CO2 29 20 - 29 mmol/L   Calcium 9.1 8.7 - 10.3 mg/dL   Total Protein 6.6 6.0 - 8.5 g/dL   Albumin 4.3 3.7 - 4.7 g/dL   Globulin, Total 2.3 1.5 - 4.5 g/dL   Albumin/Globulin Ratio 1.9 1.2 - 2.2   Bilirubin  Total 0.5 0.0 - 1.2 mg/dL   Alkaline Phosphatase 62 44 - 121 IU/L   AST 23 0 - 40 IU/L   ALT 16 0 - 32 IU/L  Lipid Profile  Result Value Ref Range   Cholesterol, Total 166 100 - 199 mg/dL   Triglycerides 96 0 - 149 mg/dL   HDL 78 >39 mg/dL   VLDL Cholesterol Cal 17 5 - 40 mg/dL   LDL Chol Calc (NIH) 71 0 - 99 mg/dL   Chol/HDL Ratio 2.1 0.0 - 4.4 ratio      Assessment & Plan:   Problem List Items Addressed This Visit   None Visit Diagnoses     RUQ abdominal pain    -  Primary   Will obtain US of RUQ to rule out gallbladder eitology. If unremarkable can start omeprazole to help with symptoms. Follow up after symptoms.   Relevant Orders   US Abdomen Limited RUQ (LIVER/GB)        Follow up plan: Return if symptoms worsen or fail to improve.

## 2021-11-06 ENCOUNTER — Encounter: Payer: Self-pay | Admitting: Nurse Practitioner

## 2021-11-06 ENCOUNTER — Ambulatory Visit
Admission: RE | Admit: 2021-11-06 | Discharge: 2021-11-06 | Disposition: A | Discharge status: Home / Self Care | Payer: Medicare PPO | Source: Ambulatory Visit | Attending: Nurse Practitioner | Admitting: Nurse Practitioner

## 2021-11-06 DIAGNOSIS — K7689 Other specified diseases of liver: Secondary | ICD-10-CM | POA: Diagnosis not present

## 2021-11-06 DIAGNOSIS — R1011 Right upper quadrant pain: Secondary | ICD-10-CM | POA: Diagnosis not present

## 2021-11-06 MED ORDER — OMEPRAZOLE 20 MG PO CPDR: 20.0000 mg | DELAYED_RELEASE_CAPSULE | Freq: Every day | ORAL | 1 refills | Status: DC

## 2021-11-06 NOTE — Progress Notes (Signed)
Please let patient know that her ultrasound was normal.  No concerns with the liver or gallbladder.

## 2021-12-26 ENCOUNTER — Other Ambulatory Visit: Payer: Self-pay | Admitting: Nurse Practitioner

## 2021-12-26 NOTE — Telephone Encounter (Signed)
Requested Prescriptions  Pending Prescriptions Disp Refills  . rosuvastatin (CRESTOR) 20 MG tablet [Pharmacy Med Name: ROSUVASTATIN CALCIUM 20 MG TAB] 90 tablet 1    Sig: TAKE 1 TABLET BY MOUTH EVERY DAY     Cardiovascular:  Antilipid - Statins 2 Failed - 12/26/2021  1:55 AM      Failed - Lipid Panel in normal range within the last 12 months    Cholesterol, Total  Date Value Ref Range Status  06/27/2021 166 100 - 199 mg/dL Final   Cholesterol Piccolo, Waived  Date Value Ref Range Status  12/27/2015 143 <200 mg/dL Final    Comment:                            Desirable                <200                         Borderline High      200- 239                         High                     >239    LDL Chol Calc (NIH)  Date Value Ref Range Status  06/27/2021 71 0 - 99 mg/dL Final   HDL  Date Value Ref Range Status  06/27/2021 78 >39 mg/dL Final   Triglycerides  Date Value Ref Range Status  06/27/2021 96 0 - 149 mg/dL Final   Triglycerides Piccolo,Waived  Date Value Ref Range Status  12/27/2015 214 (H) <150 mg/dL Final    Comment:                            Normal                   <150                         Borderline High     150 - 199                         High                200 - 499                         Very High                >499          Passed - Cr in normal range and within 360 days    Creatinine, Ser  Date Value Ref Range Status  06/27/2021 0.84 0.57 - 1.00 mg/dL Final         Passed - Patient is not pregnant      Passed - Valid encounter within last 12 months    Recent Outpatient Visits          1 month ago RUQ abdominal pain   [facility] Jon Billings, NP   2 months ago [place], NP   6 months ago Primary hypertension   [place], NP   12 months ago  Annual physical exam   [facility] Jon Billings, NP   1 year ago  Depression, major, single episode, complete remission [facility])   [facility] Valerie Roys, DO      Future Appointments            In 1 week Jon Billings, NP [facility], [place]

## 2022-01-07 NOTE — Progress Notes (Signed)
BP 133/71   Pulse (!) 57   Temp 97.7 F (36.5 C) (Oral)   Ht 5' 7"  (1.702 m)   Wt 140 lb 14.4 oz (63.9 kg)   LMP 06/30/1991 (Approximate)   SpO2 90%   BMI 22.07 kg/m    Subjective:    Patient ID: Connie Morrison, female    DOB: 04-13-1945, 76 y.o.   MRN: [id number]  HPI: Connie Morrison is a 76 y.o. female presenting on 01/08/2022 for comprehensive medical examination. Current medical complaints include:none  She currently lives with: Menopausal Symptoms: no  HYPERTENSION / HYPERLIPIDEMIA Satisfied with current treatment? no Duration of hypertension: years BP monitoring frequency: not checking BP range:  BP medication side effects: no Past BP meds: amlodipine and PRN HCTZ Duration of hyperlipidemia: years Cholesterol medication side effects: no Cholesterol supplements: none Past cholesterol medications: rosuvastatin (crestor) Medication compliance:  excellent Aspirin: no Recent stressors: no Recurrent headaches: no Visual changes: no Palpitations: no Dyspnea: no Chest pain: no Lower extremity edema: no Dizzy/lightheaded: no  ANXIETY/DEPRESSION Mood has been up and down.  Had a death in the family and since then it has been difficult.  Denies SI.  Feels like Prozac is working well for her.   Depression Screen done today and results listed below:     01/08/2022    8:28 AM 10/22/2021    9:55 AM 09/15/2021   12:29 PM 06/27/2021    9:53 AM 12/27/2020    8:10 AM  Depression screen PHQ 2/9  Decreased Interest 1 0 0 0 0  Down, Depressed, Hopeless 1 0 0 0 0  PHQ - 2 Score 2 0 0 0 0  Altered sleeping 0 0  0 0  Tired, decreased energy 0 0  0 0  Change in appetite 1 2  0 0  Feeling bad or failure about yourself  0 0  0 0  Trouble concentrating 0 0  1 0  Moving slowly or fidgety/restless 0 0  0   Suicidal thoughts 0 0  0 0  PHQ-9 Score 3 2  1  0  Difficult doing work/chores Not difficult at all Not difficult at all  Not difficult at all Not difficult at all    The  patient does not have a history of falls. I did complete a risk assessment for falls. A plan of care for falls was documented.   Past Medical History:  Past Medical History:  Diagnosis Date   Allergic rhinitis    Anginal pain (HCC)    Anxiety    Complication of anesthesia    itching after anesthesia (50 yrs ago)   Depression    Dyspnea    (pt denies)   GERD (gastroesophageal reflux disease)    Hematuria    Hyperlipidemia    Hypertension    (pt denies)   Menopause    Microscopic hematuria    Vaginal atrophy    Wears dentures    lower partial    Surgical History:  Past Surgical History:  Procedure Laterality Date   BREAST SURGERY     cyst removed   CATARACT EXTRACTION W/PHACO Left 11/02/2018   Procedure: CATARACT EXTRACTION PHACO AND INTRAOCULAR LENS PLACEMENT ([place])  LEFT;  Surgeon: Leandrew Koyanagi, MD;  Location: [facility];  Service: Ophthalmology;  Laterality: Left;   CATARACT EXTRACTION W/PHACO Right 11/23/2018   Procedure: CATARACT EXTRACTION PHACO AND INTRAOCULAR LENS PLACEMENT ([place]) - right  00:50.6  17.1;  Surgeon: Leandrew Koyanagi, MD;  Location: [facility];  Service: Ophthalmology;  Laterality: Right;   COLONOSCOPY     COLONOSCOPY WITH PROPOFOL N/A 11/02/2017   Procedure: COLONOSCOPY WITH PROPOFOL;  Surgeon: Lollie Sails, MD;  Location: [facility];  Service: Endoscopy;  Laterality: N/A;   TONSILLECTOMY     TUMOR EXCISION Right    kidney    Medications:  Current Outpatient Medications on File Prior to Visit  Medication Sig   diphenhydramine-acetaminophen (TYLENOL PM) 25-500 MG TABS tablet Take 0.5 tablets by mouth at bedtime as needed.   hydrochlorothiazide (HYDRODIURIL) 25 MG tablet Take 0.5 tablets (12.5 mg total) by mouth as needed.   Multiple Vitamin (MULTIVITAMIN) tablet Take 1 tablet by mouth daily.   rosuvastatin (CRESTOR) 20 MG tablet TAKE 1 TABLET BY MOUTH EVERY DAY   tretinoin (RETIN-A) 0.1 % cream APPLY PEA SIZE  AMOUNT TO ENTIRE DRY FACE NIGHTLY   No current facility-administered medications on file prior to visit.    Allergies:  Allergies  Allergen Reactions   Cymbalta [Duloxetine Hcl] Other (See Comments)    constipation   Sulfa Antibiotics Rash    Social History:  Social History   Socioeconomic History   Marital status: Married    Spouse name: Not on file   Number of children: Not on file   Years of education: Not on file   Highest education level: Not on file  Occupational History   Occupation: retired   Tobacco Use   Smoking status: Former    Types: Cigarettes    Quit date: [place] since quitting: 43.8   Smokeless tobacco: Never  Vaping Use   Vaping Use: Never used  Substance and Sexual Activity   Alcohol use: No    Alcohol/week: 0.0 standard drinks of alcohol   Drug use: No   Sexual activity: Yes  Other Topics Concern   Not on file  Social History Narrative   Not on file   Social Determinants of Health   Financial Resource Strain: Low Risk  (09/15/2021)   Overall Financial Resource Strain (CARDIA)    Difficulty of Paying Living Expenses: Not hard at all  Food Insecurity: No Food Insecurity (09/15/2021)   Hunger Vital Sign    Worried About Running Out of Food in the Last Year: Never true    [place] in the Last Year: Never true  Transportation Needs: No Transportation Needs (09/15/2021)   PRAPARE - Hydrologist (Medical): No    Lack of Transportation (Non-Medical): No  Physical Activity: Sufficiently Active (09/15/2021)   Exercise Vital Sign    Days of Exercise per Week: 5 days    Minutes of Exercise per Session: 60 min  Stress: No Stress Concern Present (09/15/2021)   [place]    Feeling of Stress : Not at all  Social Connections: [place] (09/15/2021)   Social Connection and Isolation Panel [NHANES]    Frequency of Communication with Friends  and Family: More than three times a week    Frequency of Social Gatherings with Friends and Family: Twice a week    Attends Religious Services: More than 4 times per year    Active Member of [employer] or Organizations: Yes    Attends Archivist Meetings: More than 4 times per year    Marital Status: Married  Human resources officer Violence: Not At Risk (09/15/2021)   Humiliation, Afraid, Rape, and Kick questionnaire    Fear of Current  or Ex-Partner: No    Emotionally Abused: No    Physically Abused: No    Sexually Abused: No   Social History   Tobacco Use  Smoking Status Former   Types: Cigarettes   Quit date: 1980   Years since quitting: 43.8  Smokeless Tobacco Never   Social History   Substance and Sexual Activity  Alcohol Use No   Alcohol/week: 0.0 standard drinks of alcohol    Family History:  Family History  Problem Relation Age of Onset   Heart disease Father    Cancer Sister        lung   Cancer Sister        lung   COPD Sister    Stroke Maternal Grandmother    Cancer Maternal Grandfather        lung   Cancer Brother        colon   Diabetes Brother    Heart disease Brother    Breast cancer Neg Hx     Past medical history, surgical history, medications, allergies, family history and social history reviewed with patient today and changes made to appropriate areas of the chart.   Review of Systems  Eyes:  Negative for blurred vision and double vision.  Respiratory:  Negative for shortness of breath.   Cardiovascular:  Negative for chest pain, palpitations and leg swelling.  Neurological:  Negative for dizziness and headaches.  Psychiatric/Behavioral:  Positive for depression. Negative for suicidal ideas. The patient is not nervous/anxious.    All other ROS negative except what is listed above and in the HPI.      Objective:    BP 133/71   Pulse (!) 57   Temp 97.7 F (36.5 C) (Oral)   Ht 5' 7"  (1.702 m)   Wt 140 lb 14.4 oz (63.9 kg)   LMP  06/30/1991 (Approximate)   SpO2 90%   BMI 22.07 kg/m   Wt Readings from Last 3 Encounters:  01/08/22 140 lb 14.4 oz (63.9 kg)  10/29/21 140 lb 14.4 oz (63.9 kg)  10/22/21 143 lb 3.2 oz (65 kg)    Physical Exam Vitals and nursing note reviewed.  Constitutional:      General: She is awake. She is not in acute distress.    Appearance: She is well-developed. She is not ill-appearing.  HENT:     Head: Normocephalic and atraumatic.     Right Ear: Hearing, tympanic membrane, ear canal and external ear normal. No drainage.     Left Ear: Hearing, tympanic membrane, ear canal and external ear normal. No drainage.     Nose: Nose normal.     Right Sinus: No maxillary sinus tenderness or frontal sinus tenderness.     Left Sinus: No maxillary sinus tenderness or frontal sinus tenderness.     Mouth/Throat:     Mouth: Mucous membranes are moist.     Pharynx: Oropharynx is clear. Uvula midline. No pharyngeal swelling, oropharyngeal exudate or posterior oropharyngeal erythema.  Eyes:     General: Lids are normal.        Right eye: No discharge.        Left eye: No discharge.     Extraocular Movements: Extraocular movements intact.     Conjunctiva/sclera: Conjunctivae normal.     Pupils: Pupils are equal, round, and reactive to light.     Visual Fields: Right eye visual fields normal and left eye visual fields normal.  Neck:     Thyroid: No thyromegaly.  Vascular: No carotid bruit.     Trachea: Trachea normal.  Cardiovascular:     Rate and Rhythm: Normal rate and regular rhythm.     Heart sounds: Normal heart sounds. No murmur heard.    No gallop.  Pulmonary:     Effort: Pulmonary effort is normal. No accessory muscle usage or respiratory distress.     Breath sounds: Normal breath sounds.  Chest:  Breasts:    Right: Normal.     Left: Normal.  Abdominal:     General: Bowel sounds are normal.     Palpations: Abdomen is soft. There is no hepatomegaly or splenomegaly.     Tenderness:  There is no abdominal tenderness.  Musculoskeletal:        General: Normal range of motion.     Cervical back: Normal range of motion and neck supple.     Right lower leg: No edema.     Left lower leg: No edema.  Lymphadenopathy:     Head:     Right side of head: No submental, submandibular, tonsillar, preauricular or posterior auricular adenopathy.     Left side of head: No submental, submandibular, tonsillar, preauricular or posterior auricular adenopathy.     Cervical: No cervical adenopathy.     Upper Body:     Right upper body: No supraclavicular, axillary or pectoral adenopathy.     Left upper body: No supraclavicular, axillary or pectoral adenopathy.  Skin:    General: Skin is warm and dry.     Capillary Refill: Capillary refill takes less than 2 seconds.     Findings: No rash.  Neurological:     Mental Status: She is alert and oriented to person, place, and time.     Gait: Gait is intact.     Deep Tendon Reflexes: Reflexes are normal and symmetric.     Reflex Scores:      Brachioradialis reflexes are 2+ on the right side and 2+ on the left side.      Patellar reflexes are 2+ on the right side and 2+ on the left side. Psychiatric:        Attention and Perception: Attention normal.        Mood and Affect: Mood normal.        Speech: Speech normal.        Behavior: Behavior normal. Behavior is cooperative.        Thought Content: Thought content normal.        Judgment: Judgment normal.     Results for orders placed or performed in visit on 06/27/21  Comp Met (CMET)  Result Value Ref Range   Glucose 92 70 - 99 mg/dL   BUN 16 8 - 27 mg/dL   Creatinine, Ser 0.84 0.57 - 1.00 mg/dL   eGFR 72 >59 mL/min/1.73   BUN/Creatinine Ratio 19 12 - 28   Sodium 145 (H) 134 - 144 mmol/L   Potassium 3.9 3.5 - 5.2 mmol/L   Chloride 104 96 - 106 mmol/L   CO2 29 20 - 29 mmol/L   Calcium 9.1 8.7 - 10.3 mg/dL   Total Protein 6.6 6.0 - 8.5 g/dL   Albumin 4.3 3.7 - 4.7 g/dL   Globulin,  Total 2.3 1.5 - 4.5 g/dL   Albumin/Globulin Ratio 1.9 1.2 - 2.2   Bilirubin Total 0.5 0.0 - 1.2 mg/dL   Alkaline Phosphatase 62 44 - 121 IU/L   AST 23 0 - 40 IU/L   ALT 16 0 - 32  IU/L  Lipid Profile  Result Value Ref Range   Cholesterol, Total 166 100 - 199 mg/dL   Triglycerides 96 0 - 149 mg/dL   HDL 78 >39 mg/dL   VLDL Cholesterol Cal 17 5 - 40 mg/dL   LDL Chol Calc (NIH) 71 0 - 99 mg/dL   Chol/HDL Ratio 2.1 0.0 - 4.4 ratio      Assessment & Plan:   Problem List Items Addressed This Visit       Cardiovascular and Mediastinum   Hypertension    Chronic.  Controlled.  Continue with current medication regimen on Amlodipine 28m daily and HCTZ PRN. Rarely uses the HCTZ. Refills sent today.  Labs ordered today.  Return to clinic in 6 months for reevaluation.  Call sooner if concerns arise.        Relevant Medications   amLODipine (NORVASC) 5 MG tablet     Musculoskeletal and Integument   Osteopenia    Last bone density in 12/2020.  Continue with vitamin D plus calcium.  Will repeat next year.        Other   Hyperlipidemia    Chronic.  Controlled.  Continue with current medication regimen of Crestor 274mdaily.  Refills sent today.  Labs ordered today.  Return to clinic in 6 months for reevaluation.  Call sooner if concerns arise.        Relevant Medications   amLODipine (NORVASC) 5 MG tablet   Other Relevant Orders   Lipid panel   Depression, major, single episode, complete remission (HCC)    Chronic.  Not as well controlled due to death in the family.  However, she is trying to move past it.  Continue with current medication regimen of Prozac 2070m Refills sent today. Labs ordered today.  Return to clinic in 6 months for reevaluation.  Call sooner if concerns arise.       Relevant Medications   FLUoxetine (PROZAC) 20 MG capsule   Other Visit Diagnoses     Annual physical exam    -  Primary   Health maintenance reviewed during visit today. Labs ordered. Mammogram  ordered.  Flu shot given.   Relevant Orders   CBC with Differential/Platelet   Comprehensive metabolic panel   Lipid panel   TSH   Urinalysis, Routine w reflex microscopic   Encounter for screening mammogram for malignant neoplasm of breast       Relevant Orders   MM 3D SCREEN BREAST BILATERAL   Need for influenza vaccination       Relevant Orders   Flu Vaccine QUAD High Dose(Fluad) (Completed)        Follow up plan: Return in about 6 months (around 07/10/2022) for HTN, HLD, DM2 FU.   LABORATORY TESTING:  - Pap smear: not applicable  IMMUNIZATIONS:   - Tdap: Tetanus vaccination status reviewed: last tetanus booster within 10 years. - Influenza: Administered today - Pneumovax: Up to date - Prevnar: Up to date - HPV: Not applicable - Zostavax vaccine:  Discussed at visit today  SCREENING: -Mammogram: Ordered today  - Colonoscopy: Up to date  - Bone Density: Ordered today  -Hearing Test: Not applicable  -Spirometry: Not applicable   PATIENT COUNSELING:   Advised to take 1 mg of folate supplement per day if capable of pregnancy.   Sexuality: Discussed sexually transmitted diseases, partner selection, use of condoms, avoidance of unintended pregnancy  and contraceptive alternatives.   Advised to avoid cigarette smoking.  I discussed with the patient that  most people either abstain from alcohol or drink within safe limits (<=14/week and <=4 drinks/occasion for males, <=7/weeks and <= 3 drinks/occasion for females) and that the risk for alcohol disorders and other health effects rises proportionally with the number of drinks per week and how often a drinker exceeds daily limits.  Discussed cessation/primary prevention of drug use and availability of treatment for abuse.   Diet: Encouraged to adjust caloric intake to maintain  or achieve ideal body weight, to reduce intake of dietary saturated fat and total fat, to limit sodium intake by avoiding high sodium foods and not  adding table salt, and to maintain adequate dietary potassium and calcium preferably from fresh fruits, vegetables, and low-fat dairy products.    stressed the importance of regular exercise  Injury prevention: Discussed safety belts, safety helmets, smoke detector, smoking near bedding or upholstery.   Dental health: Discussed importance of regular tooth brushing, flossing, and dental visits.    NEXT PREVENTATIVE PHYSICAL DUE IN 1 YEAR. Return in about 6 months (around 07/10/2022) for HTN, HLD, DM2 FU.

## 2022-01-08 ENCOUNTER — Encounter: Payer: Self-pay | Admitting: Nurse Practitioner

## 2022-01-08 ENCOUNTER — Ambulatory Visit (INDEPENDENT_AMBULATORY_CARE_PROVIDER_SITE_OTHER): Payer: Medicare PPO | Admitting: Nurse Practitioner

## 2022-01-08 VITALS — BP 133/71 | HR 57 | Temp 97.7°F | Ht 67.0 in | Wt 140.9 lb

## 2022-01-08 DIAGNOSIS — Z1231 Encounter for screening mammogram for malignant neoplasm of breast: Secondary | ICD-10-CM | POA: Diagnosis not present

## 2022-01-08 DIAGNOSIS — F325 Major depressive disorder, single episode, in full remission: Secondary | ICD-10-CM | POA: Diagnosis not present

## 2022-01-08 DIAGNOSIS — I10 Essential (primary) hypertension: Secondary | ICD-10-CM

## 2022-01-08 DIAGNOSIS — M8580 Other specified disorders of bone density and structure, unspecified site: Secondary | ICD-10-CM | POA: Diagnosis not present

## 2022-01-08 DIAGNOSIS — Z23 Encounter for immunization: Secondary | ICD-10-CM

## 2022-01-08 DIAGNOSIS — E7800 Pure hypercholesterolemia, unspecified: Secondary | ICD-10-CM

## 2022-01-08 DIAGNOSIS — Z0000 Encounter for general adult medical examination without abnormal findings: Secondary | ICD-10-CM | POA: Diagnosis not present

## 2022-01-08 LAB — URINALYSIS, ROUTINE W REFLEX MICROSCOPIC
Bilirubin, UA: NEGATIVE
Glucose, UA: NEGATIVE
Leukocytes,UA: NEGATIVE
Nitrite, UA: NEGATIVE
Specific Gravity, UA: 1.02 (ref 1.005–1.030)
Urobilinogen, Ur: 1 mg/dL (ref 0.2–1.0)
pH, UA: 7 (ref 5.0–7.5)

## 2022-01-08 LAB — MICROSCOPIC EXAMINATION: Bacteria, UA: NONE SEEN

## 2022-01-08 MED ORDER — OMEPRAZOLE 20 MG PO CPDR: 20.0000 mg | DELAYED_RELEASE_CAPSULE | Freq: Every day | ORAL | 1 refills | Status: DC

## 2022-01-08 MED ORDER — FLUOXETINE HCL 20 MG PO CAPS: 20.0000 mg | ORAL_CAPSULE | Freq: Every day | ORAL | 1 refills | Status: DC

## 2022-01-08 MED ORDER — AMLODIPINE BESYLATE 5 MG PO TABS: 5.0000 mg | ORAL_TABLET | Freq: Every day | ORAL | 1 refills | Status: DC

## 2022-01-08 NOTE — Assessment & Plan Note (Addendum)
Chronic.  Controlled.  Continue with current medication regimen on Amlodipine '5mg'$  daily and HCTZ PRN. Rarely uses the HCTZ. Refills sent today.  Labs ordered today.  Return to clinic in 6 months for reevaluation.  Call sooner if concerns arise.

## 2022-01-08 NOTE — Assessment & Plan Note (Signed)
Chronic.  Controlled.  Continue with current medication regimen of Crestor 20mg daily.  Refills sent today.  Labs ordered today.  Return to clinic in 6 months for reevaluation.  Call sooner if concerns arise.    

## 2022-01-08 NOTE — Assessment & Plan Note (Signed)
Chronic.  Not as well controlled due to death in the family.  However, she is trying to move past it.  Continue with current medication regimen of Prozac '20mg'$ .  Refills sent today. Labs ordered today.  Return to clinic in 6 months for reevaluation.  Call sooner if concerns arise.

## 2022-01-08 NOTE — Assessment & Plan Note (Signed)
Last bone density in 12/2020.  Continue with vitamin D plus calcium.  Will repeat next year.

## 2022-01-09 LAB — COMPREHENSIVE METABOLIC PANEL
ALT: 16 IU/L (ref 0–32)
AST: 24 IU/L (ref 0–40)
Albumin/Globulin Ratio: 1.8 (ref 1.2–2.2)
Albumin: 4.2 g/dL (ref 3.8–4.8)
Alkaline Phosphatase: 60 IU/L (ref 44–121)
BUN/Creatinine Ratio: 19 (ref 12–28)
BUN: 17 mg/dL (ref 8–27)
Bilirubin Total: 0.5 mg/dL (ref 0.0–1.2)
CO2: 27 mmol/L (ref 20–29)
Calcium: 9.5 mg/dL (ref 8.7–10.3)
Chloride: 101 mmol/L (ref 96–106)
Creatinine, Ser: 0.9 mg/dL (ref 0.57–1.00)
Globulin, Total: 2.4 g/dL (ref 1.5–4.5)
Glucose: 90 mg/dL (ref 70–99)
Potassium: 3.8 mmol/L (ref 3.5–5.2)
Sodium: 142 mmol/L (ref 134–144)
Total Protein: 6.6 g/dL (ref 6.0–8.5)
eGFR: 66 mL/min/{1.73_m2} (ref >59)

## 2022-01-09 LAB — CBC WITH DIFFERENTIAL/PLATELET
Basophils Absolute: 0 10*3/uL (ref 0.0–0.2)
Basos: 1 %
EOS (ABSOLUTE): 0.1 10*3/uL (ref 0.0–0.4)
Eos: 2 %
Hematocrit: 40.7 % (ref 34.0–46.6)
Hemoglobin: 12.9 g/dL (ref 11.1–15.9)
Immature Grans (Abs): 0 10*3/uL (ref 0.0–0.1)
Immature Granulocytes: 0 %
Lymphocytes Absolute: 1.2 10*3/uL (ref 0.7–3.1)
Lymphs: 40 %
MCH: 29.3 pg (ref 26.6–33.0)
MCHC: 31.7 g/dL (ref 31.5–35.7)
MCV: 92 fL (ref 79–97)
Monocytes Absolute: 0.5 10*3/uL (ref 0.1–0.9)
Monocytes: 15 %
Neutrophils Absolute: 1.3 10*3/uL — ABNORMAL LOW (ref 1.4–7.0)
Neutrophils: 42 %
Platelets: 185 10*3/uL (ref 150–450)
RBC: 4.41 x10E6/uL (ref 3.77–5.28)
RDW: 12.5 % (ref 11.7–15.4)
WBC: 3.1 10*3/uL — ABNORMAL LOW (ref 3.4–10.8)

## 2022-01-09 LAB — LIPID PANEL
Chol/HDL Ratio: 2.4 ratio (ref 0.0–4.4)
Cholesterol, Total: 170 mg/dL (ref 100–199)
HDL: 70 mg/dL (ref >39)
LDL Chol Calc (NIH): 85 mg/dL (ref 0–99)
Triglycerides: 82 mg/dL (ref 0–149)
VLDL Cholesterol Cal: 15 mg/dL (ref 5–40)

## 2022-01-09 LAB — TSH: TSH: 1.59 u[IU]/mL (ref 0.450–4.500)

## 2022-01-09 NOTE — Progress Notes (Signed)
HI Connie Morrison. It was nice to see you yesterday.  Your lab work looks good.  No concerns at this time. Continue with your current medication regimen.  Follow up as discussed.  Please let me know if you have any questions.

## 2022-02-10 ENCOUNTER — Ambulatory Visit
Admission: RE | Admit: 2022-02-10 | Discharge: 2022-02-10 | Disposition: A | Discharge status: Home / Self Care | Payer: Medicare PPO | Source: Ambulatory Visit | Attending: Nurse Practitioner | Admitting: Nurse Practitioner

## 2022-02-10 DIAGNOSIS — Z1231 Encounter for screening mammogram for malignant neoplasm of breast: Secondary | ICD-10-CM | POA: Diagnosis not present

## 2022-02-12 NOTE — Progress Notes (Signed)
Please let patient know her Mammogram did not show any evidence of a malignancy.  The recommendation is to repeat the Mammogram in 1 year.  

## 2022-02-23 DIAGNOSIS — G5792 Unspecified mononeuropathy of left lower limb: Secondary | ICD-10-CM | POA: Diagnosis not present

## 2022-03-27 DIAGNOSIS — H353131 Nonexudative age-related macular degeneration, bilateral, early dry stage: Secondary | ICD-10-CM | POA: Diagnosis not present

## 2022-05-12 ENCOUNTER — Ambulatory Visit: Payer: Self-pay | Admitting: *Deleted

## 2022-05-12 NOTE — Telephone Encounter (Signed)
  Chief Complaint: upper abdominal pain  Symptoms: upper abdominal pain sharp comes and goes. Lasting few seconds. Started upon awakening this am . Able to tolerate smoothie this am.  Frequency: this am  Pertinent Negatives: Patient denies chest pain no difficulty breathing no fever, no sweating no N/V. Disposition: '[]'$ ED /'[x]'$ Urgent Care (no appt availability in office) / '[]'$ Appointment(In office/virtual)/ '[]'$  [facility]/ '[]'$ Home Care/ '[]'$ Refused Recommended Disposition /'[]'$ [facility]/ '[]'$  Follow-up with PCP Additional Notes:    Recommended UC /ED if pain worsens or becomes constant . No available appt until 05/14/22. Please advise.       Reason for Disposition  [1] MODERATE pain (e.g., interferes with normal activities) AND [2] comes and goes (cramps) AND [3] present > 24 hours  (Exception: Pain with Vomiting or Diarrhea - see that Guideline.)  Answer Assessment - Initial Assessment Questions 1. LOCATION: "Where does it hurt?"      Upper abdomen  2. RADIATION: "Does the pain shoot anywhere else?" (e.g., chest, back)     no 3. ONSET: "When did the pain begin?" (e.g., minutes, hours or days ago)      Today when woke up  4. SUDDEN: "Gradual or sudden onset?"     Sudden sharp pains 5. PATTERN "Does the pain come and go, or is it constant?"    - If it comes and goes: "How long does it last?" "Do you have pain now?"     (Note: Comes and goes means the pain is intermittent. It goes away completely between bouts.)    - If constant: "Is it getting better, staying the same, or getting worse?"      (Note: Constant means the pain never goes away completely; most serious pain is constant and gets worse.)      Comes and goes 6. SEVERITY: "How bad is the pain?"  (e.g., Scale 1-10; mild, moderate, or severe)    - MILD (1-3): Doesn't interfere with normal activities, abdomen soft and not tender to touch..     - MODERATE (4-7): Interferes with normal activities or awakens from  sleep, abdomen tender to touch.     - SEVERE (8-10): Excruciating pain, doubled over, unable to do any normal activities.       Upper abdomen not tender to touch  7. RECURRENT SYMPTOM: "Have you ever had this type of stomach pain before?" If Yes, ask: "When was the last time?" and "What happened that time?"      na 8. AGGRAVATING FACTORS: "Does anything seem to cause this pain?" (e.g., foods, stress, alcohol)     Unsure  9. CARDIAC SYMPTOMS: "Do you have any of the following symptoms: chest pain, difficulty breathing, sweating, nausea?"     no 10. OTHER SYMPTOMS: "Do you have any other symptoms?" (e.g., back pain, diarrhea, fever, urination pain, vomiting)       No  11. PREGNANCY: "Is there any chance you are pregnant?" "When was your last menstrual period?"       na  Protocols used: Abdominal Pain - Upper-A-AH

## 2022-06-05 DIAGNOSIS — D2271 Melanocytic nevi of right lower limb, including hip: Secondary | ICD-10-CM | POA: Diagnosis not present

## 2022-06-05 DIAGNOSIS — D225 Melanocytic nevi of trunk: Secondary | ICD-10-CM | POA: Diagnosis not present

## 2022-06-05 DIAGNOSIS — L821 Other seborrheic keratosis: Secondary | ICD-10-CM | POA: Diagnosis not present

## 2022-06-05 DIAGNOSIS — D2272 Melanocytic nevi of left lower limb, including hip: Secondary | ICD-10-CM | POA: Diagnosis not present

## 2022-06-05 DIAGNOSIS — D2262 Melanocytic nevi of left upper limb, including shoulder: Secondary | ICD-10-CM | POA: Diagnosis not present

## 2022-06-05 DIAGNOSIS — D2261 Melanocytic nevi of right upper limb, including shoulder: Secondary | ICD-10-CM | POA: Diagnosis not present

## 2022-07-09 NOTE — Progress Notes (Signed)
BP 101/62   Pulse (!) 59   Temp 97.9 F (36.6 C) (Oral)   Ht 5\' 7"  (1.702 m)   Wt 145 lb 4.8 oz (65.9 kg)   LMP 06/30/1991 (Approximate)   SpO2 100%   BMI 22.76 kg/m    Subjective:    Patient ID: Connie Morrison, female    DOB: 1945/05/19, 77 y.o.   MRN: [id number]  HPI: Connie Morrison is a 77 y.o. female  Chief Complaint  Patient presents with   Hypertension   Hyperlipidemia   Diabetes   HYPERTENSION / HYPERLIPIDEMIA Satisfied with current treatment? yes Duration of hypertension: chronic BP monitoring frequency: not checking BP medication side effects: no Past BP meds: amlodipine Duration of hyperlipidemia: chronic Cholesterol medication side effects: no Cholesterol supplements: none Past cholesterol medications: Crestor Medication compliance: excellent compliance Aspirin: yes Recent stressors: no Recurrent headaches: no Visual changes: no Palpitations: no Dyspnea: no Chest pain: no Lower extremity edema: no Dizzy/lightheaded: no  DEPRESSION Patient states she feels like the Fluoxetine is working well for her.  Denies concerns at visit today.  Mood status: controlled Satisfied with current treatment?: yes Symptom severity: mild  Duration of current treatment : chronic Side effects: no Medication compliance: excellent compliance Psychotherapy/counseling: no  Previous psychiatric medications: prozac Depressed mood: no Anxious mood: no Anhedonia: no Significant weight loss or gain: no Insomnia: no  Fatigue: no Feelings of worthlessness or guilt: no Impaired concentration/indecisiveness: no Suicidal ideations: no Hopelessness: no Crying spells: no    07/10/2022    8:22 AM 01/08/2022    8:28 AM 10/22/2021    9:55 AM 09/15/2021   12:29 PM 06/27/2021    9:53 AM  Depression screen PHQ 2/9  Decreased Interest 0 1 0 0 0  Down, Depressed, Hopeless 1 1 0 0 0  PHQ - 2 Score 1 2 0 0 0  Altered sleeping 1 0 0  0  Tired, decreased energy 0 0 0  0  Change in  appetite 1 1 2   0  Feeling bad or failure about yourself  0 0 0  0  Trouble concentrating 0 0 0  1  Moving slowly or fidgety/restless 0 0 0  0  Suicidal thoughts 0 0 0  0  PHQ-9 Score 3 3 2  1   Difficult doing work/chores  Not difficult at all Not difficult at all  Not difficult at all    Relevant past medical, surgical, family and social history reviewed and updated as indicated. Interim medical history since our last visit reviewed. Allergies and medications reviewed and updated.  Review of Systems  Constitutional: Negative.   Respiratory: Negative.    Cardiovascular: Negative.   Gastrointestinal: Negative.   Musculoskeletal: Negative.   Psychiatric/Behavioral: Negative.  Negative for dysphoric mood. The patient is not nervous/anxious.     Per HPI unless specifically indicated above     Objective:    BP 101/62   Pulse (!) 59   Temp 97.9 F (36.6 C) (Oral)   Ht 5\' 7"  (1.702 m)   Wt 145 lb 4.8 oz (65.9 kg)   LMP 06/30/1991 (Approximate)   SpO2 100%   BMI 22.76 kg/m   Wt Readings from Last 3 Encounters:  07/10/22 145 lb 4.8 oz (65.9 kg)  01/08/22 140 lb 14.4 oz (63.9 kg)  10/29/21 140 lb 14.4 oz (63.9 kg)    Physical Exam Vitals and nursing note reviewed.  Constitutional:      General: She is not in acute distress.  Appearance: Normal appearance. She is not ill-appearing, toxic-appearing or diaphoretic.  HENT:     Head: Normocephalic and atraumatic.     Right Ear: External ear normal.     Left Ear: External ear normal.     Nose: Nose normal.     Mouth/Throat:     Mouth: Mucous membranes are moist.     Pharynx: Oropharynx is clear.  Eyes:     General: No scleral icterus.       Right eye: No discharge.        Left eye: No discharge.     Extraocular Movements: Extraocular movements intact.     Conjunctiva/sclera: Conjunctivae normal.     Pupils: Pupils are equal, round, and reactive to light.  Cardiovascular:     Rate and Rhythm: Normal rate and regular  rhythm.     Pulses: Normal pulses.     Heart sounds: Normal heart sounds. No murmur heard.    No friction rub. No gallop.  Pulmonary:     Effort: Pulmonary effort is normal. No respiratory distress.     Breath sounds: Normal breath sounds. No stridor. No wheezing, rhonchi or rales.  Chest:     Chest wall: No tenderness.  Musculoskeletal:        General: Normal range of motion.     Cervical back: Normal range of motion and neck supple.  Skin:    General: Skin is warm and dry.     Capillary Refill: Capillary refill takes less than 2 seconds.     Coloration: Skin is not jaundiced or pale.     Findings: No bruising, erythema, lesion or rash.  Neurological:     General: No focal deficit present.     Mental Status: She is alert and oriented to person, place, and time. Mental status is at baseline.  Psychiatric:        Mood and Affect: Mood normal.        Behavior: Behavior normal.        Thought Content: Thought content normal.        Judgment: Judgment normal.     Results for orders placed or performed in visit on 01/08/22  Microscopic Examination   Urine  Result Value Ref Range   WBC, UA 0-5 0 - 5 /hpf   RBC, Urine 3-10 (A) 0 - 2 /hpf   Epithelial Cells (non renal) 0-10 0 - 10 /hpf   Casts Present (A) None seen /lpf   Cast Type Hyaline casts N/A   Mucus, UA Present (A) Not Estab.   Bacteria, UA None seen None seen/Few  CBC with Differential/Platelet  Result Value Ref Range   WBC 3.1 (L) 3.4 - 10.8 x10E3/uL   RBC 4.41 3.77 - 5.28 x10E6/uL   Hemoglobin 12.9 11.1 - 15.9 g/dL   Hematocrit 16.140.7 09.634.0 - 46.6 %   MCV 92 79 - 97 fL   MCH 29.3 26.6 - 33.0 pg   MCHC 31.7 31.5 - 35.7 g/dL   RDW 04.512.5 40.911.7 - 81.115.4 %   Platelets 185 150 - 450 x10E3/uL   Neutrophils 42 Not Estab. %   Lymphs 40 Not Estab. %   Monocytes 15 Not Estab. %   Eos 2 Not Estab. %   Basos 1 Not Estab. %   Neutrophils Absolute 1.3 (L) 1.4 - 7.0 x10E3/uL   Lymphocytes Absolute 1.2 0.7 - 3.1 x10E3/uL    Monocytes Absolute 0.5 0.1 - 0.9 x10E3/uL   EOS (ABSOLUTE) 0.1 0.0 - 0.4  x10E3/uL   Basophils Absolute 0.0 0.0 - 0.2 x10E3/uL   Immature Granulocytes 0 Not Estab. %   Immature Grans (Abs) 0.0 0.0 - 0.1 x10E3/uL  Comprehensive metabolic panel  Result Value Ref Range   Glucose 90 70 - 99 mg/dL   BUN 17 8 - 27 mg/dL   Creatinine, Ser 1.61 0.57 - 1.00 mg/dL   eGFR 66 >09 UE/AVW/0.98   BUN/Creatinine Ratio 19 12 - 28   Sodium 142 134 - 144 mmol/L   Potassium 3.8 3.5 - 5.2 mmol/L   Chloride 101 96 - 106 mmol/L   CO2 27 20 - 29 mmol/L   Calcium 9.5 8.7 - 10.3 mg/dL   Total Protein 6.6 6.0 - 8.5 g/dL   Albumin 4.2 3.8 - 4.8 g/dL   Globulin, Total 2.4 1.5 - 4.5 g/dL   Albumin/Globulin Ratio 1.8 1.2 - 2.2   Bilirubin Total 0.5 0.0 - 1.2 mg/dL   Alkaline Phosphatase 60 44 - 121 IU/L   AST 24 0 - 40 IU/L   ALT 16 0 - 32 IU/L  Lipid panel  Result Value Ref Range   Cholesterol, Total 170 100 - 199 mg/dL   Triglycerides 82 0 - 149 mg/dL   HDL 70 >11 mg/dL   VLDL Cholesterol Cal 15 5 - 40 mg/dL   LDL Chol Calc (NIH) 85 0 - 99 mg/dL   Chol/HDL Ratio 2.4 0.0 - 4.4 ratio  TSH  Result Value Ref Range   TSH 1.590 0.450 - 4.500 uIU/mL  Urinalysis, Routine w reflex microscopic  Result Value Ref Range   Specific Gravity, UA 1.020 1.005 - 1.030   pH, UA 7.0 5.0 - 7.5   Color, UA Yellow Yellow   Appearance Ur Clear Clear   Leukocytes,UA Negative Negative   Protein,UA 2+ (A) Negative/Trace   Glucose, UA Negative Negative   Ketones, UA Trace (A) Negative   RBC, UA 3+ (A) Negative   Bilirubin, UA Negative Negative   Urobilinogen, Ur 1.0 0.2 - 1.0 mg/dL   Nitrite, UA Negative Negative   Microscopic Examination See below:       Assessment & Plan:   Problem List Items Addressed This Visit       Cardiovascular and Mediastinum   Hypertension - Primary    Chronic.  Controlled.  Continue with current medication regimen on Amlodipine 5mg  daily and HCTZ PRN. Rarely uses the HCTZ. Refills sent  today of Amlodipine but didn't need a refill of the HCTZ.  Labs ordered today.  Return to clinic in 6 months for reevaluation.  Call sooner if concerns arise.        Relevant Medications   amLODipine (NORVASC) 5 MG tablet   rosuvastatin (CRESTOR) 20 MG tablet   Other Relevant Orders   Comp Met (CMET)     Other   Acute anxiety    Chronic.  Controlled.  Continue with current medication regimen of Fluoxetine daily.  Refills sent today.  Labs ordered today.  Return to clinic in 6 months for reevaluation.  Call sooner if concerns arise.       Relevant Medications   FLUoxetine (PROZAC) 20 MG capsule   Hyperlipidemia    Chronic.  Controlled.  Continue with current medication regimen of Crestor 20mg  daily.  Refills sent today.  Labs ordered today.  Return to clinic in 6 months for reevaluation.  Call sooner if concerns arise.        Relevant Medications   amLODipine (NORVASC) 5 MG tablet  rosuvastatin (CRESTOR) 20 MG tablet   Other Relevant Orders   Lipid Profile   Depression, major, single episode, complete remission    Chronic.  Controlled.  Continue with current medication regimen of Fluoxetine daily.  Refills sent today.  Labs ordered today.  Return to clinic in 6 months for reevaluation.  Call sooner if concerns arise.        Relevant Medications   FLUoxetine (PROZAC) 20 MG capsule     Follow up plan: Return in about 6 months (around 01/09/2023) for Physical and Fasting labs.

## 2022-07-10 ENCOUNTER — Ambulatory Visit: Payer: Medicare PPO | Admitting: Nurse Practitioner

## 2022-07-10 ENCOUNTER — Encounter: Payer: Self-pay | Admitting: Nurse Practitioner

## 2022-07-10 VITALS — BP 101/62 | HR 59 | Temp 97.9°F | Ht 67.0 in | Wt 145.3 lb

## 2022-07-10 DIAGNOSIS — E7800 Pure hypercholesterolemia, unspecified: Secondary | ICD-10-CM | POA: Diagnosis not present

## 2022-07-10 DIAGNOSIS — F419 Anxiety disorder, unspecified: Secondary | ICD-10-CM

## 2022-07-10 DIAGNOSIS — F325 Major depressive disorder, single episode, in full remission: Secondary | ICD-10-CM | POA: Diagnosis not present

## 2022-07-10 DIAGNOSIS — I10 Essential (primary) hypertension: Secondary | ICD-10-CM

## 2022-07-10 MED ORDER — ROSUVASTATIN CALCIUM 20 MG PO TABS: 20.0000 mg | ORAL_TABLET | Freq: Every day | ORAL | 1 refills | Status: DC

## 2022-07-10 MED ORDER — FLUOXETINE HCL 20 MG PO CAPS: 20.0000 mg | ORAL_CAPSULE | Freq: Every day | ORAL | 1 refills | Status: DC

## 2022-07-10 MED ORDER — AMLODIPINE BESYLATE 5 MG PO TABS: 5.0000 mg | ORAL_TABLET | Freq: Every day | ORAL | 1 refills | Status: DC

## 2022-07-10 NOTE — Assessment & Plan Note (Signed)
Chronic.  Controlled.  Continue with current medication regimen of Crestor 20mg daily.  Refills sent today.  Labs ordered today.  Return to clinic in 6 months for reevaluation.  Call sooner if concerns arise.    

## 2022-07-10 NOTE — Assessment & Plan Note (Signed)
Chronic.  Controlled.  Continue with current medication regimen of Fluoxetine daily.  Refills sent today.  Labs ordered today.  Return to clinic in 6 months for reevaluation.  Call sooner if concerns arise.   

## 2022-07-10 NOTE — Assessment & Plan Note (Signed)
Chronic.  Controlled.  Continue with current medication regimen on Amlodipine 5mg  daily and HCTZ PRN. Rarely uses the HCTZ. Refills sent today of Amlodipine but didn't need a refill of the HCTZ.  Labs ordered today.  Return to clinic in 6 months for reevaluation.  Call sooner if concerns arise.

## 2022-07-11 LAB — LIPID PANEL
Chol/HDL Ratio: 2 ratio (ref 0.0–4.4)
Cholesterol, Total: 145 mg/dL (ref 100–199)
HDL: 73 mg/dL (ref >39)
LDL Chol Calc (NIH): 56 mg/dL (ref 0–99)
Triglycerides: 83 mg/dL (ref 0–149)
VLDL Cholesterol Cal: 16 mg/dL (ref 5–40)

## 2022-07-11 LAB — COMPREHENSIVE METABOLIC PANEL
ALT: 15 IU/L (ref 0–32)
AST: 28 IU/L (ref 0–40)
Albumin/Globulin Ratio: 1.8 (ref 1.2–2.2)
Albumin: 4.2 g/dL (ref 3.8–4.8)
Alkaline Phosphatase: 55 IU/L (ref 44–121)
BUN/Creatinine Ratio: 16 (ref 12–28)
BUN: 14 mg/dL (ref 8–27)
Bilirubin Total: 0.3 mg/dL (ref 0.0–1.2)
CO2: 26 mmol/L (ref 20–29)
Calcium: 9.4 mg/dL (ref 8.7–10.3)
Chloride: 105 mmol/L (ref 96–106)
Creatinine, Ser: 0.89 mg/dL (ref 0.57–1.00)
Globulin, Total: 2.3 g/dL (ref 1.5–4.5)
Glucose: 99 mg/dL (ref 70–99)
Potassium: 4.1 mmol/L (ref 3.5–5.2)
Sodium: 146 mmol/L — ABNORMAL HIGH (ref 134–144)
Total Protein: 6.5 g/dL (ref 6.0–8.5)
eGFR: 67 mL/min/{1.73_m2} (ref >59)

## 2022-07-13 NOTE — Progress Notes (Signed)
HI Annice Pih. It was nice to see you last week.  Your lab work looks good.  No concerns at this time. Continue with your current medication regimen.  Follow up as discussed.  Please let me know if you have any questions.

## 2022-07-20 DIAGNOSIS — M25562 Pain in left knee: Secondary | ICD-10-CM | POA: Diagnosis not present

## 2022-07-20 DIAGNOSIS — M2392 Unspecified internal derangement of left knee: Secondary | ICD-10-CM | POA: Diagnosis not present

## 2022-09-28 ENCOUNTER — Ambulatory Visit (INDEPENDENT_AMBULATORY_CARE_PROVIDER_SITE_OTHER): Payer: Medicare PPO

## 2022-09-28 VITALS — BP 110/60 | Ht 67.0 in | Wt 143.2 lb

## 2022-09-28 DIAGNOSIS — Z0000 Encounter for general adult medical examination without abnormal findings: Secondary | ICD-10-CM

## 2022-09-28 NOTE — Progress Notes (Signed)
Subjective:   Connie Morrison is a 77 y.o. female who presents for Medicare Annual (Subsequent) preventive examination.  Visit Complete: In person   Review of Systems     Cardiac Risk Factors include: advanced age (>77men, >86 women);hypertension;family history of premature cardiovascular disease;dyslipidemia     Objective:    Today's Vitals   09/28/22 0805  BP: 110/60  Weight: 143 lb 3.2 oz (65 kg)  Height: 5\' 7"  (1.702 m)   Body mass index is 22.43 kg/m.     09/28/2022    8:13 AM 09/15/2021   12:09 PM 09/13/2020    1:45 PM 06/08/2019    2:44 PM 11/23/2018    7:55 AM 11/02/2018    7:32 AM 11/02/2017   10:12 AM  Advanced Directives  Does Patient Have a Medical Advance Directive? No No Yes Yes Yes Yes Yes  Type of Science writer of [place];Living will Living will;Healthcare Power of [employer] Power of [place];Living will Healthcare Power of [place];Living will Healthcare Power of [place];Living will  Does patient want to make changes to medical advance directive?     No - Patient declined No - Patient declined   Copy of Healthcare Power of Attorney in Chart?  No - copy requested No - copy requested No - copy requested Yes - validated most recent copy scanned in chart (See row information) No - copy requested   Would patient like information on creating a medical advance directive? No - Patient declined No - Patient declined         Current Medications (verified) Outpatient Encounter Medications as of 09/28/2022  Medication Sig   amLODipine (NORVASC) 5 MG tablet Take 1 tablet (5 mg total) by mouth daily.   diphenhydramine-acetaminophen (TYLENOL PM) 25-500 MG TABS tablet Take 0.5 tablets by mouth at bedtime as needed.   FLUoxetine (PROZAC) 20 MG capsule Take 1 capsule (20 mg total) by mouth daily.   hydrochlorothiazide (HYDRODIURIL) 25 MG tablet Take 0.5 tablets (12.5 mg total) by mouth as needed.   Multiple Vitamin  (MULTIVITAMIN) tablet Take 1 tablet by mouth daily.   rosuvastatin (CRESTOR) 20 MG tablet Take 1 tablet (20 mg total) by mouth daily.   tretinoin (RETIN-A) 0.1 % cream APPLY PEA SIZE AMOUNT TO ENTIRE DRY FACE NIGHTLY   No facility-administered encounter medications on file as of 09/28/2022.    Allergies (verified) Cymbalta [duloxetine hcl], Other, and Sulfa antibiotics   History: Past Medical History:  Diagnosis Date   Allergic rhinitis    Anginal pain (HCC)    Anxiety    Complication of anesthesia    itching after anesthesia (50 yrs ago)   Depression    Dyspnea    (pt denies)   GERD (gastroesophageal reflux disease)    Hematuria    Hyperlipidemia    Hypertension    (pt denies)   Menopause    Microscopic hematuria    Vaginal atrophy    Wears dentures    lower partial   Past Surgical History:  Procedure Laterality Date   BREAST SURGERY     cyst removed   CATARACT EXTRACTION W/PHACO Left 11/02/2018   Procedure: CATARACT EXTRACTION PHACO AND INTRAOCULAR LENS PLACEMENT (IOC)  LEFT;  Surgeon: Lockie Mola, MD;  Location: [facility];  Service: Ophthalmology;  Laterality: Left;   CATARACT EXTRACTION W/PHACO Right 11/23/2018   Procedure: CATARACT EXTRACTION PHACO AND INTRAOCULAR LENS PLACEMENT (IOC) - right  00:50.6  17.1;  Surgeon: Lockie Mola,  MD;  Location: [facility];  Service: Ophthalmology;  Laterality: Right;   COLONOSCOPY     COLONOSCOPY WITH PROPOFOL N/A 11/02/2017   Procedure: COLONOSCOPY WITH PROPOFOL;  Surgeon: Christena Deem, MD;  Location: [facility];  Service: Endoscopy;  Laterality: N/A;   TONSILLECTOMY     TUMOR EXCISION Right    kidney   Family History  Problem Relation Age of Onset   Heart disease Father    Cancer Sister        lung   Cancer Sister        lung   COPD Sister    Stroke Maternal Grandmother    Cancer Maternal Grandfather        lung   Cancer Brother        colon   Diabetes Brother    Heart  disease Brother    Breast cancer Neg Hx    Social History   Socioeconomic History   Marital status: Married    Spouse name: Not on file   Number of children: Not on file   Years of education: Not on file   Highest education level: Not on file  Occupational History   Occupation: retired   Tobacco Use   Smoking status: Former    Types: Cigarettes    Quit date: 1980    Years since quitting: 44.5   Smokeless tobacco: Never  Vaping Use   Vaping Use: Never used  Substance and Sexual Activity   Alcohol use: No    Alcohol/week: 0.0 standard drinks of alcohol   Drug use: No   Sexual activity: Yes  Other Topics Concern   Not on file  Social History Narrative   Not on file   Social Determinants of Health   Financial Resource Strain: Low Risk  (09/28/2022)   Overall Financial Resource Strain (CARDIA)    Difficulty of Paying Living Expenses: Not hard at all  Food Insecurity: No Food Insecurity (09/28/2022)   Hunger Vital Sign    Worried About Running Out of Food in the Last Year: Never true    Ran Out of Food in the Last Year: Never true  Transportation Needs: No Transportation Needs (09/28/2022)   PRAPARE - Administrator, Civil Service (Medical): No    Lack of Transportation (Non-Medical): No  Physical Activity: Sufficiently Active (09/28/2022)   Exercise Vital Sign    Days of Exercise per Week: 4 days    Minutes of Exercise per Session: 60 min  Stress: No Stress Concern Present (09/28/2022)   [employer] of Occupational Health - Occupational Stress Questionnaire    Feeling of Stress : Not at all  Social Connections: Moderately Integrated (09/28/2022)   Social Connection and Isolation Panel [NHANES]    Frequency of Communication with Friends and Family: More than three times a week    Frequency of Social Gatherings with Friends and Family: Once a week    Attends Religious Services: More than 4 times per year    Active Member of [employer] or Organizations: No    Attends  Engineer, structural: Never    Marital Status: Married    Tobacco Counseling Counseling given: Not Answered   Clinical Intake:  Pre-visit preparation completed: Yes  Pain : No/denies pain     Nutritional Risks: None Diabetes: No  How often do you need to have someone help you when you read instructions, pamphlets, or other written materials from your doctor or pharmacy?: 1 - Never  Interpreter  Needed?: No  Information entered by :: Kennedy Bucker, LPN   Activities of Daily Living    09/28/2022    8:13 AM  In your present state of health, do you have any difficulty performing the following activities:  Hearing? 0  Vision? 0  Difficulty concentrating or making decisions? 0  Walking or climbing stairs? 0  Dressing or bathing? 0  Doing errands, shopping? 0  Preparing Food and eating ? N  Using the Toilet? N  In the past six months, have you accidently leaked urine? N  Do you have problems with loss of bowel control? N  Managing your Medications? N  Managing your Finances? N  Housekeeping or managing your Housekeeping? N    Patient Care Team: Larae Grooms, NP as PCP - General  Indicate any recent Medical Services you may have received from other than [facility] providers in the past year (date may be approximate).     Assessment:   This is a routine wellness examination for Roann.  Hearing/Vision screen Hearing Screening - Comments:: No aids Vision Screening - Comments:: No glasses- Dr.Brasington  Dietary issues and exercise activities discussed:     Goals Addressed             This Visit's Progress    DIET - EAT MORE FRUITS AND VEGETABLES         Depression Screen    09/28/2022    8:12 AM 07/10/2022    8:22 AM 01/08/2022    8:28 AM 10/22/2021    9:55 AM 09/15/2021   12:29 PM 06/27/2021    9:53 AM 12/27/2020    8:10 AM  PHQ 2/9 Scores  PHQ - 2 Score 0 1 2 0 0 0 0  PHQ- 9 Score 0 3 3 2  1  0    Fall Risk    09/28/2022    8:13 AM  07/10/2022    8:22 AM 01/08/2022    8:27 AM 10/22/2021    9:52 AM 09/15/2021   12:09 PM  Fall Risk   Falls in the past year? 0 0 0 0 0  Number falls in past yr: 0 0 0 0 0  Injury with Fall? 0 0 0 0   Risk for fall due to : No Fall Risks No Fall Risks No Fall Risks No Fall Risks   Follow up Falls prevention discussed;Falls evaluation completed Falls evaluation completed Falls evaluation completed Falls evaluation completed Falls evaluation completed;Education provided;Falls prevention discussed    MEDICARE RISK AT HOME:   TIMED UP AND GO:  Was the test performed?  Yes  Length of time to ambulate 10 feet: 4 sec Gait steady and fast without use of assistive device    Cognitive Function:        09/28/2022    8:18 AM 09/15/2021   12:10 PM 09/13/2020    1:47 PM  6CIT Screen  What Year? 0 points 0 points 0 points  What month? 0 points 0 points 0 points  What time? 0 points 0 points 0 points  Count back from 20 0 points 0 points 0 points  Months in reverse 0 points 0 points 0 points  Repeat phrase 0 points 0 points 2 points  Total Score 0 points 0 points 2 points    Immunizations Immunization History  Administered Date(s) Administered   Fluad Quad(high Dose 65+) 12/01/2018, 12/15/2019, 12/27/2020, 01/08/2022   Influenza, High Dose Seasonal PF 12/27/2015, 01/10/2018   Influenza-Unspecified 12/23/2012, 12/27/2013   PFIZER(Purple Top)SARS-COV-2 Vaccination  05/25/2019, 06/21/2019   Pneumococcal Conjugate-13 07/13/2014   Pneumococcal Polysaccharide-23 03/15/2013   Td 12/03/2004   Zoster Recombinant(Shingrix) 06/16/2018, 03/13/2021   Zoster, Live 02/05/2011    TDAP status: Due, Education has been provided regarding the importance of this vaccine. Advised may receive this vaccine at local pharmacy or Health Dept. Aware to provide a copy of the vaccination record if obtained from local pharmacy or Health Dept. Verbalized acceptance and understanding.  Flu Vaccine status: Up to  date  Pneumococcal vaccine status: Up to date  Covid-19 vaccine status: Completed vaccines  Qualifies for Shingles Vaccine? Yes   Zostavax completed Yes   Shingrix Completed?: Yes  Screening Tests Health Maintenance  Topic Date Due   DTaP/Tdap/Td (2 - Tdap) 12/04/2014   COVID-19 Vaccine (3 - 2023-24 season) 11/28/2021   INFLUENZA VACCINE  10/29/2022   MAMMOGRAM  02/11/2023   Medicare Annual Wellness (AWV)  09/28/2023   Pneumonia Vaccine 37+ Years old  Completed   DEXA SCAN  Completed   Hepatitis C Screening  Completed   Zoster Vaccines- Shingrix  Completed   HPV VACCINES  Aged Out   Colonoscopy  Discontinued    Health Maintenance  Health Maintenance Due  Topic Date Due   DTaP/Tdap/Td (2 - Tdap) 12/04/2014   COVID-19 Vaccine (3 - 2023-24 season) 11/28/2021    Colorectal cancer screening: No longer required. -wants to have another one b/c of hx (sister died of colon cancer)  Mammogram status: Completed 02/10/22. Repeat every year  Bone Density status: Completed 01/15/21. Results reflect: Bone density results: OSTEOPENIA. Repeat every 5 years.  Lung Cancer Screening: (Low Dose CT Chest recommended if Age 56-80 years, 20 pack-year currently smoking OR have quit w/in 15years.) does not qualify.     Additional Screening:  Hepatitis C Screening: does qualify; Completed 07/19/15  Vision Screening: Recommended annual ophthalmology exams for early detection of glaucoma and other disorders of the eye. Is the patient up to date with their annual eye exam?  Yes  Who is the provider or what is the name of the office in which the patient attends annual eye exams? Dr.Brasington If pt is not established with a provider, would they like to be referred to a provider to establish care? No .   Dental Screening: Recommended annual dental exams for proper oral hygiene  Community Resource Referral / Chronic Care Management: CRR required this visit?  No   CCM required this visit?   No     Plan:     I have personally reviewed and noted the following in the patient's chart:   Medical and social history Use of alcohol, tobacco or illicit drugs  Current medications and supplements including opioid prescriptions. Patient is not currently taking opioid prescriptions. Functional ability and status Nutritional status Physical activity Advanced directives List of other physicians Hospitalizations, surgeries, and ER visits in previous 12 months Vitals Screenings to include cognitive, depression, and falls Referrals and appointments  In addition, I have reviewed and discussed with patient certain preventive protocols, quality metrics, and best practice recommendations. A written personalized care plan for preventive services as well as general preventive health recommendations were provided to patient.     Hal Hope, LPN   03/30/9145   After Visit Summary: (MyChart) Due to this being a telephonic visit, the after visit summary with patients personalized plan was offered to patient via MyChart   Nurse Notes: none

## 2022-09-28 NOTE — Patient Instructions (Signed)
[name] , Thank you for taking time to come for your Medicare Wellness Visit. I appreciate your ongoing commitment to your health goals. Please review the following plan we discussed and let me know if I can assist you in the future.   These are the goals we discussed:  Goals      DIET - EAT MORE FRUITS AND VEGETABLES     Patient Stated     09/13/2020, not to gain weight     Patient Stated     Stay healthy        This is a list of the screening recommended for you and due dates:  Health Maintenance  Topic Date Due   DTaP/Tdap/Td vaccine (2 - Tdap) 12/04/2014   COVID-19 Vaccine (3 - 2023-24 season) 11/28/2021   Flu Shot  10/29/2022   Mammogram  02/11/2023   Medicare Annual Wellness Visit  09/28/2023   Pneumonia Vaccine  Completed   DEXA scan (bone density measurement)  Completed   Hepatitis C Screening  Completed   Zoster (Shingles) Vaccine  Completed   HPV Vaccine  Aged Out   Colon Cancer Screening  Discontinued    Advanced directives: no  Conditions/risks identified: none  Next appointment: Follow up in one year for your annual wellness visit 10/04/23 @ 8:15 in person   Preventive Care 65 Years and Older, Female Preventive care refers to lifestyle choices and visits with your health care provider that can promote health and wellness. What does preventive care include? A yearly physical exam. This is also called an annual well check. Dental exams once or twice a year. Routine eye exams. Ask your health care provider how often you should have your eyes checked. Personal lifestyle choices, including: Daily care of your teeth and gums. Regular physical activity. Eating a healthy diet. Avoiding tobacco and drug use. Limiting alcohol use. Practicing safe sex. Taking low-dose aspirin every day. Taking vitamin and mineral supplements as recommended by your health care provider. What happens during an annual well check? The services and screenings done by your health care  provider during your annual well check will depend on your age, overall health, lifestyle risk factors, and family history of disease. Counseling  Your health care provider may ask you questions about your: Alcohol use. Tobacco use. Drug use. Emotional well-being. Home and relationship well-being. Sexual activity. Eating habits. History of falls. Memory and ability to understand (cognition). Work and work Astronomer. Reproductive health. Screening  You may have the following tests or measurements: Height, weight, and BMI. Blood pressure. Lipid and cholesterol levels. These may be checked every 5 years, or more frequently if you are over 56 years old. Skin check. Lung cancer screening. You may have this screening every year starting at age 63 if you have a 30-pack-year history of smoking and currently smoke or have quit within the past 15 years. Fecal occult blood test (FOBT) of the stool. You may have this test every year starting at age 66. Flexible sigmoidoscopy or colonoscopy. You may have a sigmoidoscopy every 5 years or a colonoscopy every 10 years starting at age 95. Hepatitis C blood test. Hepatitis B blood test. Sexually transmitted disease (STD) testing. Diabetes screening. This is done by checking your blood sugar (glucose) after you have not eaten for a while (fasting). You may have this done every 1-3 years. Bone density scan. This is done to screen for osteoporosis. You may have this done starting at age 75. Mammogram. This may be done every  1-2 years. Talk to your health care provider about how often you should have regular mammograms. Talk with your health care provider about your test results, treatment options, and if necessary, the need for more tests. Vaccines  Your health care provider may recommend certain vaccines, such as: Influenza vaccine. This is recommended every year. Tetanus, diphtheria, and acellular pertussis (Tdap, Td) vaccine. You may need a Td  booster every 10 years. Zoster vaccine. You may need this after age 10. Pneumococcal 13-valent conjugate (PCV13) vaccine. One dose is recommended after age 50. Pneumococcal polysaccharide (PPSV23) vaccine. One dose is recommended after age 90. Talk to your health care provider about which screenings and vaccines you need and how often you need them. This information is not intended to replace advice given to you by your health care provider. Make sure you discuss any questions you have with your health care provider. Document Released: 04/12/2015 Document Revised: 12/04/2015 Document Reviewed: 01/15/2015 Elsevier Interactive Patient Education  2017 [place] Prevention in the Home Falls can cause injuries. They can happen to people of all ages. There are many things you can do to make your home safe and to help prevent falls. What can I do on the outside of my home? Regularly fix the edges of walkways and driveways and fix any cracks. Remove anything that might make you trip as you walk through a door, such as a raised step or threshold. Trim any bushes or trees on the path to your home. Use bright outdoor lighting. Clear any walking paths of anything that might make someone trip, such as rocks or tools. Regularly check to see if handrails are loose or broken. Make sure that both sides of any steps have handrails. Any raised decks and porches should have guardrails on the edges. Have any leaves, snow, or ice cleared regularly. Use sand or salt on walking paths during winter. Clean up any spills in your garage right away. This includes oil or grease spills. What can I do in the bathroom? Use night lights. Install grab bars by the toilet and in the tub and shower. Do not use towel bars as grab bars. Use non-skid mats or decals in the tub or shower. If you need to sit down in the shower, use a plastic, non-slip stool. Keep the floor dry. Clean up any water that spills on the floor  as soon as it happens. Remove soap buildup in the tub or shower regularly. Attach bath mats securely with double-sided non-slip rug tape. Do not have throw rugs and other things on the floor that can make you trip. What can I do in the bedroom? Use night lights. Make sure that you have a light by your bed that is easy to reach. Do not use any sheets or blankets that are too big for your bed. They should not hang down onto the floor. Have a firm chair that has side arms. You can use this for support while you get dressed. Do not have throw rugs and other things on the floor that can make you trip. What can I do in the kitchen? Clean up any spills right away. Avoid walking on wet floors. Keep items that you use a lot in easy-to-reach places. If you need to reach something above you, use a strong step stool that has a grab bar. Keep electrical cords out of the way. Do not use floor polish or wax that makes floors slippery. If you must use wax, use non-skid  floor wax. Do not have throw rugs and other things on the floor that can make you trip. What can I do with my stairs? Do not leave any items on the stairs. Make sure that there are handrails on both sides of the stairs and use them. Fix handrails that are broken or loose. Make sure that handrails are as long as the stairways. Check any carpeting to make sure that it is firmly attached to the stairs. Fix any carpet that is loose or worn. Avoid having throw rugs at the top or bottom of the stairs. If you do have throw rugs, attach them to the floor with carpet tape. Make sure that you have a light switch at the top of the stairs and the bottom of the stairs. If you do not have them, ask someone to add them for you. What else can I do to help prevent falls? Wear shoes that: Do not have high heels. Have rubber bottoms. Are comfortable and fit you well. Are closed at the toe. Do not wear sandals. If you use a stepladder: Make sure that it is  fully opened. Do not climb a closed stepladder. Make sure that both sides of the stepladder are locked into place. Ask someone to hold it for you, if possible. Clearly mark and make sure that you can see: Any grab bars or handrails. First and last steps. Where the edge of each step is. Use tools that help you move around (mobility aids) if they are needed. These include: Canes. Walkers. Scooters. Crutches. Turn on the lights when you go into a dark area. Replace any light bulbs as soon as they burn out. Set up your furniture so you have a clear path. Avoid moving your furniture around. If any of your floors are uneven, fix them. If there are any pets around you, be aware of where they are. Review your medicines with your doctor. Some medicines can make you feel dizzy. This can increase your chance of falling. Ask your doctor what other things that you can do to help prevent falls. This information is not intended to replace advice given to you by your health care provider. Make sure you discuss any questions you have with your health care provider. Document Released: 01/10/2009 Document Revised: 08/22/2015 Document Reviewed: 04/20/2014 Elsevier Interactive Patient Education  2017 [employer].

## 2022-10-19 DIAGNOSIS — Z809 Family history of malignant neoplasm, unspecified: Secondary | ICD-10-CM | POA: Diagnosis not present

## 2022-10-19 DIAGNOSIS — I10 Essential (primary) hypertension: Secondary | ICD-10-CM | POA: Diagnosis not present

## 2022-10-19 DIAGNOSIS — K5900 Constipation, unspecified: Secondary | ICD-10-CM | POA: Diagnosis not present

## 2022-10-19 DIAGNOSIS — Z87891 Personal history of nicotine dependence: Secondary | ICD-10-CM | POA: Diagnosis not present

## 2022-10-19 DIAGNOSIS — M8580 Other specified disorders of bone density and structure, unspecified site: Secondary | ICD-10-CM | POA: Diagnosis not present

## 2022-10-19 DIAGNOSIS — F325 Major depressive disorder, single episode, in full remission: Secondary | ICD-10-CM | POA: Diagnosis not present

## 2022-10-19 DIAGNOSIS — K219 Gastro-esophageal reflux disease without esophagitis: Secondary | ICD-10-CM | POA: Diagnosis not present

## 2022-10-19 DIAGNOSIS — Z8249 Family history of ischemic heart disease and other diseases of the circulatory system: Secondary | ICD-10-CM | POA: Diagnosis not present

## 2022-10-19 DIAGNOSIS — E785 Hyperlipidemia, unspecified: Secondary | ICD-10-CM | POA: Diagnosis not present

## 2023-01-12 ENCOUNTER — Encounter: Payer: Medicare PPO | Admitting: Nurse Practitioner

## 2023-01-16 ENCOUNTER — Other Ambulatory Visit: Payer: Self-pay | Admitting: Nurse Practitioner

## 2023-01-17 NOTE — Progress Notes (Deleted)
LMP 06/30/1991 (Approximate)    Subjective:    Patient ID: Connie Morrison, female    DOB: 25-Jul-1945, 77 y.o.   MRN: [id number]  HPI: Connie Morrison is a 77 y.o. female presenting on 01/18/2023 for comprehensive medical examination. Current medical complaints include:none  She currently lives with: Menopausal Symptoms: no  HYPERTENSION / HYPERLIPIDEMIA Satisfied with current treatment? no Duration of hypertension: years BP monitoring frequency: not checking BP range:  BP medication side effects: no Past BP meds: amlodipine and PRN HCTZ Duration of hyperlipidemia: years Cholesterol medication side effects: no Cholesterol supplements: none Past cholesterol medications: rosuvastatin (crestor) Medication compliance:  excellent Aspirin: no Recent stressors: no Recurrent headaches: no Visual changes: no Palpitations: no Dyspnea: no Chest pain: no Lower extremity edema: no Dizzy/lightheaded: no  ANXIETY/DEPRESSION Mood has been up and down.  Had a death in the family and since then it has been difficult.  Denies SI.  Feels like Prozac is working well for her.   Depression Screen done today and results listed below:     09/28/2022    8:12 AM 07/10/2022    8:22 AM 01/08/2022    8:28 AM 10/22/2021    9:55 AM 09/15/2021   12:29 PM  Depression screen PHQ 2/9  Decreased Interest 0 0 1 0 0  Down, Depressed, Hopeless 0 1 1 0 0  PHQ - 2 Score 0 1 2 0 0  Altered sleeping 0 1 0 0   Tired, decreased energy 0 0 0 0   Change in appetite 0 1 1 2    Feeling bad or failure about yourself  0 0 0 0   Trouble concentrating 0 0 0 0   Moving slowly or fidgety/restless 0 0 0 0   Suicidal thoughts 0 0 0 0   PHQ-9 Score 0 3 3 2    Difficult doing work/chores Not difficult at all  Not difficult at all Not difficult at all     The patient does not have a history of falls. I did complete a risk assessment for falls. A plan of care for falls was documented.   Past Medical History:  Past Medical  History:  Diagnosis Date  . Allergic rhinitis   . Anginal pain (HCC)   . Anxiety   . Complication of anesthesia    itching after anesthesia (50 yrs ago)  . Depression   . Dyspnea    (pt denies)  . GERD (gastroesophageal reflux disease)   . Hematuria   . Hyperlipidemia   . Hypertension    (pt denies)  . Menopause   . Microscopic hematuria   . Vaginal atrophy   . Wears dentures    lower partial    Surgical History:  Past Surgical History:  Procedure Laterality Date  . BREAST SURGERY     cyst removed  . CATARACT EXTRACTION W/PHACO Left 11/02/2018   Procedure: CATARACT EXTRACTION PHACO AND INTRAOCULAR LENS PLACEMENT (IOC)  LEFT;  Surgeon: Lockie Mola, MD;  Location: [facility];  Service: Ophthalmology;  Laterality: Left;  . CATARACT EXTRACTION W/PHACO Right 11/23/2018   Procedure: CATARACT EXTRACTION PHACO AND INTRAOCULAR LENS PLACEMENT (IOC) - right  00:50.6  17.1;  Surgeon: Lockie Mola, MD;  Location: [facility];  Service: Ophthalmology;  Laterality: Right;  . COLONOSCOPY    . COLONOSCOPY WITH PROPOFOL N/A 11/02/2017   Procedure: COLONOSCOPY WITH PROPOFOL;  Surgeon: Christena Deem, MD;  Location: [facility];  Service: Endoscopy;  Laterality: N/A;  . TONSILLECTOMY    .  TUMOR EXCISION Right    kidney    Medications:  Current Outpatient Medications on File Prior to Visit  Medication Sig  . amLODipine (NORVASC) 5 MG tablet Take 1 tablet (5 mg total) by mouth daily.  . diphenhydramine-acetaminophen (TYLENOL PM) 25-500 MG TABS tablet Take 0.5 tablets by mouth at bedtime as needed.  Marland Kitchen FLUoxetine (PROZAC) 20 MG capsule Take 1 capsule (20 mg total) by mouth daily.  . hydrochlorothiazide (HYDRODIURIL) 25 MG tablet Take 0.5 tablets (12.5 mg total) by mouth as needed.  . Multiple Vitamin (MULTIVITAMIN) tablet Take 1 tablet by mouth daily.  . rosuvastatin (CRESTOR) 20 MG tablet Take 1 tablet (20 mg total) by mouth daily.  Marland Kitchen tretinoin (RETIN-A)  0.1 % cream APPLY PEA SIZE AMOUNT TO ENTIRE DRY FACE NIGHTLY   No current facility-administered medications on file prior to visit.    Allergies:  Allergies  Allergen Reactions  . Cymbalta [Duloxetine Hcl] Other (See Comments)    constipation  . Other     Other Reaction(s): Not available  . Sulfa Antibiotics Rash    Social History:  Social History   Socioeconomic History  . Marital status: Married    Spouse name: Not on file  . Number of children: Not on file  . Years of education: Not on file  . Highest education level: Not on file  Occupational History  . Occupation: retired   Tobacco Use  . Smoking status: Former    Current packs/day: 0.00    Types: Cigarettes    Quit date: 1980    Years since quitting: 44.8  . Smokeless tobacco: Never  Vaping Use  . Vaping status: Never Used  Substance and Sexual Activity  . Alcohol use: No    Alcohol/week: 0.0 standard drinks of alcohol  . Drug use: No  . Sexual activity: Yes  Other Topics Concern  . Not on file  Social History Narrative  . Not on file   Social Determinants of Health   Financial Resource Strain: Low Risk  (09/28/2022)   Overall Financial Resource Strain (CARDIA)   . Difficulty of Paying Living Expenses: Not hard at all  Food Insecurity: No Food Insecurity (09/28/2022)   Hunger Vital Sign   . Worried About Programme researcher, broadcasting/film/video in the Last Year: Never true   . Ran Out of Food in the Last Year: Never true  Transportation Needs: No Transportation Needs (09/28/2022)   PRAPARE - Transportation   . Lack of Transportation (Medical): No   . Lack of Transportation (Non-Medical): No  Physical Activity: Sufficiently Active (09/28/2022)   Exercise Vital Sign   . Days of Exercise per Week: 4 days   . Minutes of Exercise per Session: 60 min  Stress: No Stress Concern Present (09/28/2022)   [employer] of Occupational Health - Occupational Stress Questionnaire   . Feeling of Stress : Not at all  Social Connections:  Moderately Integrated (09/28/2022)   Social Connection and Isolation Panel [NHANES]   . Frequency of Communication with Friends and Family: More than three times a week   . Frequency of Social Gatherings with Friends and Family: Once a week   . Attends Religious Services: More than 4 times per year   . Active Member of Clubs or Organizations: No   . Attends Banker Meetings: Never   . Marital Status: Married  Catering manager Violence: Not At Risk (09/28/2022)   Humiliation, Afraid, Rape, and Kick questionnaire   . Fear of Current or Ex-Partner:  No   . Emotionally Abused: No   . Physically Abused: No   . Sexually Abused: No   Social History   Tobacco Use  Smoking Status Former  . Current packs/day: 0.00  . Types: Cigarettes  . Quit date: 19  . Years since quitting: 44.8  Smokeless Tobacco Never   Social History   Substance and Sexual Activity  Alcohol Use No  . Alcohol/week: 0.0 standard drinks of alcohol    Family History:  Family History  Problem Relation Age of Onset  . Heart disease Father   . Cancer Sister        lung  . Cancer Sister        lung  . COPD Sister   . Stroke Maternal Grandmother   . Cancer Maternal Grandfather        lung  . Cancer Brother        colon  . Diabetes Brother   . Heart disease Brother   . Breast cancer Neg Hx     Past medical history, surgical history, medications, allergies, family history and social history reviewed with patient today and changes made to appropriate areas of the chart.   Review of Systems  Eyes:  Negative for blurred vision and double vision.  Respiratory:  Negative for shortness of breath.   Cardiovascular:  Negative for chest pain, palpitations and leg swelling.  Neurological:  Negative for dizziness and headaches.  Psychiatric/Behavioral:  Positive for depression. Negative for suicidal ideas. The patient is not nervous/anxious.    All other ROS negative except what is listed above and in the  HPI.      Objective:    LMP 06/30/1991 (Approximate)   Wt Readings from Last 3 Encounters:  09/28/22 143 lb 3.2 oz (65 kg)  07/10/22 145 lb 4.8 oz (65.9 kg)  01/08/22 140 lb 14.4 oz (63.9 kg)    Physical Exam Vitals and nursing note reviewed.  Constitutional:      General: She is awake. She is not in acute distress.    Appearance: She is well-developed. She is not ill-appearing.  HENT:     Head: Normocephalic and atraumatic.     Right Ear: Hearing, tympanic membrane, ear canal and external ear normal. No drainage.     Left Ear: Hearing, tympanic membrane, ear canal and external ear normal. No drainage.     Nose: Nose normal.     Right Sinus: No maxillary sinus tenderness or frontal sinus tenderness.     Left Sinus: No maxillary sinus tenderness or frontal sinus tenderness.     Mouth/Throat:     Mouth: Mucous membranes are moist.     Pharynx: Oropharynx is clear. Uvula midline. No pharyngeal swelling, oropharyngeal exudate or posterior oropharyngeal erythema.  Eyes:     General: Lids are normal.        Right eye: No discharge.        Left eye: No discharge.     Extraocular Movements: Extraocular movements intact.     Conjunctiva/sclera: Conjunctivae normal.     Pupils: Pupils are equal, round, and reactive to light.     Visual Fields: Right eye visual fields normal and left eye visual fields normal.  Neck:     Thyroid: No thyromegaly.     Vascular: No carotid bruit.     Trachea: Trachea normal.  Cardiovascular:     Rate and Rhythm: Normal rate and regular rhythm.     Heart sounds: Normal heart sounds. No murmur heard.  No gallop.  Pulmonary:     Effort: Pulmonary effort is normal. No accessory muscle usage or respiratory distress.     Breath sounds: Normal breath sounds.  Chest:  Breasts:    Right: Normal.     Left: Normal.  Abdominal:     General: Bowel sounds are normal.     Palpations: Abdomen is soft. There is no hepatomegaly or splenomegaly.     Tenderness:  There is no abdominal tenderness.  Musculoskeletal:        General: Normal range of motion.     Cervical back: Normal range of motion and neck supple.     Right lower leg: No edema.     Left lower leg: No edema.  Lymphadenopathy:     Head:     Right side of head: No submental, submandibular, tonsillar, preauricular or posterior auricular adenopathy.     Left side of head: No submental, submandibular, tonsillar, preauricular or posterior auricular adenopathy.     Cervical: No cervical adenopathy.     Upper Body:     Right upper body: No supraclavicular, axillary or pectoral adenopathy.     Left upper body: No supraclavicular, axillary or pectoral adenopathy.  Skin:    General: Skin is warm and dry.     Capillary Refill: Capillary refill takes less than 2 seconds.     Findings: No rash.  Neurological:     Mental Status: She is alert and oriented to person, place, and time.     Gait: Gait is intact.     Deep Tendon Reflexes: Reflexes are normal and symmetric.     Reflex Scores:      Brachioradialis reflexes are 2+ on the right side and 2+ on the left side.      Patellar reflexes are 2+ on the right side and 2+ on the left side. Psychiatric:        Attention and Perception: Attention normal.        Mood and Affect: Mood normal.        Speech: Speech normal.        Behavior: Behavior normal. Behavior is cooperative.        Thought Content: Thought content normal.        Judgment: Judgment normal.    Results for orders placed or performed in visit on 07/10/22  Comp Met (CMET)  Result Value Ref Range   Glucose 99 70 - 99 mg/dL   BUN 14 8 - 27 mg/dL   Creatinine, Ser 4.09 0.57 - 1.00 mg/dL   eGFR 67 >81 XB/JYN/8.29   BUN/Creatinine Ratio 16 12 - 28   Sodium 146 (H) 134 - 144 mmol/L   Potassium 4.1 3.5 - 5.2 mmol/L   Chloride 105 96 - 106 mmol/L   CO2 26 20 - 29 mmol/L   Calcium 9.4 8.7 - 10.3 mg/dL   Total Protein 6.5 6.0 - 8.5 g/dL   Albumin 4.2 3.8 - 4.8 g/dL   Globulin,  Total 2.3 1.5 - 4.5 g/dL   Albumin/Globulin Ratio 1.8 1.2 - 2.2   Bilirubin Total 0.3 0.0 - 1.2 mg/dL   Alkaline Phosphatase 55 44 - 121 IU/L   AST 28 0 - 40 IU/L   ALT 15 0 - 32 IU/L  Lipid Profile  Result Value Ref Range   Cholesterol, Total 145 100 - 199 mg/dL   Triglycerides 83 0 - 149 mg/dL   HDL 73 >56 mg/dL   VLDL Cholesterol Cal 16 5 - 40  mg/dL   LDL Chol Calc (NIH) 56 0 - 99 mg/dL   Chol/HDL Ratio 2.0 0.0 - 4.4 ratio      Assessment & Plan:   Problem List Items Addressed This Visit       Cardiovascular and Mediastinum   Hypertension     Musculoskeletal and Integument   Osteopenia     Other   Hyperlipidemia   Depression, major, single episode, complete remission (HCC)   Other Visit Diagnoses     Annual physical exam    -  Primary        Follow up plan: No follow-ups on file.   LABORATORY TESTING:  - Pap smear: not applicable  IMMUNIZATIONS:   - Tdap: Tetanus vaccination status reviewed: last tetanus booster within 10 years. - Influenza: Administered today - Pneumovax: Up to date - Prevnar: Up to date - HPV: Not applicable - Zostavax vaccine:  Discussed at visit today  SCREENING: -Mammogram: Ordered today  - Colonoscopy: Up to date  - Bone Density: Ordered today  -Hearing Test: Not applicable  -Spirometry: Not applicable   PATIENT COUNSELING:   Advised to take 1 mg of folate supplement per day if capable of pregnancy.   Sexuality: Discussed sexually transmitted diseases, partner selection, use of condoms, avoidance of unintended pregnancy  and contraceptive alternatives.   Advised to avoid cigarette smoking.  I discussed with the patient that most people either abstain from alcohol or drink within safe limits (<=14/week and <=4 drinks/occasion for males, <=7/weeks and <= 3 drinks/occasion for females) and that the risk for alcohol disorders and other health effects rises proportionally with the number of drinks per week and how often a drinker  exceeds daily limits.  Discussed cessation/primary prevention of drug use and availability of treatment for abuse.   Diet: Encouraged to adjust caloric intake to maintain  or achieve ideal body weight, to reduce intake of dietary saturated fat and total fat, to limit sodium intake by avoiding high sodium foods and not adding table salt, and to maintain adequate dietary potassium and calcium preferably from fresh fruits, vegetables, and low-fat dairy products.    stressed the importance of regular exercise  Injury prevention: Discussed safety belts, safety helmets, smoke detector, smoking near bedding or upholstery.   Dental health: Discussed importance of regular tooth brushing, flossing, and dental visits.    NEXT PREVENTATIVE PHYSICAL DUE IN 1 YEAR. No follow-ups on file.

## 2023-01-18 ENCOUNTER — Encounter: Payer: Medicare PPO | Admitting: Nurse Practitioner

## 2023-01-18 DIAGNOSIS — E7800 Pure hypercholesterolemia, unspecified: Secondary | ICD-10-CM

## 2023-01-18 DIAGNOSIS — Z0000 Encounter for general adult medical examination without abnormal findings: Secondary | ICD-10-CM

## 2023-01-18 DIAGNOSIS — M8580 Other specified disorders of bone density and structure, unspecified site: Secondary | ICD-10-CM

## 2023-01-18 DIAGNOSIS — I10 Essential (primary) hypertension: Secondary | ICD-10-CM

## 2023-01-18 DIAGNOSIS — F325 Major depressive disorder, single episode, in full remission: Secondary | ICD-10-CM

## 2023-01-18 NOTE — Telephone Encounter (Signed)
Requested Prescriptions  Pending Prescriptions Disp Refills   amLODipine (NORVASC) 5 MG tablet [Pharmacy Med Name: AMLODIPINE BESYLATE 5 MG TAB] 90 tablet 0    Sig: TAKE 1 TABLET (5 MG TOTAL) BY MOUTH DAILY.     Cardiovascular: Calcium Channel Blockers 2 Passed - 01/16/2023  1:12 AM      Passed - Last BP in normal range    BP Readings from Last 1 Encounters:  09/28/22 110/60         Passed - Last Heart Rate in normal range    Pulse Readings from Last 1 Encounters:  07/10/22 (!) 59         Passed - Valid encounter within last 6 months    Recent Outpatient Visits           6 months ago Primary hypertension   [place] [facility] Larae Grooms, NP   1 year ago Annual physical exam   [place] [facility] Larae Grooms, NP   1 year ago RUQ abdominal pain   [place] [facility] Larae Grooms, NP   1 year ago Poison ivy   [facility] Larae Grooms, NP   1 year ago Primary hypertension   [place] [facility] Larae Grooms, NP       Future Appointments             In 1 month Larae Grooms, NP [place] [facility]             FLUoxetine (PROZAC) 20 MG capsule [Pharmacy Med Name: FLUOXETINE HCL 20 MG CAPSULE] 90 capsule 0    Sig: TAKE 1 CAPSULE BY MOUTH EVERY DAY     Psychiatry:  Antidepressants - SSRI Passed - 01/16/2023  1:12 AM      Passed - Completed PHQ-2 or PHQ-9 in the last 360 days      Passed - Valid encounter within last 6 months    Recent Outpatient Visits           6 months ago Primary hypertension   [facility] Larae Grooms, NP   1 year ago Annual physical exam   [place] [facility] Larae Grooms, NP   1 year ago RUQ abdominal pain   [place] [facility] Larae Grooms, NP   1 year ago Poison ivy   [facility] Larae Grooms,  NP   1 year ago Primary hypertension    [facility] Larae Grooms, NP       Future Appointments             In 1 month Larae Grooms, NP  [facility]

## 2023-01-21 ENCOUNTER — Encounter: Payer: Medicare PPO | Admitting: Nurse Practitioner

## 2023-02-05 DIAGNOSIS — L538 Other specified erythematous conditions: Secondary | ICD-10-CM | POA: Diagnosis not present

## 2023-02-05 DIAGNOSIS — L820 Inflamed seborrheic keratosis: Secondary | ICD-10-CM | POA: Diagnosis not present

## 2023-02-23 DIAGNOSIS — M1612 Unilateral primary osteoarthritis, left hip: Secondary | ICD-10-CM | POA: Diagnosis not present

## 2023-02-24 ENCOUNTER — Encounter: Payer: Medicare PPO | Admitting: Nurse Practitioner

## 2023-03-02 ENCOUNTER — Other Ambulatory Visit: Payer: Self-pay | Admitting: Nurse Practitioner

## 2023-03-02 DIAGNOSIS — Z1231 Encounter for screening mammogram for malignant neoplasm of breast: Secondary | ICD-10-CM

## 2023-03-11 ENCOUNTER — Ambulatory Visit: Payer: Medicare PPO | Admitting: Nurse Practitioner

## 2023-03-11 ENCOUNTER — Encounter: Payer: Self-pay | Admitting: Nurse Practitioner

## 2023-03-11 VITALS — BP 126/64 | HR 56 | Ht 67.0 in | Wt 146.6 lb

## 2023-03-11 DIAGNOSIS — Z23 Encounter for immunization: Secondary | ICD-10-CM

## 2023-03-11 DIAGNOSIS — E7800 Pure hypercholesterolemia, unspecified: Secondary | ICD-10-CM

## 2023-03-11 DIAGNOSIS — F325 Major depressive disorder, single episode, in full remission: Secondary | ICD-10-CM

## 2023-03-11 DIAGNOSIS — Z0000 Encounter for general adult medical examination without abnormal findings: Secondary | ICD-10-CM

## 2023-03-11 DIAGNOSIS — I10 Essential (primary) hypertension: Secondary | ICD-10-CM

## 2023-03-11 LAB — MICROSCOPIC EXAMINATION
Bacteria, UA: NONE SEEN
WBC, UA: NONE SEEN /[HPF] (ref 0–5)

## 2023-03-11 LAB — URINALYSIS, ROUTINE W REFLEX MICROSCOPIC
Bilirubin, UA: NEGATIVE
Glucose, UA: NEGATIVE
Ketones, UA: NEGATIVE
Leukocytes,UA: NEGATIVE
Nitrite, UA: NEGATIVE
Protein,UA: NEGATIVE
Specific Gravity, UA: 1.01 (ref 1.005–1.030)
Urobilinogen, Ur: 0.2 mg/dL (ref 0.2–1.0)
pH, UA: 6 (ref 5.0–7.5)

## 2023-03-11 MED ORDER — AMLODIPINE BESYLATE 5 MG PO TABS: 5.0000 mg | ORAL_TABLET | Freq: Every day | ORAL | 0 refills | Status: DC

## 2023-03-11 MED ORDER — FLUOXETINE HCL 20 MG PO CAPS: 20.0000 mg | ORAL_CAPSULE | Freq: Every day | ORAL | 1 refills | Status: DC

## 2023-03-11 MED ORDER — ROSUVASTATIN CALCIUM 20 MG PO TABS: 20.0000 mg | ORAL_TABLET | Freq: Every day | ORAL | 1 refills | Status: DC

## 2023-03-11 NOTE — Progress Notes (Signed)
BP 126/64 (BP Location: Left Arm, Patient Position: Sitting, Cuff Size: Large)   Pulse (!) 56   Ht 5\' 7"  (1.702 m)   Wt 146 lb 9.6 oz (66.5 kg)   LMP 06/30/1991 (Approximate)   SpO2 98%   BMI 22.96 kg/m    Subjective:    Patient ID: Connie Morrison, female    DOB: 21-Feb-1946, 77 y.o.   MRN: [id number]  HPI: Connie Morrison is a 77 y.o. female presenting on 03/11/2023 for comprehensive medical examination. Current medical complaints include:none  She currently lives with: Menopausal Symptoms: no  HYPERTENSION / HYPERLIPIDEMIA Satisfied with current treatment? no Duration of hypertension: years BP monitoring frequency: not checking BP range:  BP medication side effects: no Past BP meds: amlodipine and PRN HCTZ Duration of hyperlipidemia: years Cholesterol medication side effects: no Cholesterol supplements: none Past cholesterol medications: rosuvastatin (crestor) Medication compliance:  excellent Aspirin: no Recent stressors: no Recurrent headaches: no Visual changes: no Palpitations: no Dyspnea: no Chest pain: no Lower extremity edema: no Dizzy/lightheaded: no  ANXIETY/DEPRESSION Patient states her mood is good and some days it is sad after losing her grandson.  She does have a great grand daughter that helps a lot.  No thoughts of harming herself but does get sad.  Denies SI.   Depression Screen done today and results listed below:     03/11/2023    2:31 PM 09/28/2022    8:12 AM 07/10/2022    8:22 AM 01/08/2022    8:28 AM 10/22/2021    9:55 AM  Depression screen PHQ 2/9  Decreased Interest 0 0 0 1 0  Down, Depressed, Hopeless 1 0 1 1 0  PHQ - 2 Score 1 0 1 2 0  Altered sleeping 1 0 1 0 0  Tired, decreased energy 1 0 0 0 0  Change in appetite 1 0 1 1 2   Feeling bad or failure about yourself  0 0 0 0 0  Trouble concentrating 0 0 0 0 0  Moving slowly or fidgety/restless 0 0 0 0 0  Suicidal thoughts 0 0 0 0 0  PHQ-9 Score 4 0 3 3 2   Difficult doing work/chores   Not difficult at all  Not difficult at all Not difficult at all    The patient does not have a history of falls. I did complete a risk assessment for falls. A plan of care for falls was documented.   Past Medical History:  Past Medical History:  Diagnosis Date   Allergic rhinitis    Anginal pain (HCC)    Anxiety    Complication of anesthesia    itching after anesthesia (50 yrs ago)   Depression    Dyspnea    (pt denies)   GERD (gastroesophageal reflux disease)    Hematuria    Hyperlipidemia    Hypertension    (pt denies)   Menopause    Microscopic hematuria    Vaginal atrophy    Wears dentures    lower partial    Surgical History:  Past Surgical History:  Procedure Laterality Date   BREAST SURGERY     cyst removed   CATARACT EXTRACTION W/PHACO Left 11/02/2018   Procedure: CATARACT EXTRACTION PHACO AND INTRAOCULAR LENS PLACEMENT (IOC)  LEFT;  Surgeon: Lockie Mola, MD;  Location: [facility];  Service: Ophthalmology;  Laterality: Left;   CATARACT EXTRACTION W/PHACO Right 11/23/2018   Procedure: CATARACT EXTRACTION PHACO AND INTRAOCULAR LENS PLACEMENT (IOC) - right  00:50.6  17.1;  Surgeon: Lockie Mola, MD;  Location: [facility];  Service: Ophthalmology;  Laterality: Right;   COLONOSCOPY     COLONOSCOPY WITH PROPOFOL N/A 11/02/2017   Procedure: COLONOSCOPY WITH PROPOFOL;  Surgeon: Christena Deem, MD;  Location: [facility];  Service: Endoscopy;  Laterality: N/A;   TONSILLECTOMY     TUMOR EXCISION Right    kidney    Medications:  Current Outpatient Medications on File Prior to Visit  Medication Sig   diphenhydramine-acetaminophen (TYLENOL PM) 25-500 MG TABS tablet Take 0.5 tablets by mouth at bedtime as needed.   hydrochlorothiazide (HYDRODIURIL) 25 MG tablet Take 0.5 tablets (12.5 mg total) by mouth as needed.   Multiple Vitamin (MULTIVITAMIN) tablet Take 1 tablet by mouth daily.   tretinoin (RETIN-A) 0.1 % cream APPLY PEA SIZE  AMOUNT TO ENTIRE DRY FACE NIGHTLY   No current facility-administered medications on file prior to visit.    Allergies:  Allergies  Allergen Reactions   Cymbalta [Duloxetine Hcl] Other (See Comments)    constipation   Other     Other Reaction(s): Not available   Sulfa Antibiotics Rash    Social History:  Social History   Socioeconomic History   Marital status: Married    Spouse name: Not on file   Number of children: Not on file   Years of education: Not on file   Highest education level: Not on file  Occupational History   Occupation: retired   Tobacco Use   Smoking status: Former    Current packs/day: 0.00    Types: Cigarettes    Quit date: 1980    Years since quitting: 44.9   Smokeless tobacco: Never  Vaping Use   Vaping status: Never Used  Substance and Sexual Activity   Alcohol use: No    Alcohol/week: 0.0 standard drinks of alcohol   Drug use: No   Sexual activity: Yes  Other Topics Concern   Not on file  Social History Narrative   Not on file   Social Drivers of Health   Financial Resource Strain: Low Risk  (09/28/2022)   Overall Financial Resource Strain (CARDIA)    Difficulty of Paying Living Expenses: Not hard at all  Food Insecurity: No Food Insecurity (09/28/2022)   Hunger Vital Sign    Worried About Running Out of Food in the Last Year: Never true    Ran Out of Food in the Last Year: Never true  Transportation Needs: No Transportation Needs (09/28/2022)   PRAPARE - Administrator, Civil Service (Medical): No    Lack of Transportation (Non-Medical): No  Physical Activity: Sufficiently Active (09/28/2022)   Exercise Vital Sign    Days of Exercise per Week: 4 days    Minutes of Exercise per Session: 60 min  Stress: No Stress Concern Present (09/28/2022)   [employer] of Occupational Health - Occupational Stress Questionnaire    Feeling of Stress : Not at all  Social Connections: Moderately Integrated (09/28/2022)   Social Connection and  Isolation Panel [NHANES]    Frequency of Communication with Friends and Family: More than three times a week    Frequency of Social Gatherings with Friends and Family: Once a week    Attends Religious Services: More than 4 times per year    Active Member of [employer] or Organizations: No    Attends Banker Meetings: Never    Marital Status: Married  Catering manager Violence: Not At Risk (09/28/2022)   Humiliation, Afraid, Rape, and Kick questionnaire  Fear of Current or Ex-Partner: No    Emotionally Abused: No    Physically Abused: No    Sexually Abused: No   Social History   Tobacco Use  Smoking Status Former   Current packs/day: 0.00   Types: Cigarettes   Quit date: 1980   Years since quitting: 44.9  Smokeless Tobacco Never   Social History   Substance and Sexual Activity  Alcohol Use No   Alcohol/week: 0.0 standard drinks of alcohol    Family History:  Family History  Problem Relation Age of Onset   Heart disease Father    Cancer Sister        lung   Cancer Sister        lung   COPD Sister    Stroke Maternal Grandmother    Cancer Maternal Grandfather        lung   Cancer Brother        colon   Diabetes Brother    Heart disease Brother    Breast cancer Neg Hx     Past medical history, surgical history, medications, allergies, family history and social history reviewed with patient today and changes made to appropriate areas of the chart.   Review of Systems  Eyes:  Negative for blurred vision and double vision.  Respiratory:  Negative for shortness of breath.   Cardiovascular:  Negative for chest pain, palpitations and leg swelling.  Neurological:  Negative for dizziness and headaches.  Psychiatric/Behavioral:  Positive for depression. Negative for suicidal ideas. The patient is not nervous/anxious.    All other ROS negative except what is listed above and in the HPI.      Objective:    BP 126/64 (BP Location: Left Arm, Patient Position:  Sitting, Cuff Size: Large)   Pulse (!) 56   Ht 5\' 7"  (1.702 m)   Wt 146 lb 9.6 oz (66.5 kg)   LMP 06/30/1991 (Approximate)   SpO2 98%   BMI 22.96 kg/m   Wt Readings from Last 3 Encounters:  03/11/23 146 lb 9.6 oz (66.5 kg)  09/28/22 143 lb 3.2 oz (65 kg)  07/10/22 145 lb 4.8 oz (65.9 kg)    Physical Exam Vitals and nursing note reviewed.  Constitutional:      General: She is awake. She is not in acute distress.    Appearance: Normal appearance. She is well-developed. She is not ill-appearing.  HENT:     Head: Normocephalic and atraumatic.     Right Ear: Hearing, tympanic membrane, ear canal and external ear normal. No drainage.     Left Ear: Hearing, tympanic membrane, ear canal and external ear normal. No drainage.     Nose: Nose normal.     Right Sinus: No maxillary sinus tenderness or frontal sinus tenderness.     Left Sinus: No maxillary sinus tenderness or frontal sinus tenderness.     Mouth/Throat:     Mouth: Mucous membranes are moist.     Pharynx: Oropharynx is clear. Uvula midline. No pharyngeal swelling, oropharyngeal exudate or posterior oropharyngeal erythema.  Eyes:     General: Lids are normal.        Right eye: No discharge.        Left eye: No discharge.     Extraocular Movements: Extraocular movements intact.     Conjunctiva/sclera: Conjunctivae normal.     Pupils: Pupils are equal, round, and reactive to light.     Visual Fields: Right eye visual fields normal and left eye visual fields  normal.  Neck:     Thyroid: No thyromegaly.     Vascular: No carotid bruit.     Trachea: Trachea normal.  Cardiovascular:     Rate and Rhythm: Normal rate and regular rhythm.     Heart sounds: Normal heart sounds. No murmur heard.    No gallop.  Pulmonary:     Effort: Pulmonary effort is normal. No accessory muscle usage or respiratory distress.     Breath sounds: Normal breath sounds.  Chest:  Breasts:    Right: Normal.     Left: Normal.  Abdominal:     General:  Bowel sounds are normal.     Palpations: Abdomen is soft. There is no hepatomegaly or splenomegaly.     Tenderness: There is no abdominal tenderness.  Musculoskeletal:        General: Normal range of motion.     Cervical back: Normal range of motion and neck supple.     Right lower leg: No edema.     Left lower leg: No edema.  Lymphadenopathy:     Head:     Right side of head: No submental, submandibular, tonsillar, preauricular or posterior auricular adenopathy.     Left side of head: No submental, submandibular, tonsillar, preauricular or posterior auricular adenopathy.     Cervical: No cervical adenopathy.     Upper Body:     Right upper body: No supraclavicular, axillary or pectoral adenopathy.     Left upper body: No supraclavicular, axillary or pectoral adenopathy.  Skin:    General: Skin is warm and dry.     Capillary Refill: Capillary refill takes less than 2 seconds.     Findings: No rash.  Neurological:     Mental Status: She is alert and oriented to person, place, and time.     Gait: Gait is intact.     Deep Tendon Reflexes: Reflexes are normal and symmetric.     Reflex Scores:      Brachioradialis reflexes are 2+ on the right side and 2+ on the left side.      Patellar reflexes are 2+ on the right side and 2+ on the left side. Psychiatric:        Attention and Perception: Attention normal.        Mood and Affect: Mood normal.        Speech: Speech normal.        Behavior: Behavior normal. Behavior is cooperative.        Thought Content: Thought content normal.        Judgment: Judgment normal.     Results for orders placed or performed in visit on 07/10/22  Comp Met (CMET)   Collection Time: 07/10/22  8:44 AM  Result Value Ref Range   Glucose 99 70 - 99 mg/dL   BUN 14 8 - 27 mg/dL   Creatinine, Ser 1.61 0.57 - 1.00 mg/dL   eGFR 67 >09 UE/AVW/0.98   BUN/Creatinine Ratio 16 12 - 28   Sodium 146 (H) 134 - 144 mmol/L   Potassium 4.1 3.5 - 5.2 mmol/L   Chloride  105 96 - 106 mmol/L   CO2 26 20 - 29 mmol/L   Calcium 9.4 8.7 - 10.3 mg/dL   Total Protein 6.5 6.0 - 8.5 g/dL   Albumin 4.2 3.8 - 4.8 g/dL   Globulin, Total 2.3 1.5 - 4.5 g/dL   Albumin/Globulin Ratio 1.8 1.2 - 2.2   Bilirubin Total 0.3 0.0 - 1.2 mg/dL  Alkaline Phosphatase 55 44 - 121 IU/L   AST 28 0 - 40 IU/L   ALT 15 0 - 32 IU/L  Lipid Profile   Collection Time: 07/10/22  8:44 AM  Result Value Ref Range   Cholesterol, Total 145 100 - 199 mg/dL   Triglycerides 83 0 - 149 mg/dL   HDL 73 >75 mg/dL   VLDL Cholesterol Cal 16 5 - 40 mg/dL   LDL Chol Calc (NIH) 56 0 - 99 mg/dL   Chol/HDL Ratio 2.0 0.0 - 4.4 ratio      Assessment & Plan:   Problem List Items Addressed This Visit       Cardiovascular and Mediastinum   Hypertension   Chronic.  Controlled.  Continue with current medication regimen on Amlodipine 5mg  daily and HCTZ PRN. Rarely uses the HCTZ. Refills sent today of Amlodipine.  Recommend checking blood pressure at home.  Bring log to next visit.  Labs ordered today.  Return to clinic in 6 months for reevaluation.  Call sooner if concerns arise.       Relevant Medications   amLODipine (NORVASC) 5 MG tablet   rosuvastatin (CRESTOR) 20 MG tablet     Other   Hyperlipidemia   Chronic.  Controlled.  Continue with current medication regimen of Crestor 20mg  daily.  Refills sent today.  Labs ordered today.  Return to clinic in 6 months for reevaluation.  Call sooner if concerns arise.       Relevant Medications   amLODipine (NORVASC) 5 MG tablet   rosuvastatin (CRESTOR) 20 MG tablet   Other Relevant Orders   Lipid panel   Depression, major, single episode, complete remission (HCC)   Chronic.  Controlled.  Continue with current medication regimen of Fluoxetine daily.  Refills sent today.  Labs ordered today.  Return to clinic in 6 months for reevaluation.  Call sooner if concerns arise.       Relevant Medications   FLUoxetine (PROZAC) 20 MG capsule   Other Visit  Diagnoses       Annual physical exam    -  Primary   Health maintenance reviewed during visit today.  Labs ordered.  Mammogram scheduled for this month.  Colonoscopy up to date. Vaccines reviewed today.   Relevant Orders   CBC with Differential/Platelet   Comprehensive metabolic panel   Lipid panel   TSH   Urinalysis, Routine w reflex microscopic     Flu vaccine need       Relevant Orders   Flu Vaccine Trivalent High Dose (Fluad)        Follow up plan: No follow-ups on file.   LABORATORY TESTING:  - Pap smear: not applicable  IMMUNIZATIONS:   - Tdap: Tetanus vaccination status reviewed: last tetanus booster within 10 years. - Influenza: Administered today - Pneumovax: Up to date - Prevnar: Up to date - HPV: Not applicable - Zostavax vaccine:  Discussed at visit today  SCREENING: -Mammogram: scheduled this morning - Colonoscopy: Up to date  - Bone Density: Ordered today  -Hearing Test: Not applicable  -Spirometry: Not applicable   PATIENT COUNSELING:   Advised to take 1 mg of folate supplement per day if capable of pregnancy.   Sexuality: Discussed sexually transmitted diseases, partner selection, use of condoms, avoidance of unintended pregnancy  and contraceptive alternatives.   Advised to avoid cigarette smoking.  I discussed with the patient that most people either abstain from alcohol or drink within safe limits (<=14/week and <=4 drinks/occasion for males, <=7/weeks  and <= 3 drinks/occasion for females) and that the risk for alcohol disorders and other health effects rises proportionally with the number of drinks per week and how often a drinker exceeds daily limits.  Discussed cessation/primary prevention of drug use and availability of treatment for abuse.   Diet: Encouraged to adjust caloric intake to maintain  or achieve ideal body weight, to reduce intake of dietary saturated fat and total fat, to limit sodium intake by avoiding high sodium foods and not  adding table salt, and to maintain adequate dietary potassium and calcium preferably from fresh fruits, vegetables, and low-fat dairy products.    stressed the importance of regular exercise  Injury prevention: Discussed safety belts, safety helmets, smoke detector, smoking near bedding or upholstery.   Dental health: Discussed importance of regular tooth brushing, flossing, and dental visits.    NEXT PREVENTATIVE PHYSICAL DUE IN 1 YEAR. No follow-ups on file.

## 2023-03-11 NOTE — Assessment & Plan Note (Signed)
Chronic.  Controlled.  Continue with current medication regimen on Amlodipine 5mg  daily and HCTZ PRN. Rarely uses the HCTZ. Refills sent today of Amlodipine.  Recommend checking blood pressure at home.  Bring log to next visit.  Labs ordered today.  Return to clinic in 6 months for reevaluation.  Call sooner if concerns arise.

## 2023-03-11 NOTE — Assessment & Plan Note (Signed)
Chronic.  Controlled.  Continue with current medication regimen of Fluoxetine daily.  Refills sent today.  Labs ordered today.  Return to clinic in 6 months for reevaluation.  Call sooner if concerns arise.

## 2023-03-11 NOTE — Assessment & Plan Note (Signed)
Chronic.  Controlled.  Continue with current medication regimen of Crestor 20mg  daily.  Refills sent today.  Labs ordered today.  Return to clinic in 6 months for reevaluation.  Call sooner if concerns arise.

## 2023-03-12 LAB — LIPID PANEL
Chol/HDL Ratio: 2.2 {ratio} (ref 0.0–4.4)
Cholesterol, Total: 154 mg/dL (ref 100–199)
HDL: 70 mg/dL (ref >39)
LDL Chol Calc (NIH): 65 mg/dL (ref 0–99)
Triglycerides: 106 mg/dL (ref 0–149)
VLDL Cholesterol Cal: 19 mg/dL (ref 5–40)

## 2023-03-12 LAB — COMPREHENSIVE METABOLIC PANEL
ALT: 13 [IU]/L (ref 0–32)
AST: 25 [IU]/L (ref 0–40)
Albumin: 4.1 g/dL (ref 3.8–4.8)
Alkaline Phosphatase: 62 [IU]/L (ref 44–121)
BUN/Creatinine Ratio: 20 (ref 12–28)
BUN: 21 mg/dL (ref 8–27)
Bilirubin Total: 0.3 mg/dL (ref 0.0–1.2)
CO2: 26 mmol/L (ref 20–29)
Calcium: 8.8 mg/dL (ref 8.7–10.3)
Chloride: 103 mmol/L (ref 96–106)
Creatinine, Ser: 1.05 mg/dL — ABNORMAL HIGH (ref 0.57–1.00)
Globulin, Total: 2.2 g/dL (ref 1.5–4.5)
Glucose: 90 mg/dL (ref 70–99)
Potassium: 4.1 mmol/L (ref 3.5–5.2)
Sodium: 140 mmol/L (ref 134–144)
Total Protein: 6.3 g/dL (ref 6.0–8.5)
eGFR: 55 mL/min/{1.73_m2} — ABNORMAL LOW (ref >59)

## 2023-03-12 LAB — CBC WITH DIFFERENTIAL/PLATELET
Basophils Absolute: 0 10*3/uL (ref 0.0–0.2)
Basos: 0 %
EOS (ABSOLUTE): 0.1 10*3/uL (ref 0.0–0.4)
Eos: 3 %
Hematocrit: 37.4 % (ref 34.0–46.6)
Hemoglobin: 12.1 g/dL (ref 11.1–15.9)
Immature Grans (Abs): 0 10*3/uL (ref 0.0–0.1)
Immature Granulocytes: 0 %
Lymphocytes Absolute: 1.6 10*3/uL (ref 0.7–3.1)
Lymphs: 34 %
MCH: 29.6 pg (ref 26.6–33.0)
MCHC: 32.4 g/dL (ref 31.5–35.7)
MCV: 91 fL (ref 79–97)
Monocytes Absolute: 0.7 10*3/uL (ref 0.1–0.9)
Monocytes: 14 %
Neutrophils Absolute: 2.4 10*3/uL (ref 1.4–7.0)
Neutrophils: 49 %
Platelets: 184 10*3/uL (ref 150–450)
RBC: 4.09 x10E6/uL (ref 3.77–5.28)
RDW: 12.5 % (ref 11.7–15.4)
WBC: 4.8 10*3/uL (ref 3.4–10.8)

## 2023-03-12 LAB — TSH: TSH: 1.21 u[IU]/mL (ref 0.450–4.500)

## 2023-03-18 IMAGING — MG MM DIGITAL SCREENING BILAT W/ TOMO AND CAD
6 of 10 series · 6 of 30 positions shown · non-contrast
Comparison: Previous exam(s).

CLINICAL DATA: Screening.

EXAM:
DIGITAL SCREENING BILATERAL MAMMOGRAM WITH TOMOSYNTHESIS AND CAD
TECHNIQUE: Bilateral screening digital craniocaudal and mediolateral oblique
mammograms were obtained. Bilateral screening digital breast
tomosynthesis was performed. The images were evaluated with
computer-aided detection.

[R CC synth-2D]
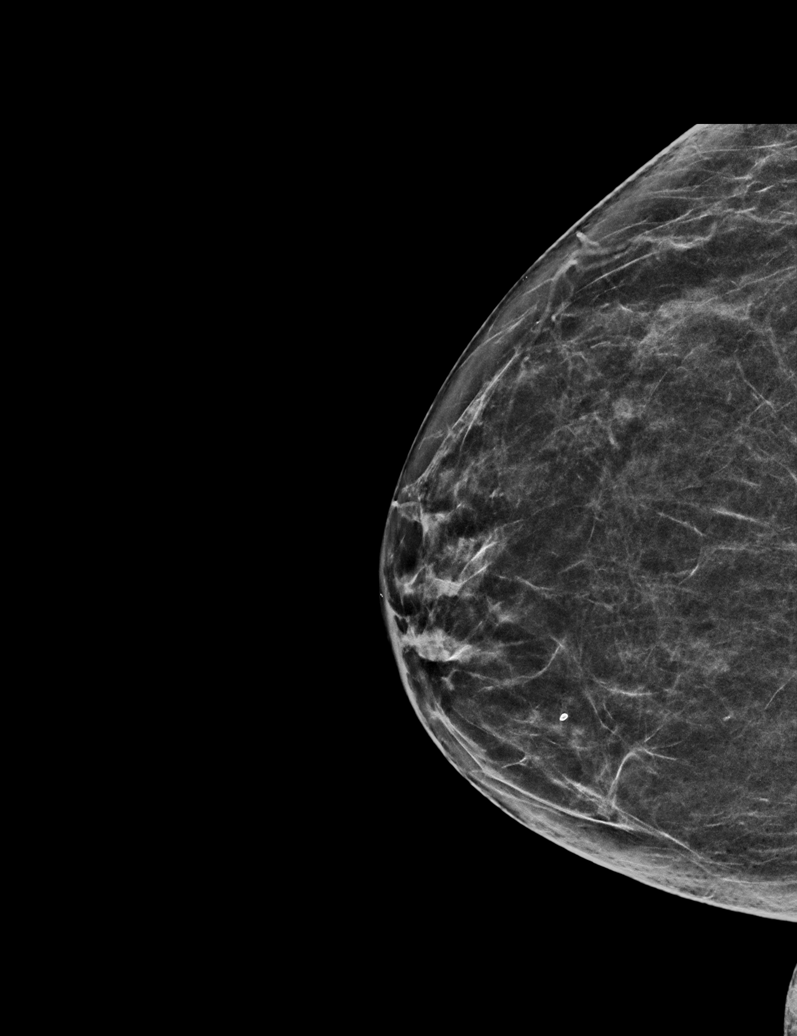

[L CC synth-2D]
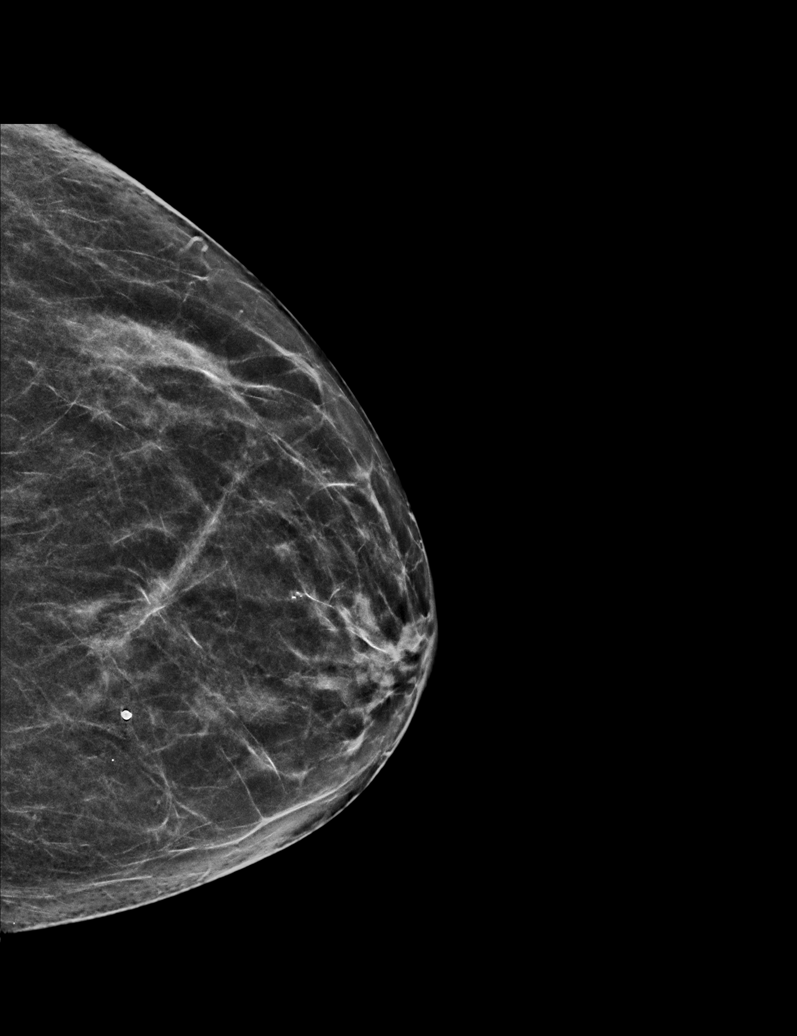

[L MLO synth-2D (1 of 2)]
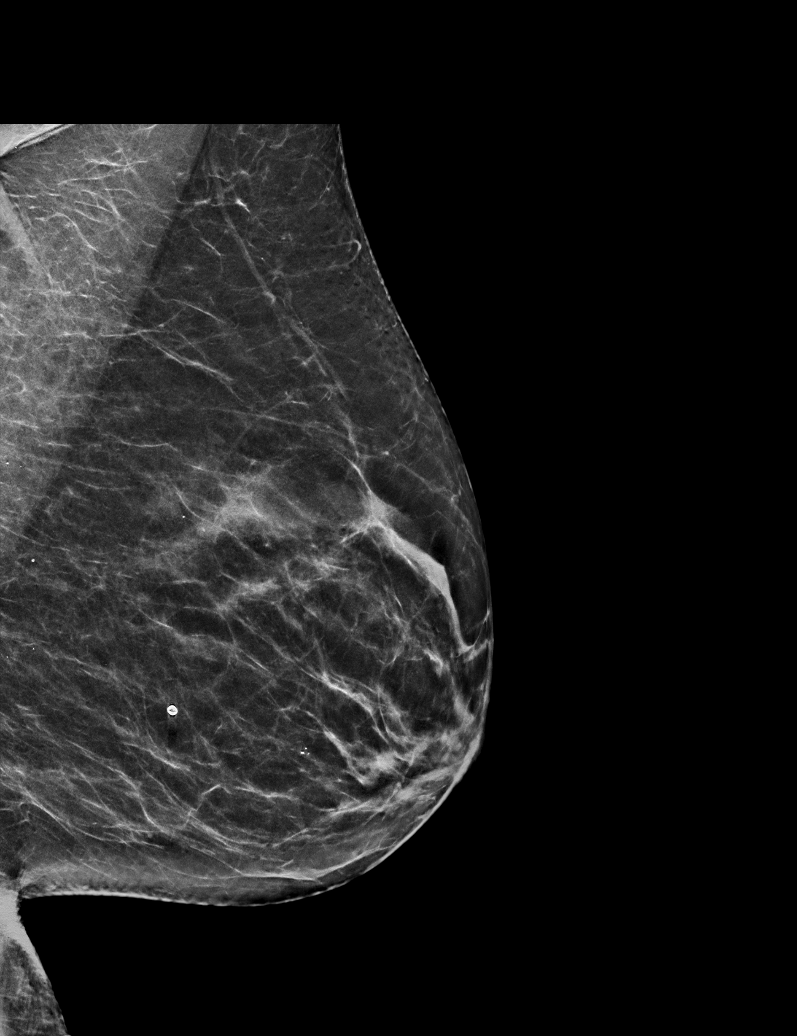

[L MLO synth-2D (2 of 2)]
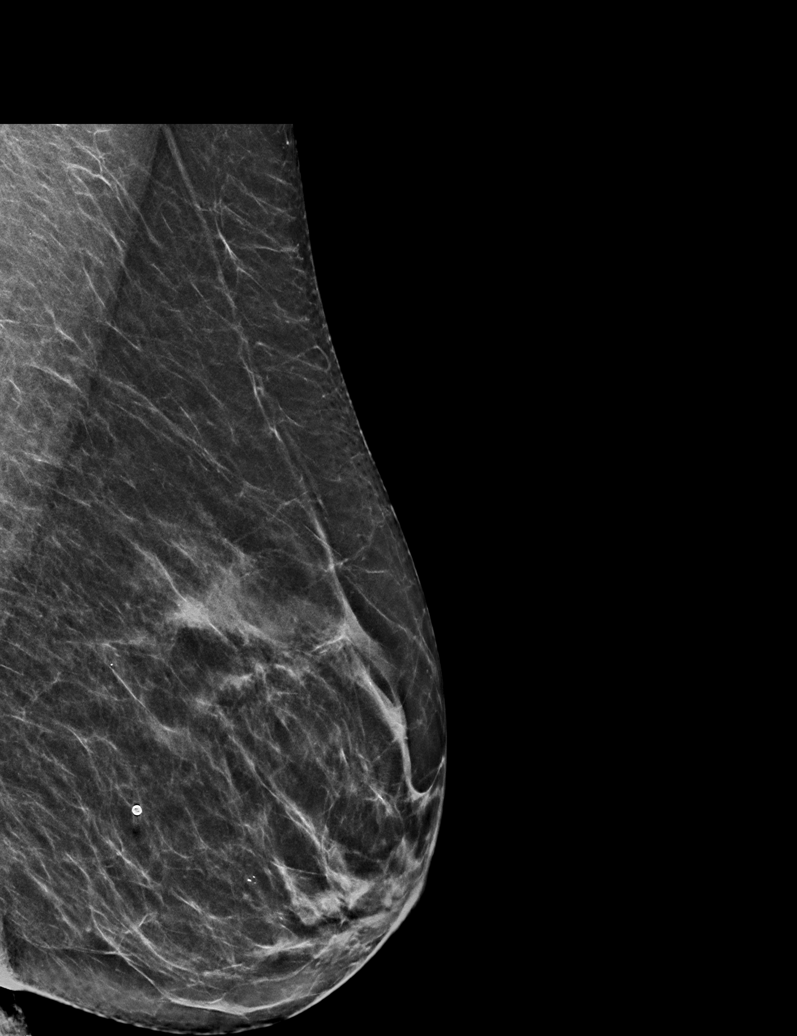

[R MLO synth-2D]
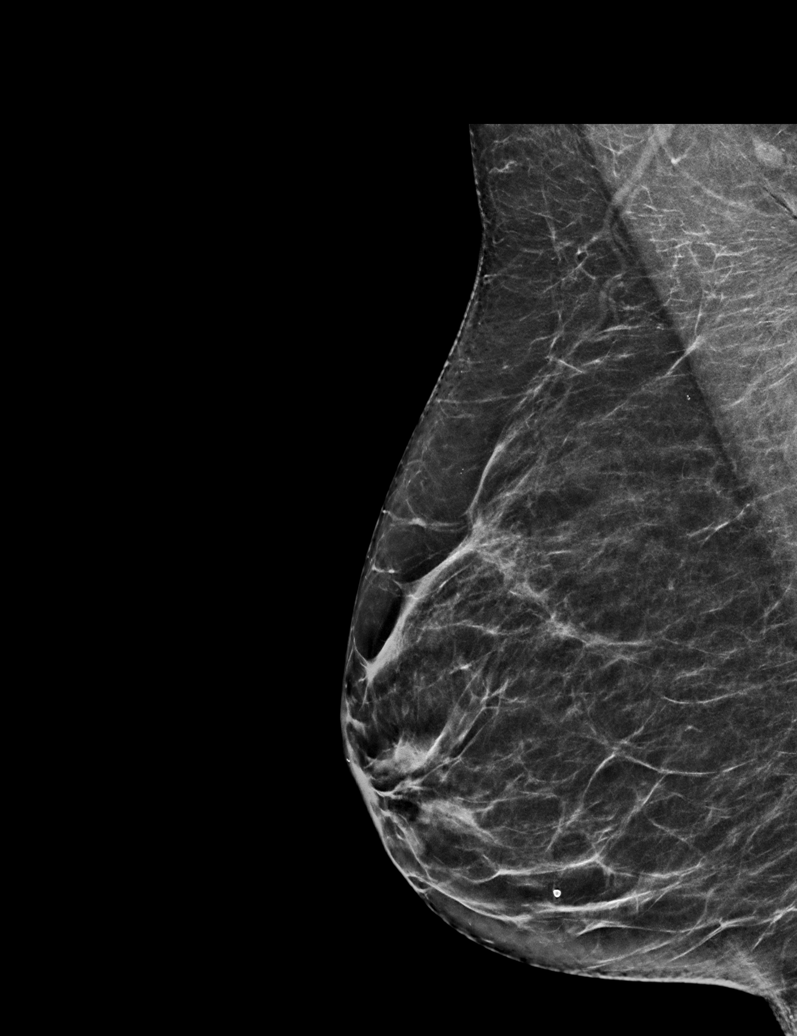

[L MLO tomo · tomo slice 29/57.0]
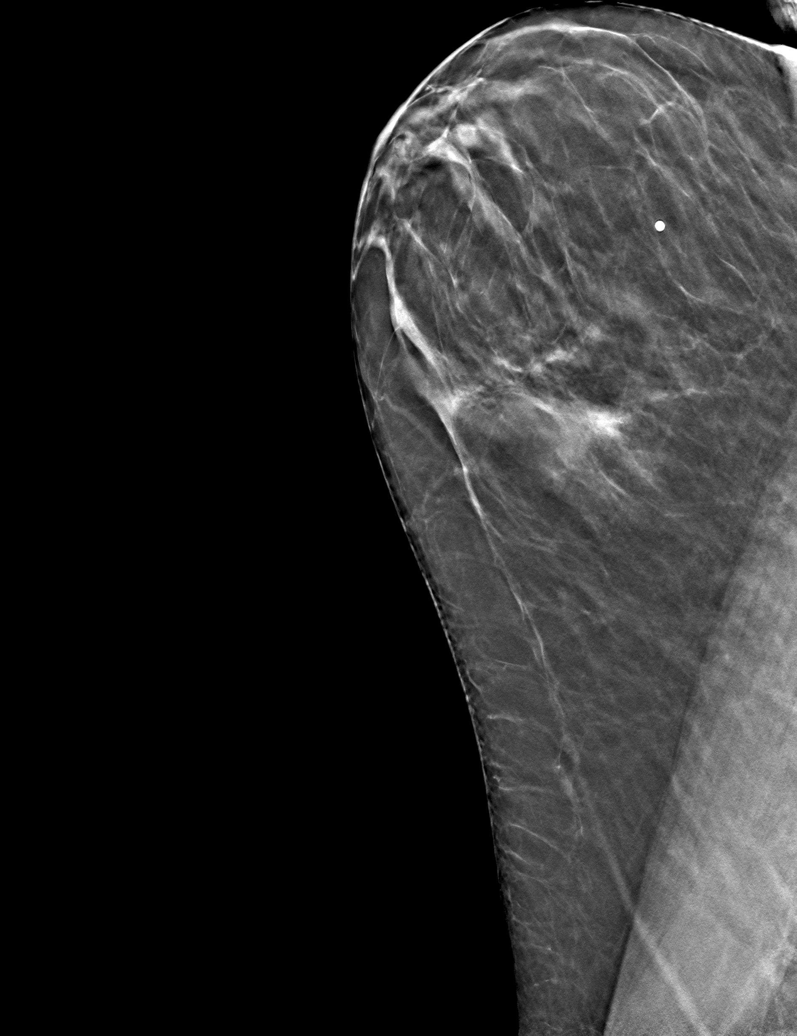

[6 of 30 positions shown; findings below may reference images not displayed]

ACR Breast Density Category b: There are scattered areas of
fibroglandular density.
FINDINGS: There are no findings suspicious for malignancy.
IMPRESSION: No mammographic evidence of malignancy. A result letter of this
screening mammogram will be mailed directly to the patient.

RECOMMENDATION:
Screening mammogram in one year. (Code:51-O-LD2)

BI-RADS CATEGORY  1: Negative.

## 2023-03-25 ENCOUNTER — Ambulatory Visit
Admission: RE | Admit: 2023-03-25 | Discharge: 2023-03-25 | Disposition: A | Discharge status: Home / Self Care | Payer: Medicare PPO | Source: Ambulatory Visit | Attending: Nurse Practitioner | Admitting: Nurse Practitioner

## 2023-03-25 DIAGNOSIS — Z1231 Encounter for screening mammogram for malignant neoplasm of breast: Secondary | ICD-10-CM | POA: Insufficient documentation

## 2023-04-29 DIAGNOSIS — Z961 Presence of intraocular lens: Secondary | ICD-10-CM | POA: Diagnosis not present

## 2023-04-29 DIAGNOSIS — H04123 Dry eye syndrome of bilateral lacrimal glands: Secondary | ICD-10-CM | POA: Diagnosis not present

## 2023-04-29 DIAGNOSIS — H35371 Puckering of macula, right eye: Secondary | ICD-10-CM | POA: Diagnosis not present

## 2023-04-29 DIAGNOSIS — H353131 Nonexudative age-related macular degeneration, bilateral, early dry stage: Secondary | ICD-10-CM | POA: Diagnosis not present

## 2023-06-02 ENCOUNTER — Emergency Department
Admission: EM | Admit: 2023-06-02 | Discharge: 2023-06-02 | Disposition: A | Discharge status: Home / Self Care | Attending: Emergency Medicine | Admitting: Emergency Medicine

## 2023-06-02 ENCOUNTER — Other Ambulatory Visit: Payer: Self-pay

## 2023-06-02 ENCOUNTER — Emergency Department

## 2023-06-02 DIAGNOSIS — R0789 Other chest pain: Secondary | ICD-10-CM | POA: Insufficient documentation

## 2023-06-02 DIAGNOSIS — Z79899 Other long term (current) drug therapy: Secondary | ICD-10-CM | POA: Insufficient documentation

## 2023-06-02 DIAGNOSIS — I10 Essential (primary) hypertension: Secondary | ICD-10-CM | POA: Diagnosis not present

## 2023-06-02 LAB — TROPONIN I (HIGH SENSITIVITY)
Troponin I (High Sensitivity): 3 ng/L (ref <18)
Troponin I (High Sensitivity): 4 ng/L (ref <18)

## 2023-06-02 LAB — CBC
HCT: 43.8 % (ref 36.0–46.0)
Hemoglobin: 14.5 g/dL (ref 12.0–15.0)
MCH: 30.5 pg (ref 26.0–34.0)
MCHC: 33.1 g/dL (ref 30.0–36.0)
MCV: 92 fL (ref 80.0–100.0)
Platelets: 198 10*3/uL (ref 150–400)
RBC: 4.76 MIL/uL (ref 3.87–5.11)
RDW: 13.2 % (ref 11.5–15.5)
WBC: 4 10*3/uL (ref 4.0–10.5)
nRBC: 0 % (ref 0.0–0.2)

## 2023-06-02 LAB — BASIC METABOLIC PANEL
Anion gap: 7 (ref 5–15)
BUN: 19 mg/dL (ref 8–23)
CO2: 27 mmol/L (ref 22–32)
Calcium: 9.4 mg/dL (ref 8.9–10.3)
Chloride: 105 mmol/L (ref 98–111)
Creatinine, Ser: 0.72 mg/dL (ref 0.44–1.00)
GFR, Estimated: >60 mL/min (ref >60)
Glucose, Bld: 107 mg/dL — ABNORMAL HIGH (ref 70–99)
Potassium: 4.3 mmol/L (ref 3.5–5.1)
Sodium: 139 mmol/L (ref 135–145)

## 2023-06-02 MED ORDER — LIDOCAINE VISCOUS HCL 2 % MT SOLN
15.0000 mL | Freq: Once | OROMUCOSAL | Status: AC
Start: 2023-06-02 — End: 2023-06-02
  Administered 2023-06-02: 15 mL via ORAL
  Filled 2023-06-02: qty 15

## 2023-06-02 MED ORDER — ALUM & MAG HYDROXIDE-SIMETH 200-200-20 MG/5ML PO SUSP
30.0000 mL | Freq: Once | ORAL | Status: AC
  Administered 2023-06-02: 30 mL via ORAL
  Filled 2023-06-02: qty 30

## 2023-06-02 NOTE — ED Notes (Signed)
 MD at bedside.

## 2023-06-02 NOTE — ED Notes (Signed)
 Pt transported to xray

## 2023-06-02 NOTE — ED Triage Notes (Signed)
 Pt here via [facility] from home with cp. Pt states pressure is 4/10. Pt states she had the same pain on Mon, relieved with rest. Pt states pain decreased on the way here to the ED. Pt recieved 324 mg of aspirin with ems.   62 132/86 112-[id number] 16

## 2023-06-02 NOTE — ED Notes (Signed)
 Pt verbalizes understanding of discharge instructions. Opportunity for questioning and answers were provided. Pt discharged from ED to home with family.

## 2023-06-02 NOTE — ED Provider Notes (Signed)
 [facility] Provider Note    Event Date/Time   First MD Initiated Contact with Patient 06/02/23 1053     (approximate)   History   Chest Pain   HPI  Connie Morrison is a 78 y.o. female with history of high blood pressure on amlodipine who presents with complaints of chest pressure.  Patient reports she developed chest pressure today while reading a book, it has improved but she still feels mild pressure in her chest.  Yesterday she felt lightheaded while working in the yard.  She did not have chest pressure at that time.  No recent calf pain or swelling.  No fevers or cough     Physical Exam   Triage Vital Signs: ED Triage Vitals  Encounter Vitals Group     BP 06/02/23 1056 (!) 152/65     Systolic BP Percentile --      Diastolic BP Percentile --      Pulse Rate 06/02/23 1056 61     Resp 06/02/23 1056 16     Temp 06/02/23 1056 98.2 F (36.8 C)     Temp Source 06/02/23 1056 Oral     SpO2 06/02/23 1056 100 %     Weight 06/02/23 1055 66.5 kg (146 lb 9.7 oz)     Height 06/02/23 1055 1.702 m (5\' 7" )     Head Circumference --      Peak Flow --      Pain Score 06/02/23 1055 2     Pain Loc --      Pain Education --      Exclude from Growth Chart --     Most recent vital signs: Vitals:   06/02/23 1300 06/02/23 1330  BP: (!) 133/57 (!) 143/62  Pulse: (!) 54 (!) 56  Resp: 17 13  Temp:    SpO2: 99% 98%     General: Awake, no distress.  CV:  Good peripheral perfusion.  Regular rate and rhythm Resp:  Normal effort.  Clear to auscultation bilaterally Abd:  No distention.  Other:  No calf pain or swelling   ED Results / Procedures / Treatments   Labs (all labs ordered are listed, but only abnormal results are displayed) Labs Reviewed  BASIC METABOLIC PANEL - Abnormal; Notable for the following components:      Result Value   Glucose, Bld 107 (*)    All other components within normal limits  CBC  TROPONIN I (HIGH SENSITIVITY)  TROPONIN I  (HIGH SENSITIVITY)     EKG  ED ECG REPORT I, Jene Every, the attending physician, personally viewed and interpreted this ECG.  Date: 06/02/2023  Rhythm: normal sinus rhythm QRS Axis: normal Intervals: normal ST/T Wave abnormalities: normal Narrative Interpretation: no evidence of acute ischemia    RADIOLOGY Chest x-ray viewed interpret by me, no acute abnormality    PROCEDURES:  Critical Care performed:   Procedures   MEDICATIONS ORDERED IN ED: Medications  alum & mag hydroxide-simeth (MAALOX/MYLANTA) 200-200-20 MG/5ML suspension 30 mL (30 mLs Oral Given 06/02/23 1222)    And  lidocaine (XYLOCAINE) 2 % viscous mouth solution 15 mL (15 mLs Oral Given 06/02/23 1222)     IMPRESSION / MDM / ASSESSMENT AND PLAN / ED COURSE  I reviewed the triage vital signs and the nursing notes. Patient's presentation is most consistent with acute presentation with potential threat to life or bodily function.  Patient presents with chest pressure as detailed above, differential includes ACS, angina, less likely  pneumonia, GERD  Are all well-appearing and in no acute distress, reassuring EKG, pending high sensitive troponin, chest x-ray, labs, will monitor carefully.  High sensitive troponin negative x 2, chest x-ray, labs reassuring, patient is asymptomatic did trial GI cocktail which may have helped.  Recommend trial of Prilosec, cardiology referral placed, no exertion until cleared by cardiology, strict return precautions, she and her family agree with this plan.     FINAL CLINICAL IMPRESSION(S) / ED DIAGNOSES   Final diagnoses:  Atypical chest pain     Rx / DC Orders   ED Discharge Orders          Ordered    Ambulatory referral to Cardiology       Comments: If you have not heard from the Cardiology office within the next 72 hours please call [phone].   06/02/23 1351             Note:  This document was prepared using Dragon voice recognition software and  may include unintentional dictation errors.   Jene Every, MD 06/02/23 1440

## 2023-06-03 ENCOUNTER — Other Ambulatory Visit: Payer: Self-pay | Admitting: Cardiology

## 2023-06-03 ENCOUNTER — Ambulatory Visit
Admission: RE | Admit: 2023-06-03 | Discharge: 2023-06-03 | Disposition: A | Discharge status: Home / Self Care | Source: Ambulatory Visit | Attending: Cardiology | Admitting: Cardiology

## 2023-06-03 DIAGNOSIS — R0602 Shortness of breath: Secondary | ICD-10-CM

## 2023-06-03 DIAGNOSIS — R0789 Other chest pain: Secondary | ICD-10-CM

## 2023-06-03 DIAGNOSIS — Z8249 Family history of ischemic heart disease and other diseases of the circulatory system: Secondary | ICD-10-CM

## 2023-06-03 MED ORDER — IOHEXOL 350 MG/ML SOLN
75.0000 mL | Freq: Once | INTRAVENOUS | Status: AC | PRN
  Administered 2023-06-03: 75 mL via INTRAVENOUS

## 2023-06-03 MED ORDER — DILTIAZEM HCL 25 MG/5ML IV SOLN: 10.0000 mg | INTRAVENOUS | Status: DC | PRN

## 2023-06-03 MED ORDER — METOPROLOL TARTRATE 5 MG/5ML IV SOLN
10.0000 mg | INTRAVENOUS | Status: DC | PRN
  Administered 2023-06-03: 10 mg via INTRAVENOUS

## 2023-06-03 MED ORDER — SODIUM CHLORIDE 0.9 % IV SOLN: INTRAVENOUS | Status: DC

## 2023-06-03 MED ORDER — SODIUM CHLORIDE 0.9 % IV BOLUS
150.0000 mL | Freq: Once | INTRAVENOUS | Status: AC
  Administered 2023-06-03: 150 mL via INTRAVENOUS

## 2023-06-03 MED ORDER — NITROGLYCERIN 0.4 MG SL SUBL
0.8000 mg | SUBLINGUAL_TABLET | Freq: Once | SUBLINGUAL | Status: AC
  Administered 2023-06-03: 0.8 mg via SUBLINGUAL

## 2023-06-03 NOTE — Progress Notes (Signed)
 Patient tolerated CT well. Drank water after. Vital signs stable encourage to drink water throughout day.Reasons explained and verbalized understanding. Ambulated steady gait.

## 2023-07-15 ENCOUNTER — Other Ambulatory Visit: Payer: Self-pay | Admitting: Nurse Practitioner

## 2023-07-15 NOTE — Telephone Encounter (Signed)
 Requested Prescriptions  Pending Prescriptions Disp Refills   amLODipine (NORVASC) 5 MG tablet [Pharmacy Med Name: AMLODIPINE BESYLATE 5 MG TAB] 90 tablet 0    Sig: TAKE 1 TABLET (5 MG TOTAL) BY MOUTH DAILY.     Cardiovascular: Calcium Channel Blockers 2 Failed - 07/15/2023  5:06 PM      Failed - Valid encounter within last 6 months    Recent Outpatient Visits   None     Future Appointments             In 1 month Aileen Alexanders, NP [facility]            Passed - Last BP in normal range    BP Readings from Last 1 Encounters:  06/03/23 124/68         Passed - Last Heart Rate in normal range    Pulse Readings from Last 1 Encounters:  06/03/23 64

## 2023-09-10 ENCOUNTER — Ambulatory Visit: Payer: Self-pay | Admitting: Nurse Practitioner

## 2023-09-10 ENCOUNTER — Encounter: Payer: Self-pay | Admitting: Nurse Practitioner

## 2023-09-10 VITALS — BP 110/67 | HR 57 | Ht 67.0 in | Wt 142.0 lb

## 2023-09-10 DIAGNOSIS — F325 Major depressive disorder, single episode, in full remission: Secondary | ICD-10-CM | POA: Diagnosis not present

## 2023-09-10 DIAGNOSIS — F419 Anxiety disorder, unspecified: Secondary | ICD-10-CM | POA: Diagnosis not present

## 2023-09-10 DIAGNOSIS — E7800 Pure hypercholesterolemia, unspecified: Secondary | ICD-10-CM

## 2023-09-10 DIAGNOSIS — R600 Localized edema: Secondary | ICD-10-CM | POA: Insufficient documentation

## 2023-09-10 DIAGNOSIS — I10 Essential (primary) hypertension: Secondary | ICD-10-CM

## 2023-09-10 DIAGNOSIS — R319 Hematuria, unspecified: Secondary | ICD-10-CM | POA: Insufficient documentation

## 2023-09-10 DIAGNOSIS — R0609 Other forms of dyspnea: Secondary | ICD-10-CM | POA: Insufficient documentation

## 2023-09-10 MED ORDER — ROSUVASTATIN CALCIUM 20 MG PO TABS: 20.0000 mg | ORAL_TABLET | Freq: Every day | ORAL | 1 refills | Status: DC

## 2023-09-10 MED ORDER — AMLODIPINE BESYLATE 5 MG PO TABS: 5.0000 mg | ORAL_TABLET | Freq: Every day | ORAL | 1 refills | Status: DC

## 2023-09-10 MED ORDER — FLUOXETINE HCL 20 MG PO CAPS: 20.0000 mg | ORAL_CAPSULE | Freq: Every day | ORAL | 1 refills | Status: DC

## 2023-09-10 NOTE — Progress Notes (Signed)
 BP 110/67   Pulse (!) 57   Ht 5' 7 (1.702 m)   Wt 142 lb (64.4 kg)   LMP 06/30/1991 (Approximate)   BMI 22.24 kg/m    Subjective:    Patient ID: Connie Morrison, female    DOB: [birth year], 78 y.o.   MRN: [id number]  HPI: Connie Morrison is a 78 y.o. female  Chief Complaint  Patient presents with   Medical Management of Chronic Issues   HYPERTENSION / HYPERLIPIDEMIA Satisfied with current treatment? no Duration of hypertension: years BP monitoring frequency: not checking BP range:  BP medication side effects: no Past BP meds: amlodipine  and PRN HCTZ Duration of hyperlipidemia: years Cholesterol medication side effects: no Cholesterol supplements: none Past cholesterol medications: rosuvastatin  (crestor ) Medication compliance: excellent Aspirin: no Recent stressors: no Recurrent headaches: no Visual changes: no Palpitations: no Dyspnea: no Chest pain: no Lower extremity edema: no Dizzy/lightheaded: no  ANXIETY/DEPRESSION Patient states her mood is okay.  She feels like the fluoxetine  is working well for her.  She does have a great grand daughter that helps a lot.  No thoughts of harming herself but does get sad.  Denies SI.     Relevant past medical, surgical, family and social history reviewed and updated as indicated. Interim medical history since our last visit reviewed. Allergies and medications reviewed and updated.  Review of Systems  Eyes:  Negative for visual disturbance.  Respiratory:  Negative for cough, chest tightness and shortness of breath.   Cardiovascular:  Negative for chest pain, palpitations and leg swelling.  Neurological:  Negative for dizziness and headaches.  Psychiatric/Behavioral:  Positive for dysphoric mood. Negative for suicidal ideas. The patient is nervous/anxious.     Per HPI unless specifically indicated above     Objective:    BP 110/67   Pulse (!) 57   Ht 5' 7 (1.702 m)   Wt 142 lb (64.4 kg)   LMP 06/30/1991  (Approximate)   BMI 22.24 kg/m   Wt Readings from Last 3 Encounters:  09/10/23 142 lb (64.4 kg)  06/02/23 146 lb 9.7 oz (66.5 kg)  03/11/23 146 lb 9.6 oz (66.5 kg)    Physical Exam Vitals and nursing note reviewed.  Constitutional:      General: She is not in acute distress.    Appearance: Normal appearance. She is normal weight. She is not ill-appearing, toxic-appearing or diaphoretic.  HENT:     Head: Normocephalic.     Right Ear: External ear normal.     Left Ear: External ear normal.     Nose: Nose normal.     Mouth/Throat:     Mouth: Mucous membranes are moist.     Pharynx: Oropharynx is clear.   Eyes:     General:        Right eye: No discharge.        Left eye: No discharge.     Extraocular Movements: Extraocular movements intact.     Conjunctiva/sclera: Conjunctivae normal.     Pupils: Pupils are equal, round, and reactive to light.    Cardiovascular:     Rate and Rhythm: Normal rate and regular rhythm.     Heart sounds: No murmur heard. Pulmonary:     Effort: Pulmonary effort is normal. No respiratory distress.     Breath sounds: Normal breath sounds. No wheezing or rales.   Musculoskeletal:     Cervical back: Normal range of motion and neck supple.   Skin:    General:  Skin is warm and dry.     Capillary Refill: Capillary refill takes less than 2 seconds.   Neurological:     General: No focal deficit present.     Mental Status: She is alert and oriented to person, place, and time. Mental status is at baseline.   Psychiatric:        Mood and Affect: Mood normal.        Behavior: Behavior normal.        Thought Content: Thought content normal.        Judgment: Judgment normal.     Results for orders placed or performed during the hospital encounter of 06/02/23  Basic metabolic panel   Collection Time: 06/02/23 10:55 AM  Result Value Ref Range   Sodium 139 135 - 145 mmol/L   Potassium 4.3 3.5 - 5.1 mmol/L   Chloride 105 98 - 111 mmol/L   CO2 27 22  - 32 mmol/L   Glucose, Bld 107 (H) 70 - 99 mg/dL   BUN 19 8 - 23 mg/dL   Creatinine, Ser 1.61 0.44 - 1.00 mg/dL   Calcium  9.4 8.9 - 10.3 mg/dL   GFR, Estimated >09 >60 mL/min   Anion gap 7 5 - 15  CBC   Collection Time: 06/02/23 10:55 AM  Result Value Ref Range   WBC 4.0 4.0 - 10.5 K/uL   RBC 4.76 3.87 - 5.11 MIL/uL   Hemoglobin 14.5 12.0 - 15.0 g/dL   HCT 45.4 09.8 - 11.9 %   MCV 92.0 80.0 - 100.0 fL   MCH 30.5 26.0 - 34.0 pg   MCHC 33.1 30.0 - 36.0 g/dL   RDW 14.7 82.9 - 56.2 %   Platelets 198 150 - 400 K/uL   nRBC 0.0 0.0 - 0.2 %  Troponin I (High Sensitivity)   Collection Time: 06/02/23 10:55 AM  Result Value Ref Range   Troponin I (High Sensitivity) 3 <18 ng/L  Troponin I (High Sensitivity)   Collection Time: 06/02/23  1:04 PM  Result Value Ref Range   Troponin I (High Sensitivity) 4 <18 ng/L      Assessment & Plan:   Problem List Items Addressed This Visit       Cardiovascular and Mediastinum   Hypertension   Chronic.  Controlled.  Continue with current medication regimen on Amlodipine  5mg  daily and HCTZ PRN. Rarely uses the HCTZ. Refills sent today of Amlodipine .  Does not need refills of HCTZ.  Labs ordered today.  Return to clinic in 6 months for reevaluation.  Call sooner if concerns arise.   Reviewed Cardiology note.  No need to return unless symptoms return.      Relevant Medications   amLODipine  (NORVASC ) 5 MG tablet   rosuvastatin  (CRESTOR ) 20 MG tablet   Other Relevant Orders   Comprehensive metabolic panel with GFR     Other   Acute anxiety   Chronic.  Controlled.  Continue with current medication regimen of Fluoxetine  20mg .  Refills sent today.  Labs ordered today.  Return to clinic in 6 months for reevaluation.  Call sooner if concerns arise.       Relevant Medications   FLUoxetine  (PROZAC ) 20 MG capsule   Hyperlipidemia   Chronic.  Controlled.  Continue with current medication regimen of Crestor  20mg  daily.  Refills sent today.  Labs ordered  today.  Return to clinic in 6 months for reevaluation.  Call sooner if concerns arise.       Relevant Medications  amLODipine  (NORVASC ) 5 MG tablet   rosuvastatin  (CRESTOR ) 20 MG tablet   Other Relevant Orders   Lipid panel   Depression, major, single episode, complete remission (HCC) - Primary   Chronic.  Controlled.  Continue with current medication regimen of Fluoxetine  20mg .  Refills sent today.  Labs ordered today.  Return to clinic in 6 months for reevaluation.  Call sooner if concerns arise.        Relevant Medications   FLUoxetine  (PROZAC ) 20 MG capsule     Follow up plan: Return in about 6 months (around 03/11/2024) for Physical and Fasting labs.

## 2023-09-10 NOTE — Assessment & Plan Note (Signed)
Chronic.  Controlled.  Continue with current medication regimen of Fluoxetine 20mg .  Refills sent today.  Labs ordered today.  Return to clinic in 6 months for reevaluation.  Call sooner if concerns arise.

## 2023-09-10 NOTE — Assessment & Plan Note (Addendum)
 Chronic.  Controlled.  Continue with current medication regimen on Amlodipine  5mg  daily and HCTZ PRN. Rarely uses the HCTZ. Refills sent today of Amlodipine .  Does not need refills of HCTZ.  Labs ordered today.  Return to clinic in 6 months for reevaluation.  Call sooner if concerns arise.   Reviewed Cardiology note.  No need to return unless symptoms return.

## 2023-09-10 NOTE — Assessment & Plan Note (Signed)
Chronic.  Controlled.  Continue with current medication regimen of Crestor 20mg  daily.  Refills sent today.  Labs ordered today.  Return to clinic in 6 months for reevaluation.  Call sooner if concerns arise.

## 2023-09-11 LAB — COMPREHENSIVE METABOLIC PANEL WITH GFR
ALT: 17 IU/L (ref 0–32)
AST: 23 IU/L (ref 0–40)
Albumin: 4.1 g/dL (ref 3.8–4.8)
Alkaline Phosphatase: 67 IU/L (ref 44–121)
BUN/Creatinine Ratio: 17 (ref 12–28)
BUN: 16 mg/dL (ref 8–27)
Bilirubin Total: 0.5 mg/dL (ref 0.0–1.2)
CO2: 24 mmol/L (ref 20–29)
Calcium: 9.2 mg/dL (ref 8.7–10.3)
Chloride: 103 mmol/L (ref 96–106)
Creatinine, Ser: 0.92 mg/dL (ref 0.57–1.00)
Globulin, Total: 2.3 g/dL (ref 1.5–4.5)
Glucose: 95 mg/dL (ref 70–99)
Potassium: 4.1 mmol/L (ref 3.5–5.2)
Sodium: 143 mmol/L (ref 134–144)
Total Protein: 6.4 g/dL (ref 6.0–8.5)
eGFR: 64 mL/min/{1.73_m2} (ref >59)

## 2023-09-11 LAB — LIPID PANEL
Chol/HDL Ratio: 2.3 ratio (ref 0.0–4.4)
Cholesterol, Total: 162 mg/dL (ref 100–199)
HDL: 70 mg/dL (ref >39)
LDL Chol Calc (NIH): 76 mg/dL (ref 0–99)
Triglycerides: 87 mg/dL (ref 0–149)
VLDL Cholesterol Cal: 16 mg/dL (ref 5–40)

## 2023-09-13 ENCOUNTER — Ambulatory Visit: Payer: Self-pay | Admitting: Nurse Practitioner

## 2024-02-16 ENCOUNTER — Ambulatory Visit: Payer: Self-pay

## 2024-02-16 NOTE — Telephone Encounter (Signed)
 FYI Only or Action Required?: FYI only for provider: appointment scheduled on 02/17/2024.  Patient was last seen in primary care on 09/10/2023 by Melvin Pao, NP.  Called Nurse Triage reporting tongue rash.  Symptoms began several days ago.  Interventions attempted: Nothing.  Symptoms are: unchanged.  Triage Disposition: See PCP When Office is Open (Within 3 Days)  Patient/caregiver understands and will follow disposition?: Yes     Copied from CRM [id number]. Topic: Clinical - Red Word Triage >> Feb 16, 2024 11:59 AM Alfonso HERO wrote: Red Word that prompted transfer to Nurse Triage: painful rash on tongue     Reason for Disposition  [1] MILD-MODERATE mouth pain AND [2] present > 3 days  Answer Assessment - Initial Assessment Questions 1. ONSET: When did your mouth start hurting? (e.g., hours or days ago)      Three days ago 2. SEVERITY: How bad is the pain? (Scale 1-10; or mild, moderate or severe)     moderate 3. SORES: Are there any sores or ulcers in the mouth? If Yes, ask: What part of the mouth are the sores in?     Not sores, but red and white bumps, 4. FEVER: Do you have a fever? If Yes, ask: What is your temperature, how was it measured, and when did it start?     N/a 5. CAUSE: What do you think is causing the mouth pain?     unknown 6. OTHER SYMPTOMS: Do you have any other symptoms? (e.g., difficulty breathing)     denies  Protocols used: Mouth Pain-A-AH

## 2024-02-16 NOTE — Telephone Encounter (Signed)
 This RN attempted to reach pt for triage, left VM  Copied from CRM [id number]. Topic: Clinical - Red Word Triage >> Feb 16, 2024 11:59 AM Alfonso HERO wrote: Red Word that prompted transfer to Nurse Triage: painful rash on tongue

## 2024-02-17 ENCOUNTER — Encounter: Payer: Self-pay | Admitting: Nurse Practitioner

## 2024-02-17 ENCOUNTER — Ambulatory Visit: Admitting: Nurse Practitioner

## 2024-02-17 VITALS — BP 122/69 | HR 59 | Temp 98.0°F | Ht 67.01 in | Wt 140.8 lb

## 2024-02-17 DIAGNOSIS — K146 Glossodynia: Secondary | ICD-10-CM

## 2024-02-17 MED ORDER — NYSTATIN 100000 UNIT/ML MT SUSP: 5.0000 mL | Freq: Four times a day (QID) | OROMUCOSAL | 0 refills | Status: DC

## 2024-02-17 MED ORDER — LIDOCAINE VISCOUS HCL 2 % MT SOLN: 15.0000 mL | OROMUCOSAL | 1 refills | Status: DC | PRN

## 2024-02-17 NOTE — Progress Notes (Signed)
 BP 122/69 (BP Location: Right Arm, Cuff Size: Normal)   Pulse (!) 59   Temp 98 F (36.7 C) (Oral)   Ht 5' 7.01 (1.702 m)   Wt 140 lb 12.8 oz (63.9 kg)   LMP 06/30/1991 (Approximate)   SpO2 96%   BMI 22.05 kg/m    Subjective:    Patient ID: Connie Morrison [employer], female    DOB: [birth year], 78 y.o.   MRN: [id number]  HPI: Connie Morrison is a 78 y.o. female  Chief Complaint  Patient presents with   Oral Pain    Rash on the tongue, patient stated it started Monday and it's gotten worse.   Patient states her mouth is sore.  She has bumps on the bottom.  It hurts to eat even when things are cold.  States symptoms started on Monday and have gotten worse.  She does have a slight sore throat.  Denies any changes to mouth products.  Denies any fever.      Relevant past medical, surgical, family and social history reviewed and updated as indicated. Interim medical history since our last visit reviewed. Allergies and medications reviewed and updated.  Review of Systems  HENT:         Bumps on tongue    Per HPI unless specifically indicated above     Objective:    BP 122/69 (BP Location: Right Arm, Cuff Size: Normal)   Pulse (!) 59   Temp 98 F (36.7 C) (Oral)   Ht 5' 7.01 (1.702 m)   Wt 140 lb 12.8 oz (63.9 kg)   LMP 06/30/1991 (Approximate)   SpO2 96%   BMI 22.05 kg/m   Wt Readings from Last 3 Encounters:  02/17/24 140 lb 12.8 oz (63.9 kg)  09/10/23 142 lb (64.4 kg)  06/02/23 146 lb 9.7 oz (66.5 kg)    Physical Exam Vitals and nursing note reviewed.  Constitutional:      General: She is not in acute distress.    Appearance: Normal appearance. She is normal weight. She is not ill-appearing, toxic-appearing or diaphoretic.  HENT:     Head: Normocephalic.     Right Ear: External ear normal.     Left Ear: External ear normal.     Nose: Nose normal.     Mouth/Throat:     Mouth: Mucous membranes are moist.     Pharynx: Oropharynx is clear.   Eyes:     General:         Right eye: No discharge.        Left eye: No discharge.     Extraocular Movements: Extraocular movements intact.     Conjunctiva/sclera: Conjunctivae normal.     Pupils: Pupils are equal, round, and reactive to light.  Cardiovascular:     Rate and Rhythm: Normal rate and regular rhythm.     Heart sounds: No murmur heard. Pulmonary:     Effort: Pulmonary effort is normal. No respiratory distress.     Breath sounds: Normal breath sounds. No wheezing or rales.  Musculoskeletal:     Cervical back: Normal range of motion and neck supple.  Skin:    General: Skin is warm and dry.     Capillary Refill: Capillary refill takes less than 2 seconds.  Neurological:     General: No focal deficit present.     Mental Status: She is alert and oriented to person, place, and time. Mental status is at baseline.  Psychiatric:  Mood and Affect: Mood normal.        Behavior: Behavior normal.        Thought Content: Thought content normal.        Judgment: Judgment normal.     Results for orders placed or performed in visit on 09/10/23  Comprehensive metabolic panel with GFR   Collection Time: 09/10/23  8:32 AM  Result Value Ref Range   Glucose 95 70 - 99 mg/dL   BUN 16 8 - 27 mg/dL   Creatinine, Ser 9.07 0.57 - 1.00 mg/dL   eGFR 64 >40 fO/fpw/8.26   BUN/Creatinine Ratio 17 12 - 28   Sodium 143 134 - 144 mmol/L   Potassium 4.1 3.5 - 5.2 mmol/L   Chloride 103 96 - 106 mmol/L   CO2 24 20 - 29 mmol/L   Calcium  9.2 8.7 - 10.3 mg/dL   Total Protein 6.4 6.0 - 8.5 g/dL   Albumin 4.1 3.8 - 4.8 g/dL   Globulin, Total 2.3 1.5 - 4.5 g/dL   Bilirubin Total 0.5 0.0 - 1.2 mg/dL   Alkaline Phosphatase 67 44 - 121 IU/L   AST 23 0 - 40 IU/L   ALT 17 0 - 32 IU/L  Lipid panel   Collection Time: 09/10/23  8:32 AM  Result Value Ref Range   Cholesterol, Total 162 100 - 199 mg/dL   Triglycerides 87 0 - 149 mg/dL   HDL 70 >60 mg/dL   VLDL Cholesterol Cal 16 5 - 40 mg/dL   LDL Chol Calc (NIH) 76 0 - 99  mg/dL   Chol/HDL Ratio 2.3 0.0 - 4.4 ratio      Assessment & Plan:   Problem List Items Addressed This Visit   None Visit Diagnoses       Tongue pain    -  Primary   Suspect it is related to fever blister that recently resolved. Will treat with nystatin and lidocaine  mouth wash.  Follow up if not improved.        Follow up plan: No follow-ups on file.

## 2024-03-16 ENCOUNTER — Ambulatory Visit: Admitting: Nurse Practitioner

## 2024-03-16 ENCOUNTER — Encounter: Payer: Self-pay | Admitting: Nurse Practitioner

## 2024-03-16 VITALS — BP 134/68 | HR 54 | Temp 97.6°F | Ht 67.0 in | Wt 139.2 lb

## 2024-03-16 DIAGNOSIS — Z0000 Encounter for general adult medical examination without abnormal findings: Secondary | ICD-10-CM | POA: Diagnosis not present

## 2024-03-16 DIAGNOSIS — I10 Essential (primary) hypertension: Secondary | ICD-10-CM

## 2024-03-16 DIAGNOSIS — F325 Major depressive disorder, single episode, in full remission: Secondary | ICD-10-CM | POA: Diagnosis not present

## 2024-03-16 DIAGNOSIS — E7800 Pure hypercholesterolemia, unspecified: Secondary | ICD-10-CM | POA: Diagnosis not present

## 2024-03-16 DIAGNOSIS — Z23 Encounter for immunization: Secondary | ICD-10-CM | POA: Diagnosis not present

## 2024-03-16 DIAGNOSIS — Z1231 Encounter for screening mammogram for malignant neoplasm of breast: Secondary | ICD-10-CM | POA: Diagnosis not present

## 2024-03-16 MED ORDER — FLUOXETINE HCL 20 MG PO CAPS: 20.0000 mg | ORAL_CAPSULE | Freq: Every day | ORAL | 1 refills | Status: AC

## 2024-03-16 MED ORDER — HYDROCHLOROTHIAZIDE 25 MG PO TABS: 12.5000 mg | ORAL_TABLET | ORAL | 0 refills | Status: AC | PRN

## 2024-03-16 MED ORDER — ROSUVASTATIN CALCIUM 20 MG PO TABS: 20.0000 mg | ORAL_TABLET | Freq: Every day | ORAL | 1 refills | Status: AC

## 2024-03-16 MED ORDER — AMLODIPINE BESYLATE 5 MG PO TABS: 5.0000 mg | ORAL_TABLET | Freq: Every day | ORAL | 1 refills | Status: AC

## 2024-03-16 NOTE — Assessment & Plan Note (Addendum)
 Chronic.  Controlled.  Continue with current medication regimen on Amlodipine  5mg  daily and HCTZ PRN. Rarely uses the HCTZ. Refills sent today.   Labs ordered today.  Return to clinic in 6 months for reevaluation.  Call sooner if concerns arise.  No need to return to Cardiology unless symptoms return.

## 2024-03-16 NOTE — Progress Notes (Signed)
 BP 134/68 (BP Location: Left Arm, Patient Position: Sitting, Cuff Size: Normal)   Pulse (!) 54   Temp 97.6 F (36.4 C) (Oral)   Ht 5' 7 (1.702 m)   Wt 139 lb 3.2 oz (63.1 kg)   LMP 06/30/1991   SpO2 96%   BMI 21.80 kg/m    Subjective:    Patient ID: Connie Morrison, female    DOB: 1946/03/23, 78 y.o.   MRN: [id number]  HPI: Connie Morrison is a 78 y.o. female presenting on 03/16/2024 for comprehensive medical examination. Current medical complaints include:none  She currently lives with: Menopausal Symptoms: no  HYPERTENSION / HYPERLIPIDEMIA Satisfied with current treatment? no Duration of hypertension: years BP monitoring frequency: not checking BP range:  BP medication side effects: no Past BP meds: amlodipine  and PRN HCTZ Duration of hyperlipidemia: years Cholesterol medication side effects: no Cholesterol supplements: none Past cholesterol medications: rosuvastatin  (crestor ) Medication compliance: excellent Aspirin: no Recent stressors: no Recurrent headaches: no Visual changes: no Palpitations: no Dyspnea: no Chest pain: no Lower extremity edema: no Dizzy/lightheaded: no  ANXIETY/DEPRESSION Patient states her mood is good and some days it is sad after losing her grandson.  She does have a great grand daughter that helps a lot.  No thoughts of harming herself but does get sad.  Denies SI. Feels like she is doing well with Fluoxetine  20mg .   Depression Screen done today and results listed below:     03/16/2024    8:40 AM 09/10/2023    8:01 AM 03/11/2023    2:31 PM 09/28/2022    8:12 AM 07/10/2022    8:22 AM  Depression screen PHQ 2/9  Decreased Interest 0 0 0 0 0  Down, Depressed, Hopeless 0 0 1 0 1  PHQ - 2 Score 0 0 1 0 1  Altered sleeping 1 1 1  0 1  Tired, decreased energy 1 0 1 0 0  Change in appetite 0 0 1 0 1  Feeling bad or failure about yourself  0 0 0 0 0  Trouble concentrating 0 0 0 0 0  Moving slowly or fidgety/restless 0 0 0 0 0  Suicidal  thoughts 0 0 0 0 0  PHQ-9 Score 2 1  4   0  3   Difficult doing work/chores Not difficult at all Not difficult at all  Not difficult at all      Data saved with a previous flowsheet row definition    The patient does not have a history of falls. I did complete a risk assessment for falls. A plan of care for falls was documented.   Past Medical History:  Past Medical History:  Diagnosis Date   Allergic rhinitis    Anginal pain    Anxiety    Complication of anesthesia    itching after anesthesia (50 yrs ago)   Depression    Dyspnea    (pt denies)   GERD (gastroesophageal reflux disease)    Hematuria    Hyperlipidemia    Hypertension    (pt denies)   Menopause    Microscopic hematuria    Vaginal atrophy    Wears dentures    lower partial    Surgical History:  Past Surgical History:  Procedure Laterality Date   BREAST SURGERY     cyst removed   CATARACT EXTRACTION W/PHACO Left 11/02/2018   Procedure: CATARACT EXTRACTION PHACO AND INTRAOCULAR LENS PLACEMENT (IOC)  LEFT;  Surgeon: Mittie Gaskin, MD;  Location: [facility];  Service: Ophthalmology;  Laterality: Left;   CATARACT EXTRACTION W/PHACO Right 11/23/2018   Procedure: CATARACT EXTRACTION PHACO AND INTRAOCULAR LENS PLACEMENT (IOC) - right  00:50.6  17.1;  Surgeon: Mittie Gaskin, MD;  Location: [facility];  Service: Ophthalmology;  Laterality: Right;   COLONOSCOPY     COLONOSCOPY WITH PROPOFOL  N/A 11/02/2017   Procedure: COLONOSCOPY WITH PROPOFOL ;  Surgeon: Gaylyn Gladis PENNER, MD;  Location: [facility];  Service: Endoscopy;  Laterality: N/A;   TONSILLECTOMY     TUMOR EXCISION Right    kidney    Medications:  Current Outpatient Medications on File Prior to Visit  Medication Sig   amLODipine  (NORVASC ) 5 MG tablet Take 1 tablet (5 mg total) by mouth daily.   diphenhydramine-acetaminophen  (TYLENOL  PM) 25-500 MG TABS tablet Take 0.5 tablets by mouth at bedtime as needed.   FLUoxetine   (PROZAC ) 20 MG capsule Take 1 capsule (20 mg total) by mouth daily.   hydrochlorothiazide  (HYDRODIURIL ) 25 MG tablet Take 0.5 tablets (12.5 mg total) by mouth as needed.   meloxicam (MOBIC) 15 MG tablet Take 1 tablet by mouth daily.   Multiple Vitamin (MULTIVITAMIN) tablet Take 1 tablet by mouth daily.   rosuvastatin  (CRESTOR ) 20 MG tablet Take 1 tablet (20 mg total) by mouth daily.   tretinoin (RETIN-A) 0.1 % cream APPLY PEA SIZE AMOUNT TO ENTIRE DRY FACE NIGHTLY   No current facility-administered medications on file prior to visit.    Allergies:  Allergies  Allergen Reactions   Cymbalta [Duloxetine Hcl] Other (See Comments)    constipation   Other     Other Reaction(s): Not available   Sulfa Antibiotics Rash    Social History:  Social History   Socioeconomic History   Marital status: Married    Spouse name: Not on file   Number of children: Not on file   Years of education: Not on file   Highest education level: Not on file  Occupational History   Occupation: retired   Tobacco Use   Smoking status: Former    Current packs/day: 0.00    Types: Cigarettes    Quit date: 1980    Years since quitting: 45.9   Smokeless tobacco: Never  Vaping Use   Vaping status: Never Used  Substance and Sexual Activity   Alcohol use: No    Alcohol/week: 0.0 standard drinks of alcohol   Drug use: No   Sexual activity: Yes  Other Topics Concern   Not on file  Social History Narrative   Not on file   Social Drivers of Health   Tobacco Use: Medium Risk (03/16/2024)   Patient History    Smoking Tobacco Use: Former    Smokeless Tobacco Use: Never    Passive Exposure: Not on Actuary Strain: Low Risk (09/28/2022)   Overall Financial Resource Strain (CARDIA)    Difficulty of Paying Living Expenses: Not hard at all  Food Insecurity: No Food Insecurity (03/16/2024)   Epic    Worried About Radiation Protection Practitioner of Food in the Last Year: Never true    Ran Out of Food in the Last  Year: Never true  Transportation Needs: No Transportation Needs (03/16/2024)   Epic    Lack of Transportation (Medical): No    Lack of Transportation (Non-Medical): No  Physical Activity: Sufficiently Active (03/16/2024)   Exercise Vital Sign    Days of Exercise per Week: 3 days    Minutes of Exercise per Session: 60 min  Stress: No Stress Concern Present (03/16/2024)  [employer] of Occupational Health - Occupational Stress Questionnaire    Feeling of Stress: Not at all  Social Connections: Moderately Integrated (03/16/2024)   Social Connection and Isolation Panel    Frequency of Communication with Friends and Family: More than three times a week    Frequency of Social Gatherings with Friends and Family: More than three times a week    Attends Religious Services: More than 4 times per year    Active Member of Clubs or Organizations: No    Attends Banker Meetings: Never    Marital Status: Married  Catering Manager Violence: Not At Risk (03/16/2024)   Epic    Fear of Current or Ex-Partner: No    Emotionally Abused: No    Physically Abused: No    Sexually Abused: No  Depression (PHQ2-9): Low Risk (03/16/2024)   Depression (PHQ2-9)    PHQ-2 Score: 2  Alcohol Screen: Low Risk (09/28/2022)   Alcohol Screen    Last Alcohol Screening Score (AUDIT): 0  Housing: Low Risk (03/16/2024)   Epic    Unable to Pay for Housing in the Last Year: No    Number of Times Moved in the Last Year: 0    Homeless in the Last Year: No  Utilities: Not At Risk (03/16/2024)   Epic    Threatened with loss of utilities: No  Health Literacy: Adequate Health Literacy (03/16/2024)   B1300 Health Literacy    Frequency of need for help with medical instructions: Never   Social History   Tobacco Use  Smoking Status Former   Current packs/day: 0.00   Types: Cigarettes   Quit date: 1980   Years since quitting: 45.9  Smokeless Tobacco Never   Social History   Substance and Sexual  Activity  Alcohol Use No   Alcohol/week: 0.0 standard drinks of alcohol    Family History:  Family History  Problem Relation Age of Onset   Heart disease Father    Cancer Sister        lung   Cancer Sister        lung   COPD Sister    Stroke Maternal Grandmother    Cancer Maternal Grandfather        lung   Cancer Brother        colon   Diabetes Brother    Heart disease Brother    Breast cancer Neg Hx     Past medical history, surgical history, medications, allergies, family history and social history reviewed with patient today and changes made to appropriate areas of the chart.   Review of Systems  Eyes:  Negative for blurred vision and double vision.  Respiratory:  Negative for shortness of breath.   Cardiovascular:  Negative for chest pain, palpitations and leg swelling.  Neurological:  Negative for dizziness and headaches.  Psychiatric/Behavioral:  Positive for depression. Negative for suicidal ideas. The patient is not nervous/anxious.    All other ROS negative except what is listed above and in the HPI.      Objective:    BP 134/68 (BP Location: Left Arm, Patient Position: Sitting, Cuff Size: Normal)   Pulse (!) 54   Temp 97.6 F (36.4 C) (Oral)   Ht 5' 7 (1.702 m)   Wt 139 lb 3.2 oz (63.1 kg)   LMP 06/30/1991   SpO2 96%   BMI 21.80 kg/m   Wt Readings from Last 3 Encounters:  03/16/24 139 lb 3.2 oz (63.1 kg)  02/17/24 140 lb  12.8 oz (63.9 kg)  09/10/23 142 lb (64.4 kg)    Physical Exam Vitals and nursing note reviewed.  Constitutional:      General: She is awake. She is not in acute distress.    Appearance: Normal appearance. She is well-developed. She is not ill-appearing.  HENT:     Head: Normocephalic and atraumatic.     Right Ear: Hearing, tympanic membrane, ear canal and external ear normal. No drainage.     Left Ear: Hearing, tympanic membrane, ear canal and external ear normal. No drainage.     Nose: Nose normal.     Right Sinus: No  maxillary sinus tenderness or frontal sinus tenderness.     Left Sinus: No maxillary sinus tenderness or frontal sinus tenderness.     Mouth/Throat:     Mouth: Mucous membranes are moist.     Pharynx: Oropharynx is clear. Uvula midline. No pharyngeal swelling, oropharyngeal exudate or posterior oropharyngeal erythema.  Eyes:     General: Lids are normal.        Right eye: No discharge.        Left eye: No discharge.     Extraocular Movements: Extraocular movements intact.     Conjunctiva/sclera: Conjunctivae normal.     Pupils: Pupils are equal, round, and reactive to light.     Visual Fields: Right eye visual fields normal and left eye visual fields normal.  Neck:     Thyroid : No thyromegaly.     Vascular: No carotid bruit.     Trachea: Trachea normal.  Cardiovascular:     Rate and Rhythm: Normal rate and regular rhythm.     Heart sounds: Normal heart sounds. No murmur heard.    No gallop.  Pulmonary:     Effort: Pulmonary effort is normal. No accessory muscle usage or respiratory distress.     Breath sounds: Normal breath sounds.  Chest:  Breasts:    Right: Normal.     Left: Normal.  Abdominal:     General: Bowel sounds are normal.     Palpations: Abdomen is soft. There is no hepatomegaly or splenomegaly.     Tenderness: There is no abdominal tenderness.  Musculoskeletal:        General: Normal range of motion.     Cervical back: Normal range of motion and neck supple.     Right lower leg: No edema.     Left lower leg: No edema.  Lymphadenopathy:     Head:     Right side of head: No submental, submandibular, tonsillar, preauricular or posterior auricular adenopathy.     Left side of head: No submental, submandibular, tonsillar, preauricular or posterior auricular adenopathy.     Cervical: No cervical adenopathy.     Upper Body:     Right upper body: No supraclavicular, axillary or pectoral adenopathy.     Left upper body: No supraclavicular, axillary or pectoral  adenopathy.  Skin:    General: Skin is warm and dry.     Capillary Refill: Capillary refill takes less than 2 seconds.     Findings: No rash.  Neurological:     Mental Status: She is alert and oriented to person, place, and time.     Gait: Gait is intact.     Deep Tendon Reflexes: Reflexes are normal and symmetric.     Reflex Scores:      Brachioradialis reflexes are 2+ on the right side and 2+ on the left side.      Patellar  reflexes are 2+ on the right side and 2+ on the left side. Psychiatric:        Attention and Perception: Attention normal.        Mood and Affect: Mood normal.        Speech: Speech normal.        Behavior: Behavior normal. Behavior is cooperative.        Thought Content: Thought content normal.        Judgment: Judgment normal.     Results for orders placed or performed in visit on 09/10/23  Comprehensive metabolic panel with GFR   Collection Time: 09/10/23  8:32 AM  Result Value Ref Range   Glucose 95 70 - 99 mg/dL   BUN 16 8 - 27 mg/dL   Creatinine, Ser 9.07 0.57 - 1.00 mg/dL   eGFR 64 >40 fO/fpw/8.26   BUN/Creatinine Ratio 17 12 - 28   Sodium 143 134 - 144 mmol/L   Potassium 4.1 3.5 - 5.2 mmol/L   Chloride 103 96 - 106 mmol/L   CO2 24 20 - 29 mmol/L   Calcium  9.2 8.7 - 10.3 mg/dL   Total Protein 6.4 6.0 - 8.5 g/dL   Albumin 4.1 3.8 - 4.8 g/dL   Globulin, Total 2.3 1.5 - 4.5 g/dL   Bilirubin Total 0.5 0.0 - 1.2 mg/dL   Alkaline Phosphatase 67 44 - 121 IU/L   AST 23 0 - 40 IU/L   ALT 17 0 - 32 IU/L  Lipid panel   Collection Time: 09/10/23  8:32 AM  Result Value Ref Range   Cholesterol, Total 162 100 - 199 mg/dL   Triglycerides 87 0 - 149 mg/dL   HDL 70 >60 mg/dL   VLDL Cholesterol Cal 16 5 - 40 mg/dL   LDL Chol Calc (NIH) 76 0 - 99 mg/dL   Chol/HDL Ratio 2.3 0.0 - 4.4 ratio      Assessment & Plan:   Problem List Items Addressed This Visit       Cardiovascular and Mediastinum   Hypertension   Chronic.  Controlled.  Continue with  current medication regimen on Amlodipine  5mg  daily and HCTZ PRN. Rarely uses the HCTZ. Refills sent today.   Labs ordered today.  Return to clinic in 6 months for reevaluation.  Call sooner if concerns arise.  No need to return to Cardiology unless symptoms return.        Other   Hyperlipidemia   Chronic.  Controlled.  Continue with current medication regimen of Crestor  20mg  daily.  Refills sent today.  Labs ordered today.  Return to clinic in 6 months for reevaluation.  Call sooner if concerns arise.       Relevant Orders   Lipid panel   Depression, major, single episode, complete remission   Chronic.  Controlled.  Continue with current medication regimen of Fluoxetine  20mg .  Good regimen for patient.  Does not feel like she needs to increase at this time.  Refills sent today.  Labs ordered today.  Return to clinic in 6 months for reevaluation.  Call sooner if concerns arise.       Other Visit Diagnoses       Encounter for Medicare annual wellness exam    -  Primary     Annual physical exam       Health maintenance reviewed during visit today.  Labs ordered.  Vaccines reveiwed.  Patient would like to continue to get mammograms.   Relevant Orders   CBC with  Differential/Platelet   Comprehensive metabolic panel with GFR   Lipid panel   TSH     Flu vaccine need       Relevant Orders   Flu vaccine HIGH DOSE PF(Fluzone Trivalent)     Encounter for screening mammogram for malignant neoplasm of breast       Relevant Orders   MM 3D SCREENING MAMMOGRAM BILATERAL BREAST        Follow up plan: Return in about 6 months (around 09/14/2024) for HTN, HLD, DM2 FU.   LABORATORY TESTING:  - Pap smear: not applicable  IMMUNIZATIONS:   - Tdap: Tetanus vaccination status reviewed: last tetanus booster within 10 years. - Influenza: Administered today - Pneumovax: Up to date - Prevnar: Up to date - HPV: Not applicable - Zostavax vaccine: Up to date  SCREENING: -Mammogram: scheduled this  morning - Colonoscopy: Up to date  - Bone Density: Ordered today  -Hearing Test: Not applicable  -Spirometry: Not applicable   PATIENT COUNSELING:   Advised to take 1 mg of folate supplement per day if capable of pregnancy.   Sexuality: Discussed sexually transmitted diseases, partner selection, use of condoms, avoidance of unintended pregnancy  and contraceptive alternatives.   Advised to avoid cigarette smoking.  I discussed with the patient that most people either abstain from alcohol or drink within safe limits (<=14/week and <=4 drinks/occasion for males, <=7/weeks and <= 3 drinks/occasion for females) and that the risk for alcohol disorders and other health effects rises proportionally with the number of drinks per week and how often a drinker exceeds daily limits.  Discussed cessation/primary prevention of drug use and availability of treatment for abuse.   Diet: Encouraged to adjust caloric intake to maintain  or achieve ideal body weight, to reduce intake of dietary saturated fat and total fat, to limit sodium intake by avoiding high sodium foods and not adding table salt, and to maintain adequate dietary potassium and calcium  preferably from fresh fruits, vegetables, and low-fat dairy products.    stressed the importance of regular exercise  Injury prevention: Discussed safety belts, safety helmets, smoke detector, smoking near bedding or upholstery.   Dental health: Discussed importance of regular tooth brushing, flossing, and dental visits.    NEXT PREVENTATIVE PHYSICAL DUE IN 1 YEAR. Return in about 6 months (around 09/14/2024) for HTN, HLD, DM2 FU.

## 2024-03-16 NOTE — Progress Notes (Signed)
 Chief Complaint  Patient presents with   Annual Exam    6 month F/u     Subjective:   Connie Morrison is a 78 y.o. female who presents for a Medicare Annual Wellness Visit.  Visit info / Clinical Intake: Medicare Wellness Visit Type:: Subsequent Annual Wellness Visit Persons participating in visit and providing information:: patient Medicare Wellness Visit Mode:: In-person (required for WTM) Interpreter Needed?: No Pre-visit prep was completed: yes AWV questionnaire completed by patient prior to visit?: no Living arrangements:: lives with spouse/significant other Patient's Overall Health Status Rating: very good Typical amount of pain: some Does pain affect daily life?: no Are you currently prescribed opioids?: no  Dietary Habits and Nutritional Risks How many meals a day?: 3 Eats fruit and vegetables daily?: yes Most meals are obtained by: preparing own meals In the last 2 weeks, have you had any of the following?: none Diabetic:: no  Functional Status Activities of Daily Living (to include ambulation/medication): Independent Ambulation: Independent Medication Administration: Independent Home Management (perform basic housework or laundry): Independent Manage your own finances?: yes Primary transportation is: driving Concerns about vision?: no *vision screening is required for WTM* Concerns about hearing?: no  Fall Screening Falls in the past year?: 0 Number of falls in past year: 0 Was there an injury with Fall?: 0 Fall Risk Category Calculator: 0 Patient Fall Risk Level: Low Fall Risk  Fall Risk Patient at Risk for Falls Due to: No Fall Risks Fall risk Follow up: Falls evaluation completed  Home and Transportation Safety: All rugs have non-skid backing?: yes All stairs or steps have railings?: N/A, no stairs Grab bars in the bathtub or shower?: (!) no Have non-skid surface in bathtub or shower?: yes Good home lighting?: yes Regular seat belt use?:  yes Hospital stays in the last year:: no  Cognitive Assessment Difficulty concentrating, remembering, or making decisions? : no Will 6CIT or Mini Cog be Completed: yes What year is it?: 0 points What month is it?: 0 points Give patient an address phrase to remember (5 components): [address] [place] what time is it?: 0 points Count backwards from 20 to 1: 0 points Say the months of the year in reverse: 0 points Repeat the address phrase from earlier: 0 points 6 CIT Score: 0 points  Advance Directives (For Healthcare) Does Patient Have a Medical Advance Directive?: Yes Would patient like information on creating a medical advance directive?: No - Patient declined  Reviewed/Updated  Reviewed/Updated: Reviewed All (Medical, Surgical, Family, Medications, Allergies, Care Teams, Patient Goals); Medical History; Surgical History; Family History; Medications; Care Teams; Patient Goals; Allergies    Allergies (verified) Cymbalta [duloxetine hcl], Other, and Sulfa antibiotics   Current Medications (verified) Outpatient Encounter Medications as of 03/16/2024  Medication Sig   amLODipine  (NORVASC ) 5 MG tablet Take 1 tablet (5 mg total) by mouth daily.   diphenhydramine-acetaminophen  (TYLENOL  PM) 25-500 MG TABS tablet Take 0.5 tablets by mouth at bedtime as needed.   FLUoxetine  (PROZAC ) 20 MG capsule Take 1 capsule (20 mg total) by mouth daily.   hydrochlorothiazide  (HYDRODIURIL ) 25 MG tablet Take 0.5 tablets (12.5 mg total) by mouth as needed.   meloxicam (MOBIC) 15 MG tablet Take 1 tablet by mouth daily.   Multiple Vitamin (MULTIVITAMIN) tablet Take 1 tablet by mouth daily.   rosuvastatin  (CRESTOR ) 20 MG tablet Take 1 tablet (20 mg total) by mouth daily.   tretinoin (RETIN-A) 0.1 % cream APPLY PEA SIZE AMOUNT TO ENTIRE DRY FACE NIGHTLY  lidocaine  (XYLOCAINE ) 2 % solution Use as directed 15 mLs in the mouth or throat as needed for mouth pain. (Patient not taking: Reported on  03/16/2024)   nystatin  (MYCOSTATIN ) 100000 UNIT/ML suspension Take 5 mLs (500,000 Units total) by mouth 4 (four) times daily. (Patient not taking: Reported on 03/16/2024)   No facility-administered encounter medications on file as of 03/16/2024.    History: Past Medical History:  Diagnosis Date   Allergic rhinitis    Anginal pain    Anxiety    Complication of anesthesia    itching after anesthesia (50 yrs ago)   Depression    Dyspnea    (pt denies)   GERD (gastroesophageal reflux disease)    Hematuria    Hyperlipidemia    Hypertension    (pt denies)   Menopause    Microscopic hematuria    Vaginal atrophy    Wears dentures    lower partial   Past Surgical History:  Procedure Laterality Date   BREAST SURGERY     cyst removed   CATARACT EXTRACTION W/PHACO Left 11/02/2018   Procedure: CATARACT EXTRACTION PHACO AND INTRAOCULAR LENS PLACEMENT (IOC)  LEFT;  Surgeon: Mittie Gaskin, MD;  Location: [facility];  Service: Ophthalmology;  Laterality: Left;   CATARACT EXTRACTION W/PHACO Right 11/23/2018   Procedure: CATARACT EXTRACTION PHACO AND INTRAOCULAR LENS PLACEMENT (IOC) - right  00:50.6  17.1;  Surgeon: Mittie Gaskin, MD;  Location: [facility];  Service: Ophthalmology;  Laterality: Right;   COLONOSCOPY     COLONOSCOPY WITH PROPOFOL  N/A 11/02/2017   Procedure: COLONOSCOPY WITH PROPOFOL ;  Surgeon: Gaylyn Gladis PENNER, MD;  Location: [facility];  Service: Endoscopy;  Laterality: N/A;   TONSILLECTOMY     TUMOR EXCISION Right    kidney   Family History  Problem Relation Age of Onset   Heart disease Father    Cancer Sister        lung   Cancer Sister        lung   COPD Sister    Stroke Maternal Grandmother    Cancer Maternal Grandfather        lung   Cancer Brother        colon   Diabetes Brother    Heart disease Brother    Breast cancer Neg Hx    Social History   Occupational History   Occupation: retired   Tobacco Use   Smoking  status: Former    Current packs/day: 0.00    Types: Cigarettes    Quit date: 1980    Years since quitting: 45.9   Smokeless tobacco: Never  Vaping Use   Vaping status: Never Used  Substance and Sexual Activity   Alcohol use: No    Alcohol/week: 0.0 standard drinks of alcohol   Drug use: No   Sexual activity: Yes   Tobacco Counseling Counseling given: Not Answered  SDOH Screenings   Food Insecurity: No Food Insecurity (03/16/2024)  Housing: Low Risk (03/16/2024)  Transportation Needs: No Transportation Needs (03/16/2024)  Utilities: Not At Risk (03/16/2024)  Alcohol Screen: Low Risk (09/28/2022)  Depression (PHQ2-9): Low Risk (09/10/2023)  Financial Resource Strain: Low Risk (09/28/2022)  Physical Activity: Sufficiently Active (03/16/2024)  Social Connections: Moderately Integrated (03/16/2024)  Stress: No Stress Concern Present (03/16/2024)  Tobacco Use: Medium Risk (03/16/2024)  Health Literacy: Adequate Health Literacy (03/16/2024)   See flowsheets for full screening details  Depression Screen PHQ 2 & 9 Depression Scale- Over the past 2 weeks, how often have you been  bothered by any of the following problems? Little interest or pleasure in doing things: 0 Feeling down, depressed, or hopeless (PHQ Adolescent also includes...irritable): 0 PHQ-2 Total Score: 0 Trouble falling or staying asleep, or sleeping too much: 1 Feeling tired or having little energy: 0 Poor appetite or overeating (PHQ Adolescent also includes...weight loss): 0 Feeling bad about yourself - or that you are a failure or have let yourself or your family down: 0 Trouble concentrating on things, such as reading the newspaper or watching television (PHQ Adolescent also includes...like school work): 0 Moving or speaking so slowly that other people could have noticed. Or the opposite - being so fidgety or restless that you have been moving around a lot more than usual: 0 Thoughts that you would be better off dead,  or of hurting yourself in some way: 0 PHQ-9 Total Score: 1 If you checked off any problems, how difficult have these problems made it for you to do your work, take care of things at home, or get along with other people?: Not difficult at all     Goals Addressed               This Visit's Progress     stay healthy (pt-stated)               Objective:    Today's Vitals   03/16/24 0823  BP: 134/68  Pulse: (!) 54  Temp: 97.6 F (36.4 C)  TempSrc: Oral  SpO2: 96%  Weight: 139 lb 3.2 oz (63.1 kg)  Height: 5' 7 (1.702 m)  PainSc: 0-No pain   Body mass index is 21.8 kg/m.  Hearing/Vision screen No results found. Immunizations and Health Maintenance Health Maintenance  Topic Date Due   Influenza Vaccine  10/29/2023   Mammogram  03/24/2024   COVID-19 Vaccine (3 - 2025-26 season) 04/01/2024 (Originally 11/29/2023)   DTaP/Tdap/Td (2 - Tdap) 09/09/2024 (Originally 12/04/2014)   Medicare Annual Wellness (AWV)  03/16/2025   Pneumococcal Vaccine: 50+ Years  Completed   Bone Density Scan  Completed   Hepatitis C Screening  Completed   Zoster Vaccines- Shingrix  Completed   Meningococcal B Vaccine  Aged Out   Colonoscopy  Discontinued        Assessment/Plan:  This is a routine wellness examination for Lillis.  Patient Care Team: Melvin Pao, NP as PCP - General  I have personally reviewed and noted the following in the patients chart:   Medical and social history Use of alcohol, tobacco or illicit drugs  Current medications and supplements including opioid prescriptions. Functional ability and status Nutritional status Physical activity Advanced directives List of other physicians Hospitalizations, surgeries, and ER visits in previous 12 months Vitals Screenings to include cognitive, depression, and falls Referrals and appointments  No orders of the defined types were placed in this encounter.  In addition, I have reviewed and discussed with patient  certain preventive protocols, quality metrics, and best practice recommendations. A written personalized care plan for preventive services as well as general preventive health recommendations were provided to patient.   Joya GORMAN Louder, CMA   03/16/2024   No follow-ups on file.  After Visit Summary: (In Person-Printed) AVS printed and given to the patient

## 2024-03-16 NOTE — Assessment & Plan Note (Signed)
 Chronic.  Controlled.  Continue with current medication regimen of Fluoxetine  20mg .  Good regimen for patient.  Does not feel like she needs to increase at this time.  Refills sent today.  Labs ordered today.  Return to clinic in 6 months for reevaluation.  Call sooner if concerns arise.

## 2024-03-16 NOTE — Assessment & Plan Note (Signed)
Chronic.  Controlled.  Continue with current medication regimen of Crestor 20mg  daily.  Refills sent today.  Labs ordered today.  Return to clinic in 6 months for reevaluation.  Call sooner if concerns arise.

## 2024-03-17 ENCOUNTER — Ambulatory Visit: Payer: Self-pay | Admitting: Nurse Practitioner

## 2024-03-17 LAB — COMPREHENSIVE METABOLIC PANEL WITH GFR
ALT: 15 IU/L (ref 0–32)
AST: 26 IU/L (ref 0–40)
Albumin: 4.1 g/dL (ref 3.8–4.8)
Alkaline Phosphatase: 58 IU/L (ref 49–135)
BUN/Creatinine Ratio: 24 (ref 12–28)
BUN: 18 mg/dL (ref 8–27)
Bilirubin Total: 0.5 mg/dL (ref 0.0–1.2)
CO2: 26 mmol/L (ref 20–29)
Calcium: 9.1 mg/dL (ref 8.7–10.3)
Chloride: 103 mmol/L (ref 96–106)
Creatinine, Ser: 0.76 mg/dL (ref 0.57–1.00)
Globulin, Total: 2.2 g/dL (ref 1.5–4.5)
Glucose: 98 mg/dL (ref 70–99)
Potassium: 3.8 mmol/L (ref 3.5–5.2)
Sodium: 142 mmol/L (ref 134–144)
Total Protein: 6.3 g/dL (ref 6.0–8.5)
eGFR: 80 mL/min/1.73

## 2024-03-17 LAB — TSH: TSH: 1.6 u[IU]/mL (ref 0.450–4.500)

## 2024-03-17 LAB — LIPID PANEL
Chol/HDL Ratio: 2.1 ratio (ref 0.0–4.4)
Cholesterol, Total: 165 mg/dL (ref 100–199)
HDL: 77 mg/dL
LDL Chol Calc (NIH): 75 mg/dL (ref 0–99)
Triglycerides: 65 mg/dL (ref 0–149)
VLDL Cholesterol Cal: 13 mg/dL (ref 5–40)

## 2024-03-17 LAB — CBC WITH DIFFERENTIAL/PLATELET
Basophils Absolute: 0 x10E3/uL (ref 0.0–0.2)
Basos: 1 %
EOS (ABSOLUTE): 0.1 x10E3/uL (ref 0.0–0.4)
Eos: 3 %
Hematocrit: 40.9 % (ref 34.0–46.6)
Hemoglobin: 13.2 g/dL (ref 11.1–15.9)
Immature Grans (Abs): 0 x10E3/uL (ref 0.0–0.1)
Immature Granulocytes: 0 %
Lymphocytes Absolute: 1 x10E3/uL (ref 0.7–3.1)
Lymphs: 30 %
MCH: 30.4 pg (ref 26.6–33.0)
MCHC: 32.3 g/dL (ref 31.5–35.7)
MCV: 94 fL (ref 79–97)
Monocytes Absolute: 0.5 x10E3/uL (ref 0.1–0.9)
Monocytes: 15 %
Neutrophils Absolute: 1.7 x10E3/uL (ref 1.4–7.0)
Neutrophils: 51 %
Platelets: 191 x10E3/uL (ref 150–450)
RBC: 4.34 x10E6/uL (ref 3.77–5.28)
RDW: 13.2 % (ref 11.7–15.4)
WBC: 3.3 x10E3/uL — ABNORMAL LOW (ref 3.4–10.8)

## 2024-05-03 ENCOUNTER — Ambulatory Visit (INDEPENDENT_AMBULATORY_CARE_PROVIDER_SITE_OTHER): Admitting: Nurse Practitioner

## 2024-05-03 ENCOUNTER — Ambulatory Visit: Payer: Self-pay

## 2024-05-03 ENCOUNTER — Encounter: Payer: Self-pay | Admitting: Nurse Practitioner

## 2024-05-03 ENCOUNTER — Ambulatory Visit: Payer: Self-pay | Admitting: Nurse Practitioner

## 2024-05-03 VITALS — BP 125/71 | HR 66 | Temp 97.7°F | Ht 67.01 in | Wt 140.6 lb

## 2024-05-03 DIAGNOSIS — R051 Acute cough: Secondary | ICD-10-CM

## 2024-05-03 DIAGNOSIS — J0110 Acute frontal sinusitis, unspecified: Secondary | ICD-10-CM

## 2024-05-03 DIAGNOSIS — J029 Acute pharyngitis, unspecified: Secondary | ICD-10-CM

## 2024-05-03 LAB — VERITOR FLU A/B WAIVED
Influenza A: NEGATIVE
Influenza B: NEGATIVE

## 2024-05-03 LAB — POC COVID19 BINAXNOW: SARS Coronavirus 2 Ag: NEGATIVE

## 2024-05-03 MED ORDER — AMOXICILLIN-POT CLAVULANATE 875-125 MG PO TABS: 1.0000 | ORAL_TABLET | Freq: Two times a day (BID) | ORAL | 0 refills | Status: AC

## 2024-05-03 NOTE — Telephone Encounter (Addendum)
 If patient would like to be seen I can see her at 1020 this morning.  I already booked the appt to hold the spot.

## 2024-05-03 NOTE — Progress Notes (Signed)
 "  BP 125/71 (BP Location: Left Arm, Patient Position: Sitting, Cuff Size: Normal)   Pulse 66   Temp 97.7 F (36.5 C) (Oral)   Ht 5' 7.01 (1.702 m)   Wt 140 lb 9.6 oz (63.8 kg)   LMP 06/30/1991   SpO2 96%   BMI 22.02 kg/m    Subjective:    Patient ID: Connie Morrison Baptist, female    DOB: [birth year], 79 y.o.   MRN: [id number]  HPI: Connie Morrison is a 79 y.o. female  Chief Complaint  Patient presents with   sick    Patient stated she thinks she has a sinus infection, she has a lot of mucous(green), sore throat, chills, and no fever or body aches. She has a headache as well, the symptoms have last for 4 days.   UPPER RESPIRATORY TRACT INFECTION Worst symptom: symptoms for the last 4 days Fever: no Cough: yes Shortness of breath: no Wheezing: no Chest pain:no Chest tightness: no Chest congestion: yes Nasal congestion: yes Runny nose: yes Post nasal drip: yes Sneezing: yes Sore throat: yes Swollen glands: no Sinus pressure: yes Headache: yes Face pain: yes Toothache: no Ear pain: no bilateral Ear pressure: yesbilateral Eyes red/itching:no Eye drainage/crusting: no  Vomiting: no Rash: no Fatigue: yes Sick contacts: no Strep contacts: no  Context: better Recurrent sinusitis: no Relief with OTC cold/cough medications: no  Treatments attempted: anti-histamine cold and sinus   Relevant past medical, surgical, family and social history reviewed and updated as indicated. Interim medical history since our last visit reviewed. Allergies and medications reviewed and updated.  Review of Systems  Constitutional:  Positive for fatigue. Negative for fever.  HENT:  Positive for congestion, postnasal drip, rhinorrhea, sinus pressure, sinus pain, sneezing and sore throat. Negative for dental problem and ear pain.   Respiratory:  Positive for cough. Negative for shortness of breath and wheezing.   Cardiovascular:  Negative for chest pain.  Gastrointestinal:  Negative for vomiting.   Skin:  Negative for rash.  Neurological:  Positive for headaches.    Per HPI unless specifically indicated above     Objective:    BP 125/71 (BP Location: Left Arm, Patient Position: Sitting, Cuff Size: Normal)   Pulse 66   Temp 97.7 F (36.5 C) (Oral)   Ht 5' 7.01 (1.702 m)   Wt 140 lb 9.6 oz (63.8 kg)   LMP 06/30/1991   SpO2 96%   BMI 22.02 kg/m   Wt Readings from Last 3 Encounters:  05/03/24 140 lb 9.6 oz (63.8 kg)  03/16/24 139 lb 3.2 oz (63.1 kg)  02/17/24 140 lb 12.8 oz (63.9 kg)    Physical Exam Vitals and nursing note reviewed.  Constitutional:      General: She is not in acute distress.    Appearance: Normal appearance. She is normal weight. She is not ill-appearing, toxic-appearing or diaphoretic.  HENT:     Head: Normocephalic.     Right Ear: External ear normal. A middle ear effusion is present.     Left Ear: External ear normal. A middle ear effusion is present.     Nose: Congestion and rhinorrhea present.     Mouth/Throat:     Mouth: Mucous membranes are moist.     Pharynx: Oropharynx is clear. Posterior oropharyngeal erythema present. No oropharyngeal exudate.  Eyes:     General:        Right eye: No discharge.        Left eye: No discharge.  Extraocular Movements: Extraocular movements intact.     Conjunctiva/sclera: Conjunctivae normal.     Pupils: Pupils are equal, round, and reactive to light.  Cardiovascular:     Rate and Rhythm: Normal rate and regular rhythm.     Heart sounds: No murmur heard. Pulmonary:     Effort: Pulmonary effort is normal. No respiratory distress.     Breath sounds: Normal breath sounds. No wheezing or rales.  Musculoskeletal:     Cervical back: Normal range of motion and neck supple.  Skin:    General: Skin is warm and dry.     Capillary Refill: Capillary refill takes less than 2 seconds.  Neurological:     General: No focal deficit present.     Mental Status: She is alert and oriented to person, place, and  time. Mental status is at baseline.  Psychiatric:        Mood and Affect: Mood normal.        Behavior: Behavior normal.        Thought Content: Thought content normal.        Judgment: Judgment normal.     Results for orders placed or performed in visit on 03/16/24  CBC with Differential/Platelet   Collection Time: 03/16/24  8:57 AM  Result Value Ref Range   WBC 3.3 (L) 3.4 - 10.8 x10E3/uL   RBC 4.34 3.77 - 5.28 x10E6/uL   Hemoglobin 13.2 11.1 - 15.9 g/dL   Hematocrit 59.0 65.9 - 46.6 %   MCV 94 79 - 97 fL   MCH 30.4 26.6 - 33.0 pg   MCHC 32.3 31.5 - 35.7 g/dL   RDW 86.7 88.2 - 84.5 %   Platelets 191 150 - 450 x10E3/uL   Neutrophils 51 Not Estab. %   Lymphs 30 Not Estab. %   Monocytes 15 Not Estab. %   Eos 3 Not Estab. %   Basos 1 Not Estab. %   Neutrophils Absolute 1.7 1.4 - 7.0 x10E3/uL   Lymphocytes Absolute 1.0 0.7 - 3.1 x10E3/uL   Monocytes Absolute 0.5 0.1 - 0.9 x10E3/uL   EOS (ABSOLUTE) 0.1 0.0 - 0.4 x10E3/uL   Basophils Absolute 0.0 0.0 - 0.2 x10E3/uL   Immature Granulocytes 0 Not Estab. %   Immature Grans (Abs) 0.0 0.0 - 0.1 x10E3/uL  Comprehensive metabolic panel with GFR   Collection Time: 03/16/24  8:57 AM  Result Value Ref Range   Glucose 98 70 - 99 mg/dL   BUN 18 8 - 27 mg/dL   Creatinine, Ser 9.23 0.57 - 1.00 mg/dL   eGFR 80 >40 fO/fpw/8.26   BUN/Creatinine Ratio 24 12 - 28   Sodium 142 134 - 144 mmol/L   Potassium 3.8 3.5 - 5.2 mmol/L   Chloride 103 96 - 106 mmol/L   CO2 26 20 - 29 mmol/L   Calcium  9.1 8.7 - 10.3 mg/dL   Total Protein 6.3 6.0 - 8.5 g/dL   Albumin 4.1 3.8 - 4.8 g/dL   Globulin, Total 2.2 1.5 - 4.5 g/dL   Bilirubin Total 0.5 0.0 - 1.2 mg/dL   Alkaline Phosphatase 58 49 - 135 IU/L   AST 26 0 - 40 IU/L   ALT 15 0 - 32 IU/L  Lipid panel   Collection Time: 03/16/24  8:57 AM  Result Value Ref Range   Cholesterol, Total 165 100 - 199 mg/dL   Triglycerides 65 0 - 149 mg/dL   HDL 77 >60 mg/dL   VLDL Cholesterol Cal 13 5 - 40  mg/dL    LDL Chol Calc (NIH) 75 0 - 99 mg/dL   Chol/HDL Ratio 2.1 0.0 - 4.4 ratio  TSH   Collection Time: 03/16/24  8:57 AM  Result Value Ref Range   TSH 1.600 0.450 - 4.500 uIU/mL      Assessment & Plan:   Problem List Items Addressed This Visit   None Visit Diagnoses       Acute non-recurrent frontal sinusitis    -  Primary   Will treat with Augmentin . Complete course of medication. okay to use OTC symptom management.  Flu/Covid neg in office today.   Relevant Medications   amoxicillin -clavulanate (AUGMENTIN ) 875-125 MG tablet     Sore throat       Relevant Orders   Veritor Flu A/B Waived     Acute cough       Relevant Orders   POC COVID-19        Follow up plan: No follow-ups on file.      "

## 2024-05-03 NOTE — Telephone Encounter (Signed)
 FYI Only or Action Required?: FYI only for provider: home care advice provided.  Patient was last seen in primary care on 03/16/2024 by Connie Pao, NP.  Called Nurse Triage reporting Sinusitis.  Symptoms began couple to few days ago.  Interventions attempted: OTC medications: Nyquil, [employer] brand allergy relief, saline on Q tip.  Symptoms are: stable.  Triage Disposition: Home Care  Patient/caregiver understands and will follow disposition?: Yes      Message from [facility] sent at 05/03/2024  8:03 AM EST  Reason for Triage: Discolored phlegm, possible sinus infection, worsening   Reason for Disposition  [1] Sinus congestion as part of a cold AND [2] present < 10 days  Answer Assessment - Initial Assessment Questions Patient is asking if she is contagious, advised since we can not diagnose without seeing a provider that she can come in and offered appt. Informed patient for symptoms we advise home care and due to cough and URI symptoms to stay home and quarantine from people until she feels better. 24 hours without a fever (without fever reducing medication) and feeling better and to wear a mask in public for 5 days. Also advised to take a home COVID and flu test to confirm or come in the office to be tested. Patient okay with home care advice and will call back if symptoms persist or worsen.   1. LOCATION: Where does it hurt?      Nose and across cheeks.  2. ONSET: When did the sinus pain start?  (e.g., hours, days)      Few days.  3. SEVERITY: How bad is the pain?   (Scale 0-10; or none, mild, moderate or severe)     No pain at this time.  4. RECURRENT SYMPTOM: Have you ever had sinus problems before? If Yes, ask: When was the last time? and What happened that time?      Yes, I've had drainage but not this kind where I can't sleep and stuff.  5. NASAL CONGESTION: Is the nose blocked? If Yes, ask: Can you open it or must you breathe through your  mouth?     Yes, states yesterday and into the evening both sides of nose were completely stopped up  6. NASAL DISCHARGE: Do you have discharge from your nose? If so ask, What color?     Yes, green and bloody.  7. FEVER: Do you have a fever? If Yes, ask: What is it, how was it measured, and when did it start?      No.  8. OTHER SYMPTOMS: Do you have any other symptoms? (e.g., sore throat, cough, earache, difficulty breathing)     Runny nose currently, cough, post nasal drip, sore throat. No SOB, fever, recent antibiotics or sinus infection, earaches, redness and swelling of face.  Protocols used: Sinus Pain or Congestion-A-AH

## 2024-05-11 ENCOUNTER — Ambulatory Visit

## 2024-06-05 ENCOUNTER — Ambulatory Visit: Admit: 2024-06-05 | Admitting: Gastroenterology

## 2024-09-14 ENCOUNTER — Ambulatory Visit: Admitting: Nurse Practitioner
